# Patient Record
Sex: Female | Born: 1935 | Race: White | Hispanic: No | Smoking: Former smoker
Health system: Southern US, Community
[De-identification: ages and names within clinical notes are randomized; demographics above are authoritative.]

## PROBLEM LIST (undated history)

## (undated) DIAGNOSIS — E162 Hypoglycemia, unspecified: Secondary | ICD-10-CM

## (undated) DIAGNOSIS — I10 Essential (primary) hypertension: Secondary | ICD-10-CM

## (undated) DIAGNOSIS — IMO0002 Reserved for concepts with insufficient information to code with codable children: Secondary | ICD-10-CM

## (undated) DIAGNOSIS — C50919 Malignant neoplasm of unspecified site of unspecified female breast: Secondary | ICD-10-CM

## (undated) DIAGNOSIS — I480 Paroxysmal atrial fibrillation: Secondary | ICD-10-CM

---

## 2004-04-14 ENCOUNTER — Ambulatory Visit: Payer: Self-pay | Admitting: Internal Medicine

## 2005-04-18 ENCOUNTER — Ambulatory Visit: Payer: Self-pay | Admitting: Internal Medicine

## 2006-06-14 ENCOUNTER — Ambulatory Visit: Payer: Self-pay | Admitting: Internal Medicine

## 2007-04-04 DIAGNOSIS — IMO0001 Reserved for inherently not codable concepts without codable children: Secondary | ICD-10-CM

## 2007-04-04 HISTORY — PX: BREAST BIOPSY: SHX20

## 2007-04-04 HISTORY — DX: Reserved for inherently not codable concepts without codable children: IMO0001

## 2007-07-11 ENCOUNTER — Ambulatory Visit: Payer: Self-pay | Admitting: Internal Medicine

## 2007-07-19 ENCOUNTER — Ambulatory Visit: Payer: Self-pay | Admitting: Internal Medicine

## 2007-08-06 ENCOUNTER — Ambulatory Visit: Payer: Self-pay | Admitting: Surgery

## 2007-08-12 ENCOUNTER — Ambulatory Visit: Payer: Self-pay | Admitting: Surgery

## 2007-09-02 ENCOUNTER — Ambulatory Visit: Payer: Self-pay | Admitting: Oncology

## 2007-09-02 ENCOUNTER — Ambulatory Visit: Payer: Self-pay | Admitting: Radiation Oncology

## 2007-10-02 ENCOUNTER — Ambulatory Visit: Payer: Self-pay | Admitting: Oncology

## 2007-10-02 ENCOUNTER — Ambulatory Visit: Payer: Self-pay | Admitting: Radiation Oncology

## 2007-11-02 ENCOUNTER — Ambulatory Visit: Payer: Self-pay | Admitting: Oncology

## 2007-11-02 ENCOUNTER — Ambulatory Visit: Payer: Self-pay | Admitting: Radiation Oncology

## 2007-12-03 ENCOUNTER — Ambulatory Visit: Payer: Self-pay | Admitting: Oncology

## 2007-12-10 ENCOUNTER — Ambulatory Visit: Payer: Self-pay | Admitting: Oncology

## 2007-12-11 ENCOUNTER — Other Ambulatory Visit: Payer: Self-pay

## 2007-12-11 ENCOUNTER — Ambulatory Visit: Payer: Self-pay | Admitting: Ophthalmology

## 2007-12-23 ENCOUNTER — Ambulatory Visit: Payer: Self-pay | Admitting: Ophthalmology

## 2008-01-02 ENCOUNTER — Ambulatory Visit: Payer: Self-pay | Admitting: Oncology

## 2008-02-02 ENCOUNTER — Ambulatory Visit: Payer: Self-pay | Admitting: Oncology

## 2008-03-03 ENCOUNTER — Ambulatory Visit: Payer: Self-pay | Admitting: Oncology

## 2008-03-10 ENCOUNTER — Ambulatory Visit: Payer: Self-pay | Admitting: Oncology

## 2008-04-03 ENCOUNTER — Ambulatory Visit: Payer: Self-pay | Admitting: Oncology

## 2008-05-04 ENCOUNTER — Ambulatory Visit: Payer: Self-pay | Admitting: Oncology

## 2008-06-01 ENCOUNTER — Ambulatory Visit: Payer: Self-pay | Admitting: Oncology

## 2008-06-10 ENCOUNTER — Ambulatory Visit: Payer: Self-pay | Admitting: Oncology

## 2008-07-02 ENCOUNTER — Ambulatory Visit: Payer: Self-pay | Admitting: Oncology

## 2008-07-07 ENCOUNTER — Ambulatory Visit: Payer: Self-pay | Admitting: Ophthalmology

## 2008-07-13 ENCOUNTER — Ambulatory Visit: Payer: Self-pay | Admitting: Internal Medicine

## 2008-09-01 ENCOUNTER — Ambulatory Visit: Payer: Self-pay | Admitting: Oncology

## 2008-09-15 ENCOUNTER — Ambulatory Visit: Payer: Self-pay | Admitting: Oncology

## 2008-10-01 ENCOUNTER — Ambulatory Visit: Payer: Self-pay | Admitting: Oncology

## 2008-12-02 ENCOUNTER — Ambulatory Visit: Payer: Self-pay | Admitting: Oncology

## 2008-12-15 ENCOUNTER — Ambulatory Visit: Payer: Self-pay | Admitting: Oncology

## 2009-01-01 ENCOUNTER — Ambulatory Visit: Payer: Self-pay | Admitting: Oncology

## 2009-02-01 ENCOUNTER — Ambulatory Visit: Payer: Self-pay | Admitting: Oncology

## 2009-03-03 ENCOUNTER — Ambulatory Visit: Payer: Self-pay | Admitting: Oncology

## 2009-03-11 ENCOUNTER — Ambulatory Visit: Payer: Self-pay | Admitting: Oncology

## 2009-04-03 ENCOUNTER — Ambulatory Visit: Payer: Self-pay | Admitting: Oncology

## 2009-06-01 ENCOUNTER — Ambulatory Visit: Payer: Self-pay | Admitting: Oncology

## 2009-06-09 ENCOUNTER — Ambulatory Visit: Payer: Self-pay | Admitting: Oncology

## 2009-07-02 ENCOUNTER — Ambulatory Visit: Payer: Self-pay | Admitting: Oncology

## 2009-07-19 ENCOUNTER — Ambulatory Visit: Payer: Self-pay | Admitting: Surgery

## 2009-09-01 ENCOUNTER — Ambulatory Visit: Payer: Self-pay | Admitting: Oncology

## 2009-09-07 ENCOUNTER — Ambulatory Visit: Payer: Self-pay | Admitting: Oncology

## 2009-10-01 ENCOUNTER — Ambulatory Visit: Payer: Self-pay | Admitting: Oncology

## 2010-03-14 ENCOUNTER — Ambulatory Visit: Payer: Self-pay | Admitting: Oncology

## 2010-03-15 LAB — CANCER ANTIGEN 27.29: CA 27.29: 59.4 U/mL — ABNORMAL HIGH (ref 0.0–38.6)

## 2010-04-03 ENCOUNTER — Ambulatory Visit: Payer: Self-pay | Admitting: Oncology

## 2010-05-04 ENCOUNTER — Ambulatory Visit: Payer: Self-pay | Admitting: Oncology

## 2010-06-14 ENCOUNTER — Ambulatory Visit: Payer: Self-pay | Admitting: Oncology

## 2010-07-03 ENCOUNTER — Ambulatory Visit: Payer: Self-pay | Admitting: Oncology

## 2010-07-21 ENCOUNTER — Ambulatory Visit: Payer: Self-pay | Admitting: Surgery

## 2010-09-13 ENCOUNTER — Ambulatory Visit: Payer: Self-pay | Admitting: Oncology

## 2010-10-02 ENCOUNTER — Ambulatory Visit: Payer: Self-pay | Admitting: Oncology

## 2011-03-14 ENCOUNTER — Ambulatory Visit: Payer: Self-pay | Admitting: Oncology

## 2011-04-04 ENCOUNTER — Ambulatory Visit: Payer: Self-pay | Admitting: Oncology

## 2011-05-05 ENCOUNTER — Ambulatory Visit: Payer: Self-pay | Admitting: Oncology

## 2011-07-24 ENCOUNTER — Ambulatory Visit: Payer: Self-pay | Admitting: Oncology

## 2011-08-10 ENCOUNTER — Inpatient Hospital Stay: Payer: Self-pay | Admitting: Orthopedic Surgery

## 2011-08-10 LAB — URINALYSIS, COMPLETE
Bacteria: NONE SEEN
Bilirubin,UR: NEGATIVE
Hyaline Cast: 1
Nitrite: NEGATIVE
Ph: 5 (ref 4.5–8.0)
Protein: 30
RBC,UR: 3 /HPF (ref 0–5)
Squamous Epithelial: NONE SEEN
WBC UR: 1 /HPF (ref 0–5)

## 2011-08-10 LAB — COMPREHENSIVE METABOLIC PANEL
Albumin: 3.9 g/dL (ref 3.4–5.0)
Alkaline Phosphatase: 99 U/L (ref 50–136)
Anion Gap: 12 (ref 7–16)
Bilirubin,Total: 0.6 mg/dL (ref 0.2–1.0)
EGFR (African American): >60
Glucose: 141 mg/dL — ABNORMAL HIGH (ref 65–99)
Osmolality: 286 (ref 275–301)
Potassium: 4.4 mmol/L (ref 3.5–5.1)
SGPT (ALT): 33 U/L
Total Protein: 7.1 g/dL (ref 6.4–8.2)

## 2011-08-10 LAB — CBC
HCT: 37.5 % (ref 35.0–47.0)
HGB: 13.1 g/dL (ref 12.0–16.0)
MCHC: 34.8 g/dL (ref 32.0–36.0)
Platelet: 201 10*3/uL (ref 150–440)
RBC: 4.03 10*6/uL (ref 3.80–5.20)
WBC: 11.1 10*3/uL — ABNORMAL HIGH (ref 3.6–11.0)

## 2011-08-10 LAB — TROPONIN I: Troponin-I: <0.02 ng/mL

## 2011-08-11 LAB — CBC WITH DIFFERENTIAL/PLATELET
Basophil #: 0 10*3/uL (ref 0.0–0.1)
Basophil %: 0.3 %
Eosinophil #: 0.2 10*3/uL (ref 0.0–0.7)
HCT: 33.7 % — ABNORMAL LOW (ref 35.0–47.0)
HGB: 11.8 g/dL — ABNORMAL LOW (ref 12.0–16.0)
MCHC: 35.1 g/dL (ref 32.0–36.0)
MCV: 93 fL (ref 80–100)
Monocyte #: 0.5 x10 3/mm (ref 0.2–0.9)
Monocyte %: 6.6 %
Neutrophil #: 6.6 10*3/uL — ABNORMAL HIGH (ref 1.4–6.5)
Neutrophil %: 79.6 %
Platelet: 169 10*3/uL (ref 150–440)
RBC: 3.62 10*6/uL — ABNORMAL LOW (ref 3.80–5.20)
RDW: 13.1 % (ref 11.5–14.5)
WBC: 8.3 10*3/uL (ref 3.6–11.0)

## 2011-08-11 LAB — BASIC METABOLIC PANEL
Chloride: 105 mmol/L (ref 98–107)
Co2: 28 mmol/L (ref 21–32)
Creatinine: 0.6 mg/dL (ref 0.60–1.30)
EGFR (Non-African Amer.): >60
Osmolality: 278 (ref 275–301)

## 2011-08-12 LAB — CBC WITH DIFFERENTIAL/PLATELET
Basophil #: 0 10*3/uL (ref 0.0–0.1)
Eosinophil #: 0.4 10*3/uL (ref 0.0–0.7)
Eosinophil %: 5.5 %
HCT: 25.7 % — ABNORMAL LOW (ref 35.0–47.0)
Lymphocyte %: 22.6 %
MCH: 32.3 pg (ref 26.0–34.0)
MCHC: 34.5 g/dL (ref 32.0–36.0)
MCV: 94 fL (ref 80–100)
Monocyte #: 0.6 x10 3/mm (ref 0.2–0.9)
Monocyte %: 8.8 %
Neutrophil %: 62.4 %
Platelet: 158 10*3/uL (ref 150–440)
RBC: 2.74 10*6/uL — ABNORMAL LOW (ref 3.80–5.20)
RDW: 12.7 % (ref 11.5–14.5)

## 2011-08-12 LAB — BASIC METABOLIC PANEL
Anion Gap: 8 (ref 7–16)
BUN: 14 mg/dL (ref 7–18)
Chloride: 101 mmol/L (ref 98–107)
Co2: 27 mmol/L (ref 21–32)
Creatinine: 0.69 mg/dL (ref 0.60–1.30)
EGFR (African American): >60
Glucose: 103 mg/dL — ABNORMAL HIGH (ref 65–99)
Osmolality: 273 (ref 275–301)
Potassium: 3.9 mmol/L (ref 3.5–5.1)

## 2011-08-13 LAB — CBC WITH DIFFERENTIAL/PLATELET
Basophil #: 0 10*3/uL (ref 0.0–0.1)
Eosinophil #: 0.3 10*3/uL (ref 0.0–0.7)
HGB: 8 g/dL — ABNORMAL LOW (ref 12.0–16.0)
Lymphocyte %: 18 %
MCH: 32.3 pg (ref 26.0–34.0)
MCHC: 34.3 g/dL (ref 32.0–36.0)
MCV: 94 fL (ref 80–100)
Monocyte %: 8.9 %
Neutrophil %: 66.2 %
RBC: 2.48 10*6/uL — ABNORMAL LOW (ref 3.80–5.20)
WBC: 5.4 10*3/uL (ref 3.6–11.0)

## 2011-08-14 LAB — CBC WITH DIFFERENTIAL/PLATELET
Basophil #: 0 10*3/uL (ref 0.0–0.1)
Basophil %: 0.5 %
Eosinophil #: 0.3 10*3/uL (ref 0.0–0.7)
Eosinophil %: 5.4 %
HCT: 24.4 % — ABNORMAL LOW (ref 35.0–47.0)
HGB: 8.5 g/dL — ABNORMAL LOW (ref 12.0–16.0)
Lymphocyte #: 1.4 10*3/uL (ref 1.0–3.6)
Lymphocyte %: 24.6 %
MCHC: 34.8 g/dL (ref 32.0–36.0)
MCV: 94 fL (ref 80–100)
Monocyte #: 0.5 x10 3/mm (ref 0.2–0.9)
Monocyte %: 8.8 %
Neutrophil #: 3.5 10*3/uL (ref 1.4–6.5)
Neutrophil %: 60.7 %
Platelet: 224 10*3/uL (ref 150–440)
RDW: 13.3 % (ref 11.5–14.5)

## 2011-08-14 LAB — BASIC METABOLIC PANEL
Chloride: 102 mmol/L (ref 98–107)
Co2: 29 mmol/L (ref 21–32)
Creatinine: 0.51 mg/dL — ABNORMAL LOW (ref 0.60–1.30)
EGFR (African American): >60
EGFR (Non-African Amer.): >60
Osmolality: 274 (ref 275–301)
Potassium: 4.1 mmol/L (ref 3.5–5.1)
Sodium: 138 mmol/L (ref 136–145)

## 2011-08-15 LAB — CBC WITH DIFFERENTIAL/PLATELET
Eosinophil %: 4.9 %
HGB: 9.7 g/dL — ABNORMAL LOW (ref 12.0–16.0)
MCH: 32 pg (ref 26.0–34.0)
MCV: 95 fL (ref 80–100)
Monocyte #: 0.7 x10 3/mm (ref 0.2–0.9)
Monocyte %: 8.1 %
Neutrophil #: 5.3 10*3/uL (ref 1.4–6.5)
Neutrophil %: 59.7 %
Platelet: 337 10*3/uL (ref 150–440)
WBC: 9 10*3/uL (ref 3.6–11.0)

## 2011-08-30 LAB — URINE CULTURE

## 2012-04-03 ENCOUNTER — Ambulatory Visit: Payer: Self-pay | Admitting: Oncology

## 2012-05-04 ENCOUNTER — Ambulatory Visit: Payer: Self-pay | Admitting: Oncology

## 2012-06-01 ENCOUNTER — Ambulatory Visit: Payer: Self-pay | Admitting: Oncology

## 2012-07-24 ENCOUNTER — Ambulatory Visit: Payer: Self-pay | Admitting: Oncology

## 2012-11-01 ENCOUNTER — Ambulatory Visit: Payer: Self-pay | Admitting: Oncology

## 2012-11-20 LAB — CANCER ANTIGEN 27.29: CA 27.29: 35.5 U/mL (ref 0.0–38.6)

## 2012-12-02 ENCOUNTER — Ambulatory Visit: Payer: Self-pay | Admitting: Oncology

## 2013-04-29 IMAGING — CR RIGHT HIP - 1 VIEW
1 series · 2 of 2 positions shown · non-contrast
Comparison: none

REASON FOR EXAM: lateral of right hip.  Need to see the femoral head
around TIP of screw.  Previo
COMMENTS:

[Series 9: w hip lat · 0.14mm/px · 2 of 2 slices shown]
[im 1/2]
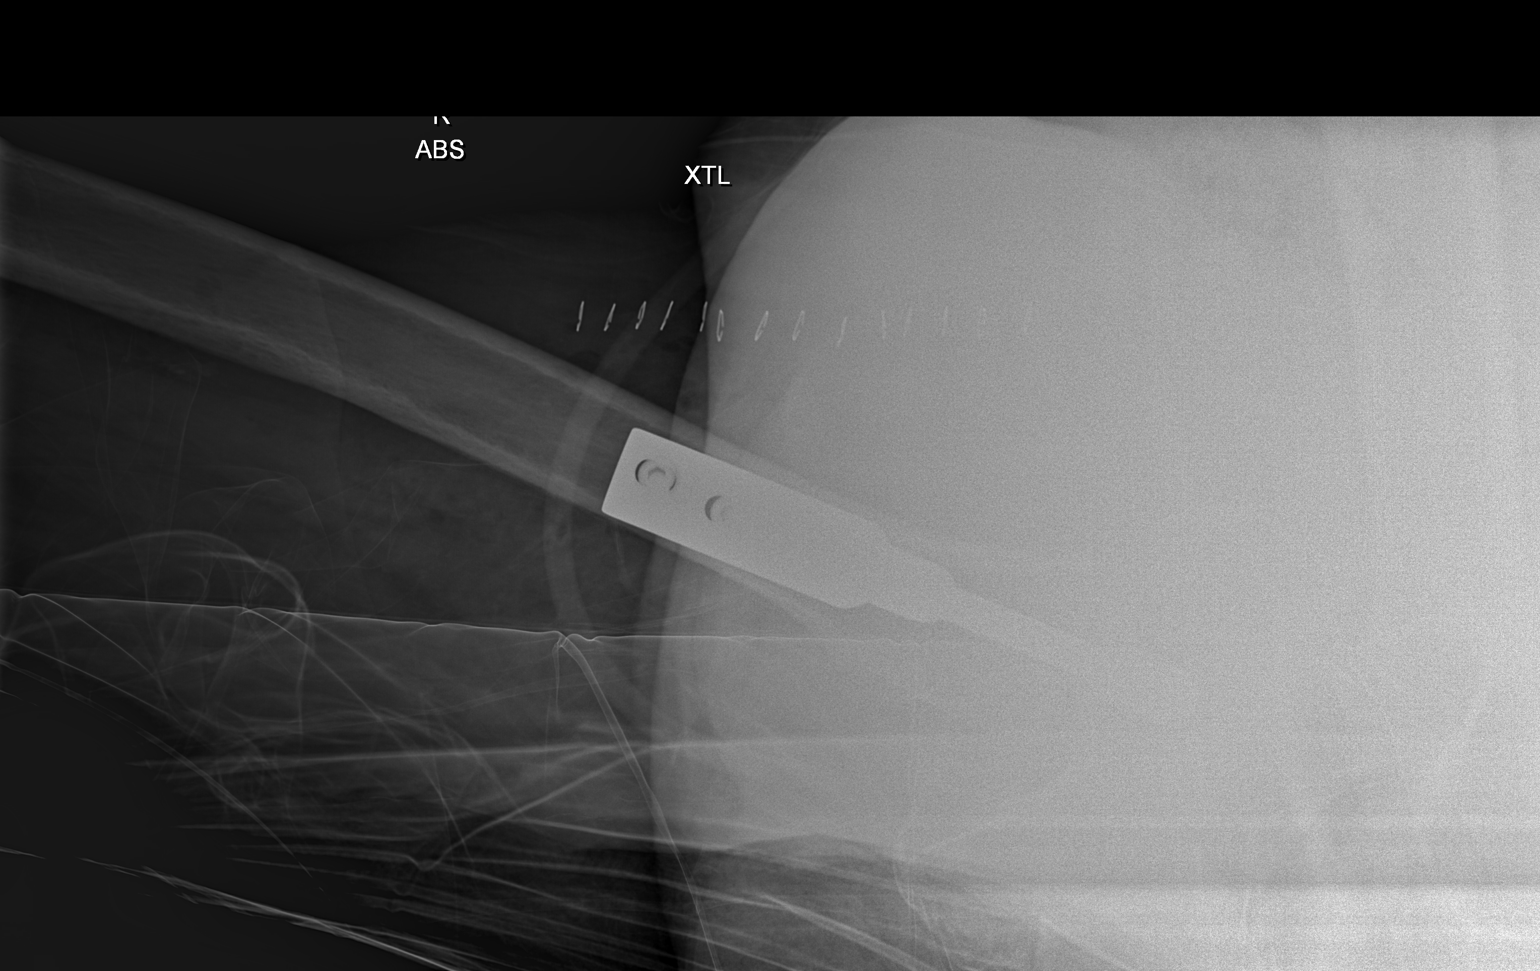
[im 2/2]
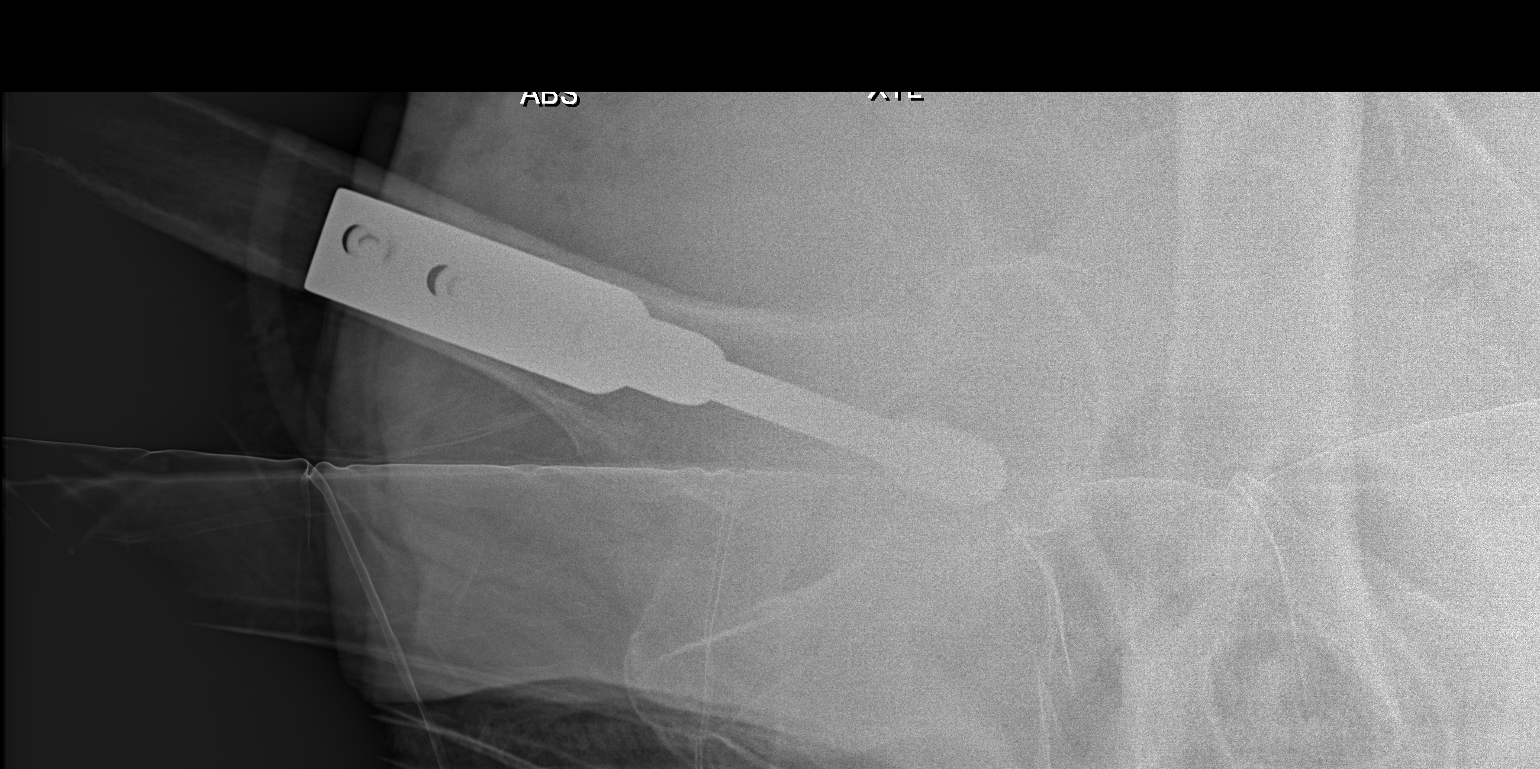

[2 of 2 positions shown; findings below may reference images not displayed]

PROCEDURE:     DXR - DXR HIP RIGHT ONE VIEW  - August 11, 2011  [DATE]

RESULT:     Crosstable lateral view of the right knee demonstrates the
patient is status post arthroplasty. Surgical skin staples are present. The
crosstable lateral images show no definite immediate postoperative bone or
hardware complication. The femoral head component is slightly posterior from
midline on this projection.
IMPRESSION: Please see above.

[REDACTED]

## 2013-04-29 IMAGING — CR DG C-ARM 1-60 MIN
1 series · 3 of 3 positions shown · non-contrast
Comparison: none

REASON FOR EXAM: ORIF
COMMENTS:

PROCEDURE:     DXR - DXR C-ARM WITH 2 VIEWS RT HIP  - August 11, 2011  [DATE]
RESULT:     Comparison: AP pelvis 08/10/2011

[Series 6001: (person_name) · 3 of 3 slices shown]
[im 1/3]
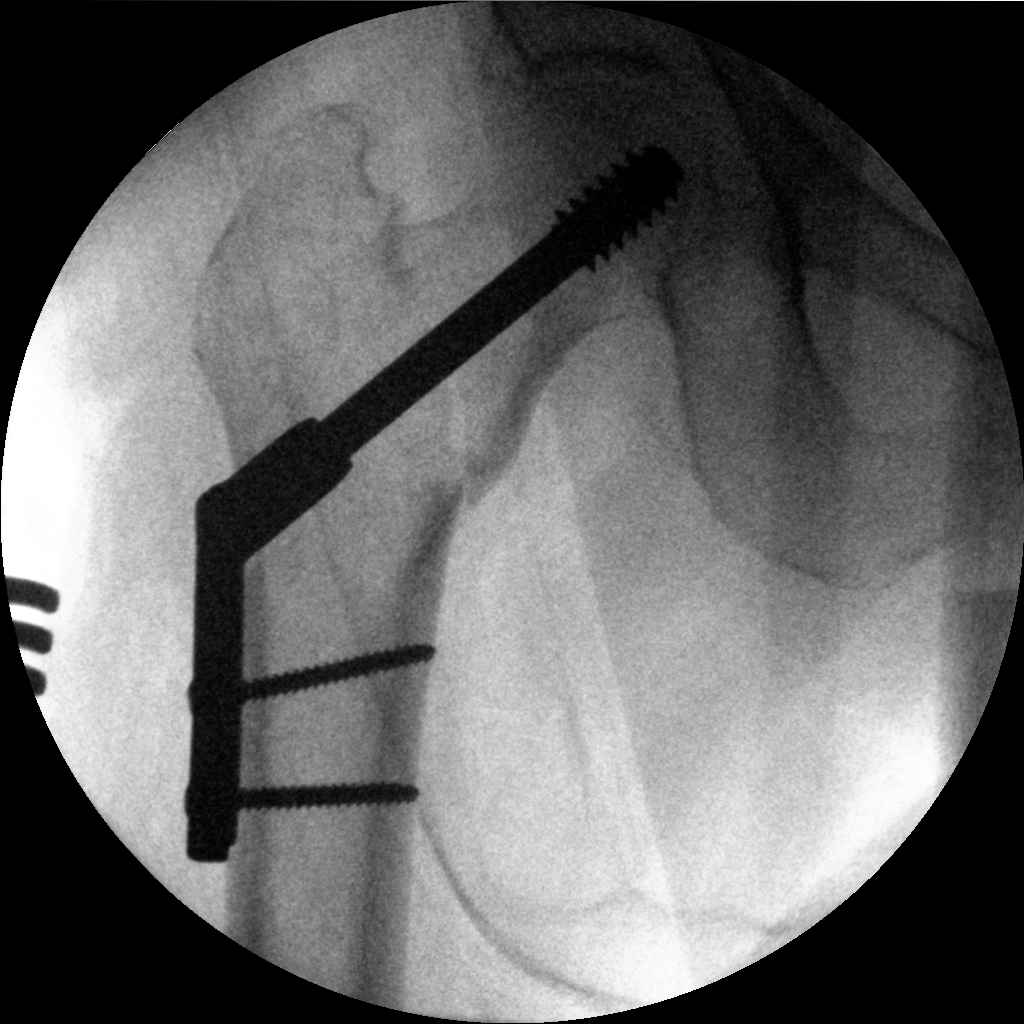
[im 2/3]
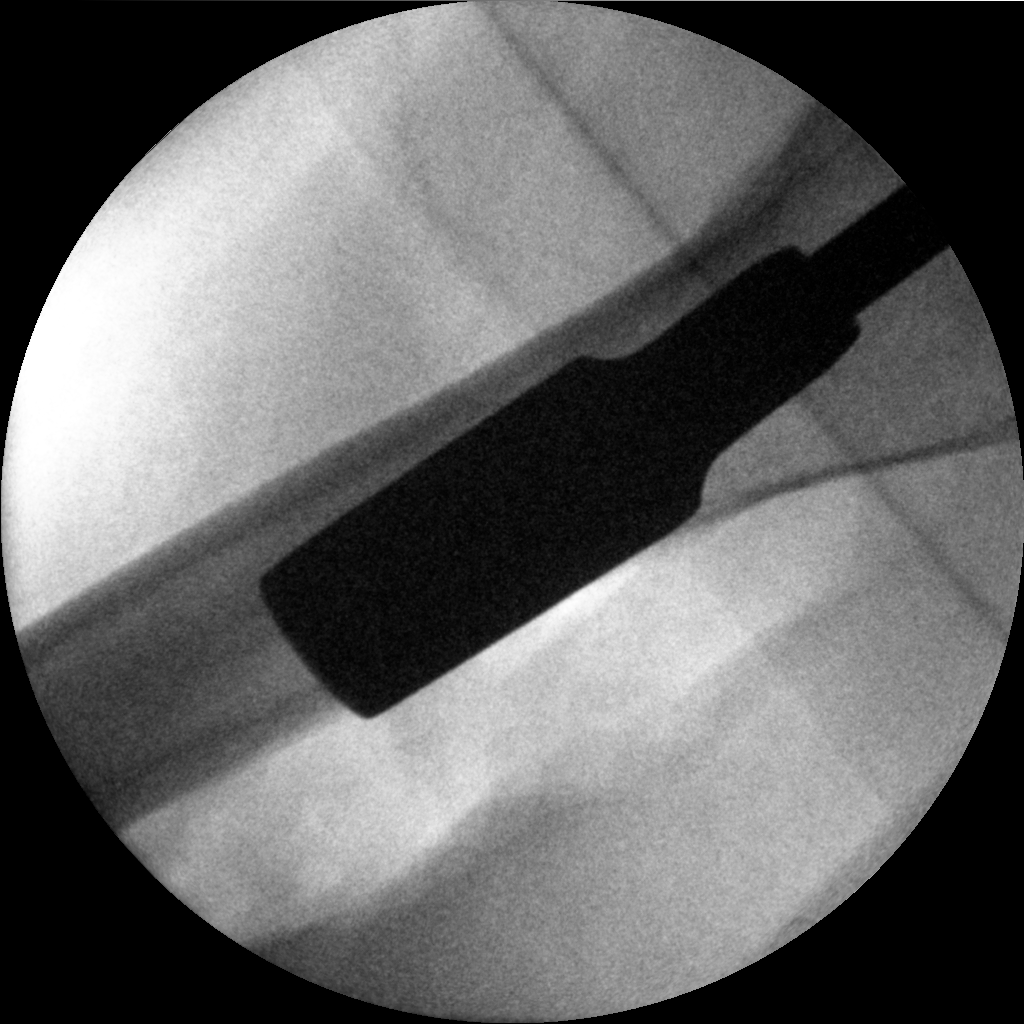
[im 3/3]
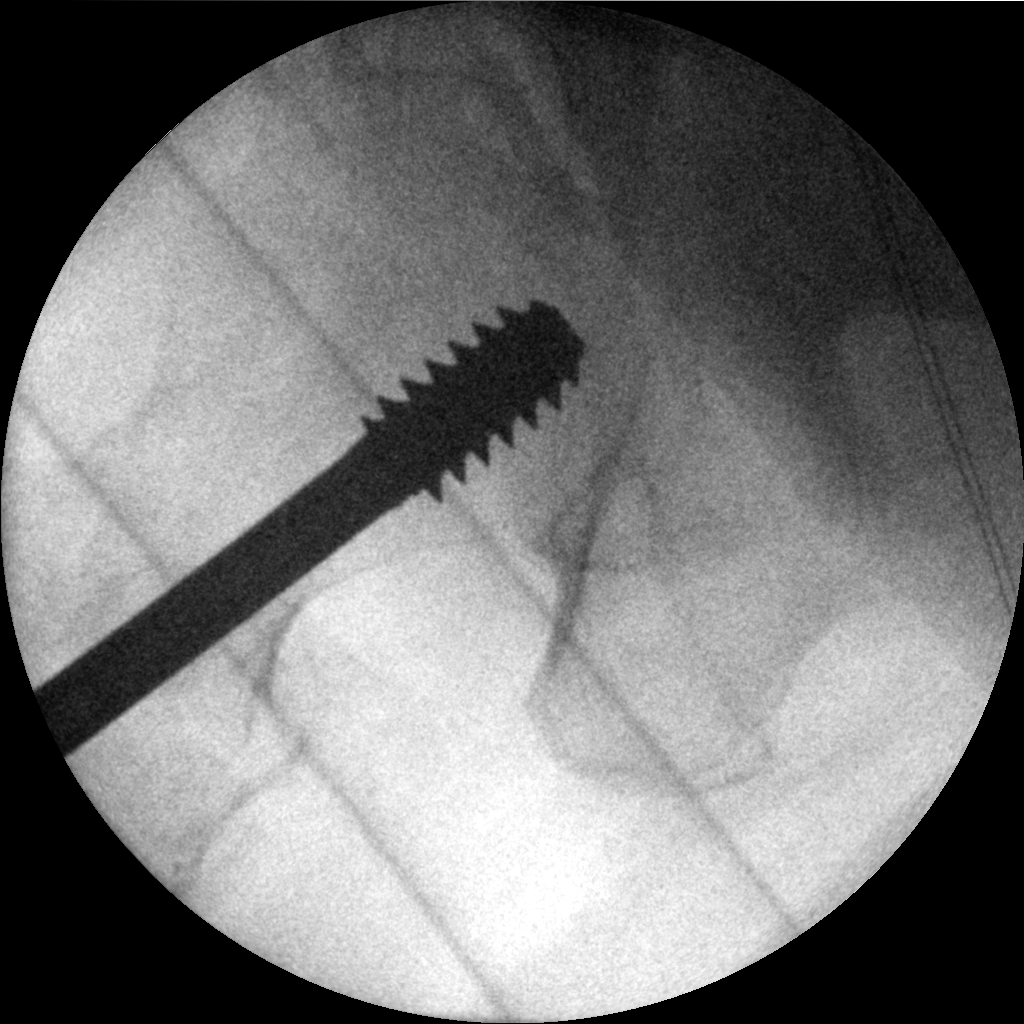

[3 of 3 positions shown; findings below may reference images not displayed]

FINDINGS: Three fluoroscopic images were submitted from intraoperative procedure.
Dynamic compression screw and cortical fixation plate and screws are seen
traversing the intertrochanteric fracture of the right hip. There is
improved alignment of the fracture. The remainder of the interpretation is
deferred to the performing clinician.
IMPRESSION: Please see above.

## 2013-04-29 IMAGING — CR RIGHT HIP - 1 VIEW
1 series · 1 of 1 positions shown · non-contrast
Comparison: none

REASON FOR EXAM: post op
COMMENTS:   Bedside (portable):Y

PROCEDURE:     DXR - DXR HIP RIGHT ONE VIEW  - August 11, 2011  [DATE]
RESULT:     Comparison: Right hip radiograph 08/10/2011

[lat]
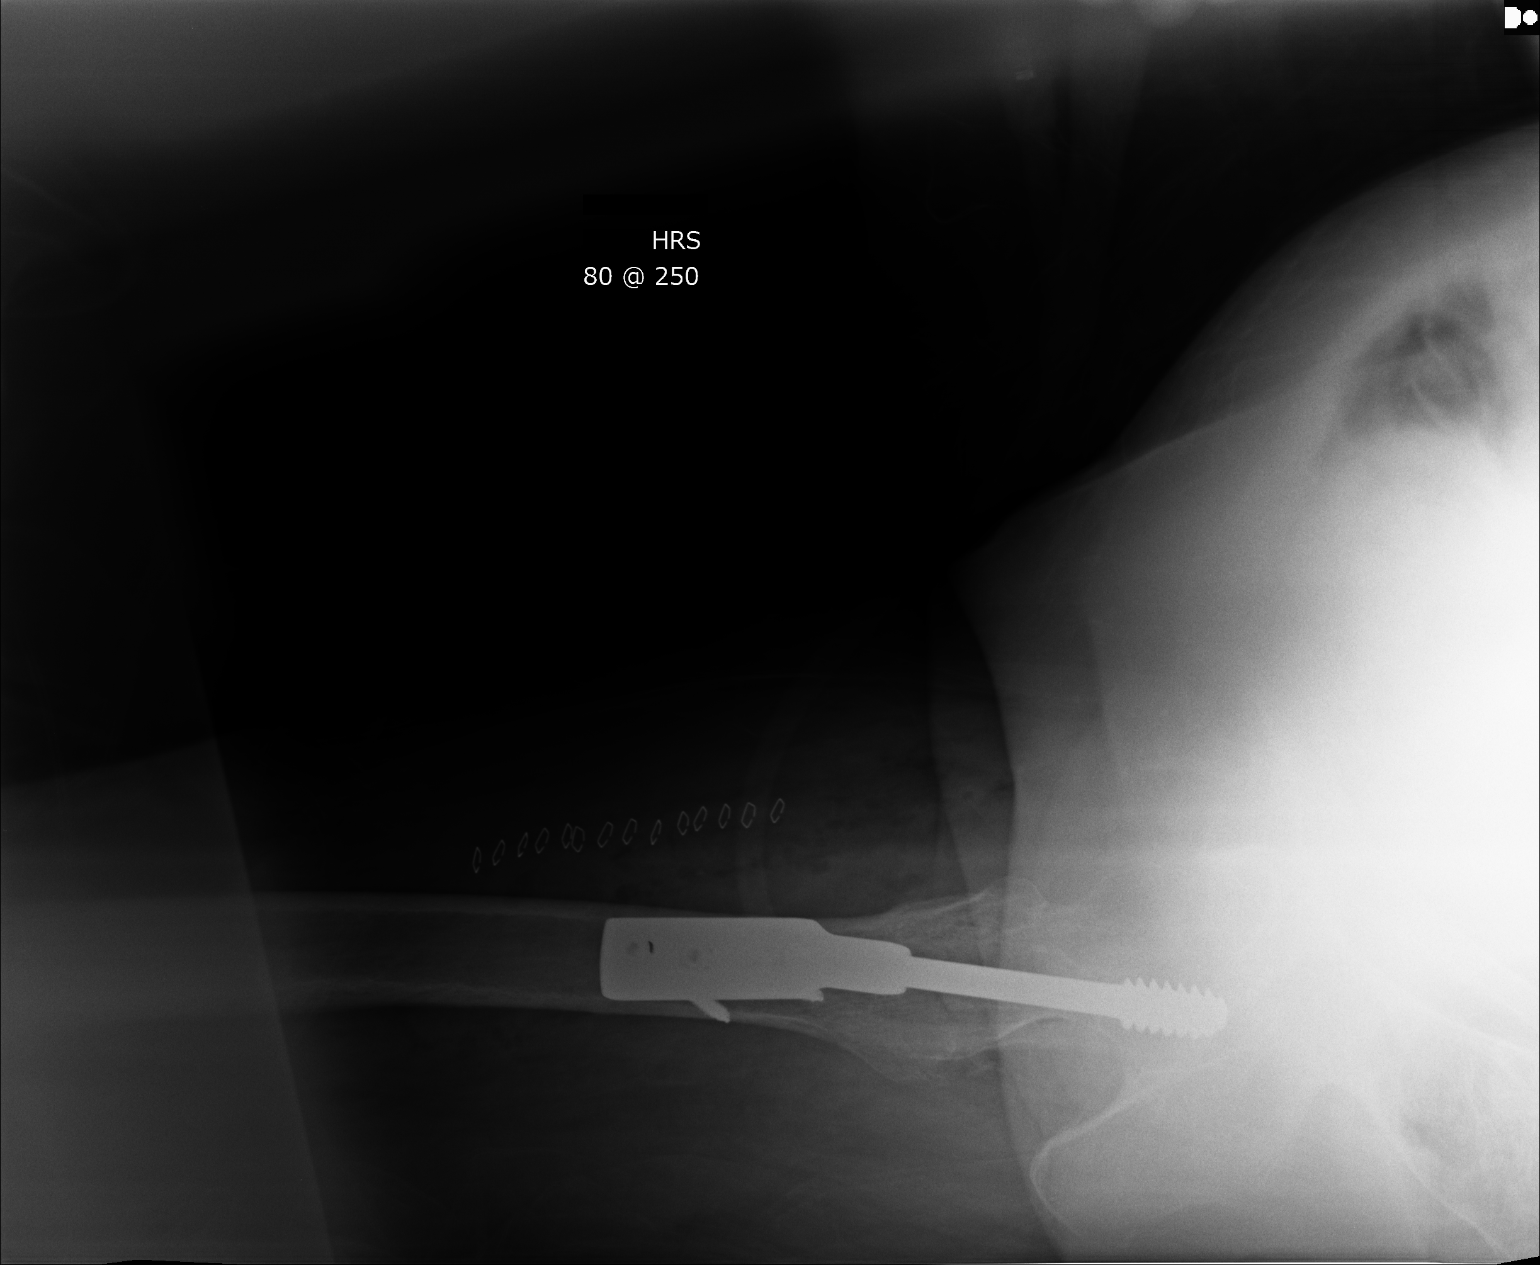

[1 of 1 positions shown; findings below may reference images not displayed]

FINDINGS: Evaluation limited by single view. Dynamic compression screw and cortical
fixation plate and screws seen traversing the femoral neck. Skin staples are
present. Subcutaneous gas is likely postoperative.
IMPRESSION: ORIF of right hip fracture.

## 2013-07-31 ENCOUNTER — Ambulatory Visit: Payer: Self-pay | Admitting: Oncology

## 2013-11-27 ENCOUNTER — Ambulatory Visit: Payer: Self-pay | Admitting: Oncology

## 2013-11-28 LAB — CANCER ANTIGEN 27.29: CA 27.29: 45.5 U/mL — AB (ref 0.0–38.6)

## 2013-12-02 ENCOUNTER — Ambulatory Visit: Payer: Self-pay | Admitting: Oncology

## 2014-02-19 ENCOUNTER — Ambulatory Visit: Payer: Self-pay | Admitting: Oncology

## 2014-07-17 ENCOUNTER — Other Ambulatory Visit: Payer: Self-pay | Admitting: Internal Medicine

## 2014-07-17 DIAGNOSIS — Z853 Personal history of malignant neoplasm of breast: Secondary | ICD-10-CM

## 2014-07-26 NOTE — H&P (Signed)
Subjective/Chief Complaint Right hip pain    History of Present Illness 81 y/o female presented to the [facility] with right hip pain after falling while trying to use the toilet.  She was unable to stand or bear weight on the right lower leg following the injury.  She denies syncopy or prodromal symptoms.  She denies loss of consciousness or other injuries.  She is an independent ambulator at baseline.   Past Med/Surgical Hx:  Glaucoma:   HTN:   Arthritis:   Diverticulitis:   Breast Cancer:   Hysterectomy:   Colostomy, has been reversed:   ALLERGIES:  Fosamax: Chest Pain, Other  HOME MEDICATIONS: Medication Instructions Status  letrozole tablet 2.5 mg 1 tab(s) orally once a day x 90 days  Active  Peridin-C 1 tab(s)  once a day  Active  Vicks Vapor Rub  1X   as needed at bedtime   Active  Tylenol Extra Strength 2 tab(s)  every 6 hours as needed   Active  Stanback Analgesic 1X  once a day (in the morning)  Active  Aspirin 325 mg.  1 tab(s)  once a day  Active  Spectravite MVI  1 tab(s)  once a day  Active  Calcium 600+D 2 tab(s)  once a day  Active  timolol ophthalmic 0.5% solution 1 gtt drops to each eye once a day (at bedtime)  Active  enalapril 10 mg oral tablet 1 tab(s) orally once a day  Active   Family and Social History:   Family History Non-Contributory    Place of Living Home   Review of Systems:   Subjective/Chief Complaint Right hip pain    Medications/Allergies Reviewed Medications/Allergies reviewed   Physical Exam:   GEN no acute distress    HEENT PERRL, hearing intact to voice, dry oral mucosa, Oropharynx clear    NECK supple  No masses  trachea midline    RESP normal resp effort  clear BS  no use of accessory muscles    CARD regular rate  no murmur  no JVD    ABD denies tenderness  no liver/spleen enlargement  soft  normal BS    GU foley catheter in place    LYMPH negative neck    EXTR negative cyanosis/clubbing, negative edema, Skin intact over  right hip.  No erythema or ecchymosis.  Right leg in Buck's traction.  Motor and sensory function of the right lower extremity intact.  Pedal pulses are palpable.    SKIN normal to palpation    NEURO motor/sensory function intact    PSYCH alert   Routine Chem:  09-May-13 01:48    Glucose, Serum 141   BUN 30   Creatinine (comp) 0.74   Sodium, Serum 139   Potassium, Serum 4.4   Chloride, Serum 103   CO2, Serum 24   Calcium (Total), Serum 10.1  Hepatic:  09-May-13 01:48    Bilirubin, Total 0.6   Alkaline Phosphatase 99   SGPT (ALT) 33   SGOT (AST) 42   Total Protein, Serum 7.1   Albumin, Serum 3.9  Routine Chem:  09-May-13 01:48    Osmolality (calc) 286   eGFR (African American) >60   eGFR (Non-African American) >60   Anion Gap 12  Routine Hem:  09-May-13 01:48    WBC (CBC) -   RBC (CBC) -   Hemoglobin (CBC) -   Hematocrit (CBC) -   Platelet Count (CBC) -   MCV -   MCH -  MCHC -   RDW -  Cardiac:  09-May-13 01:48    Troponin I < 0.02  Routine BB:  09-May-13 01:48    Crossmatch Unit 1 Ready   Crossmatch Unit 2 Ready   Antibody Screen NEGATIVE  Routine Hem:  09-May-13 02:26    WBC (CBC) 11.1   RBC (CBC) 4.03   Hemoglobin (CBC) 13.1   Hematocrit (CBC) 37.5   Platelet Count (CBC) 201   MCV 93   MCH 32.3   MCHC 34.8   RDW 13.1  Routine UA:  09-May-13 02:26    Color (UA) Yellow   Clarity (UA) Clear   Glucose (UA) Negative   Bilirubin (UA) Negative   Ketones (UA) Negative   Specific Gravity (UA) 1.025   Blood (UA) Negative   pH (UA) 5.0   Protein (UA) 30 mg/dL   Nitrite (UA) Negative   Leukocyte Esterase (UA) Negative   RBC (UA) 3 /HPF   WBC (UA) 1 /HPF   Mucous (UA) PRESENT   Hyaline Cast (UA) 1 /LPF   Calcium Oxalate Crystal (UA) PRESENT  Blood Glucose:  09-May-13 10:06    POCT Blood Glucose 90   Radiology Results: XRay:    09-May-13 01:51, Chest 1 View AP or PA   Chest 1 View AP or PA   REASON FOR EXAM:    fx hip  COMMENTS:   May  transport without cardiac monitor    PROCEDURE: DXR - DXR CHEST 1 VIEWAP OR PA  - Aug 10 2011  1:51AM     RESULT: The lungs are clear. The cardiovascular structures are   unremarkable.    IMPRESSION:  No acute abnormality.      Thank you for this opportunity to contribute to the care of your patient.           Verified By: Osa Craver, M.D., MD    09-May-13 01:51, Knee Right AP and Lateral   Knee Right AP and Lateral   REASON FOR EXAM:    fall / rotation  COMMENTS:       PROCEDURE: DXR - DXR KNEE RIGHT AP AND LATERAL  - Aug 10 2011  1:51AM     RESULT: Degenerative changes are noted of the right knee. No evidence of   fracture. Loose bodies may be present. Vascular calcification is noted.     IMPRESSION:  Degenerative changes are noted of the right knee. No acute   abnormality.      Thank you for this opportunity to contribute to the care of your patient.         Verified By: Osa Craver, M.D., MD     Assessment/Admission Diagnosis Right hip intertrochanteric fracture    Plan I explained the diagnosis to the patient and her sister and brother in law who are at the bedside.  I drew a diagram on the white board in the room to help explain the fracture.  I have recommended an ORIF of the right hip fracture.  I explained the surgery in detail to the patient and the post-op course.  The risks and benefits of surgical intervention were discussed in detail with the patient. The patient expressed understanding of the risks and benefits and agreed with plans for surgery.  The risks include, but are not limited to: infection, bleeding requiring transfusion, nerve and blood vessel injury, leg length discrepancy, change in leg roation, malunion, nonunion, hardware failure, persistent pain, need for more surgery including conversion to a  total hip arthroplasty, DVT, and PE, MI, stroke, pneumonia, respiratory failure, and death.  I answered all the patient's questions.  She will  be NPO after midnight.  Patient had been cleared for surgery by the [facility] medical service.  Surgical site signed as per "right site surgery" protocol.  Surgery is scheduled for 10:30 AM tomorrow morning.   Electronic Signatures: Thornton Park (MD)  (Signed 09-May-13 19:05)  Authored: CHIEF COMPLAINT and HISTORY, PAST MEDICAL/SURGIAL HISTORY, ALLERGIES, HOME MEDICATIONS, FAMILY AND SOCIAL HISTORY, REVIEW OF SYSTEMS, PHYSICAL EXAM, LABS, Radiology, ASSESSMENT AND PLAN   Last Updated: 09-May-13 19:05 by Thornton Park (MD)

## 2014-07-26 NOTE — Op Note (Signed)
PATIENT NAME:  Virginia Clements, Virginia Clements MR#:  [id number] DATE OF BIRTH:  [birth year]  DATE OF PROCEDURE:  08/11/2011  PREOPERATIVE DIAGNOSIS: Right intertrochanteric hip fracture.   POSTOPERATIVE DIAGNOSIS: Right intertrochanteric hip fracture.    PROCEDURE: Open reduction and internal fixation right intertrochanteric hip fracture.   SURGEON: Timoteo Gaul, MD  ANESTHESIA: General.  COMPLICATIONS: None.   ESTIMATED BLOOD LOSS: 150 mL.   INDICATIONS FOR PROCEDURE: Patient is a 81 year old female who is an independent ambulator at baseline. She sustained a fall while using the bathroom at home. Given her high level of functioning and the displacement of her fracture it was recommended that the patient undergo open reduction internal fixation. I reviewed the risks and benefits of surgery with the patient. She agreed to the procedure. She understands the risks include infection, bleeding requiring blood transfusion, nerve or blood vessel injury, change in leg lengths, change in lower leg rotation, failure of the hardware, painful hardware, persistent pain in the hip as well as screw cut out and the need for further surgery. Medical complications include deep vein thrombosis and pulmonary embolism, myocardial infarction, stroke, pneumonia, respiratory failure, and death.   DESCRIPTION OF PROCEDURE: Patient is marked over the right hip with the word "yes" according the hospital's right site protocol. She was then brought to the Operating Room where she laid supine on the fracture table and underwent general endotracheal intubation. Her right leg was placed into traction on the fracture table and the left leg was placed in the hemi-lithotomy position. All bony prominences were adequately padded. Patient prepped and draped in sterile fashion.   A timeout was performed to verify the patient's name, date of birth, medical record number, correct site of surgery and correct procedure to be performed. It  was also used to verify the patient had received antibiotics and that all appropriate instruments, implants, and radiographic studies were available in the room. Once all in attendance were in agreement, the case began.   Intraoperative C-arm fluoroscopy was used to reduce the fracture. Patient had traction and internal rotation applied to the left leg to reduce the fracture. A lateral incision was made over the area of the greater trochanter in line with the femur. The fascia lata was then sharply incised with a deep #10 blade and the vastus lateralis muscle was bluntly split to reveal the underlying lateral cortex. A 130 degree guide was then used to drill a guidepin from the lateral cortex across the fracture and into the femoral head. Its position was checked on AP and lateral C-arm images. Once adequate position was achieved this was overdrilled with a long barrel drill. The length of the lag screw was 95 mm in length. The 95 mm lag screw was assembled with a 130 degree two hole sideplate. This was then advanced by hand into position in the subcortical bone. The AP and lateral images of the lag screw in the femoral head were performed. Adequate position of the lag screw was achieved. The sideplate was then brought into position along the lateral cortex of the femur and gently malleted into place. Two bicortical 3.5 mm screws were then placed through each hole of the two hold sideplate to affix it to the femur. A compression screw was then screwed into the barrel of the lag screw as traction was released. This allowed for compression at the fracture site. Final C-arm images were taken. The wound was copiously irrigated. Interrupted 0 Vicryl was used to repair the fascia lata and  the subcutaneous tissue closed with 2-0 Vicryl. The skin was approximated with skin staples. A dry sterile dressing was applied. The patient was then transferred to the hospital bed after being extubated. She was in stable condition. I  was scrubbed and present for the entire case and all sharp and instrument counts were correct at the conclusion of the case. I met with the patient's family postoperatively to let them know the case had gone without complication. The patient was stable in recovery room.   ____________________________ Timoteo Gaul, MD klk:cms D: 08/18/2011 12:34:39 ET T: 08/18/2011 13:00:26 ET JOB#: [id number]  cc: Timoteo Gaul, MD, <Dictator> Timoteo Gaul MD ELECTRONICALLY SIGNED 08/22/2011 10:46

## 2014-07-26 NOTE — Consult Note (Signed)
PATIENT NAME:  ALISSIA, LORY MR#:  [id number] DATE OF BIRTH:  [birth year]  DATE OF CONSULTATION:  08/10/2011  REFERRING PHYSICIAN:  Timoteo Gaul, MD  CONSULTING PHYSICIAN:  Clovis Pu. Lenore Manner, MD  CHIEF COMPLAINT: Medical evaluation and follow-up for a patient who had a hip fracture.   HISTORY OF PRESENT ILLNESS: Ms. Terry is a 81 year old pleasant Caucasian female who was in her usual state of health until today while she was on the commode when she was trying to get up, it was down slope, and she fell resulting in right intertrochanteric hip fracture. The patient was admitted to the hospital under the Orthopedic team, and they requested Medical consult. The patient otherwise is healthy. She has no prior history of coronary artery disease, no history of stroke, no diabetes.    REVIEW OF SYSTEMS: CONSTITUTIONAL: Denies any fever. No chills. No night sweats. No fatigue. EYES: No blurring of vision. No double vision. ENT: No hearing impairment. No sore throat. No dysphagia. CARDIOVASCULAR: No chest pain. No shortness of breath. No edema. No syncope. No palpitations. RESPIRATORY: No cough. No sputum production. No chest pain. No shortness of breath. GASTROINTESTINAL: No abdominal pain. No vomiting, no diarrhea. GENITOURINARY: No dysuria or frequency of urination. MUSCULOSKELETAL: No joint pain or swelling other than the right hip pain after her fall. No muscular pain or swelling. INTEGUMENTARY: No skin rash. No ulcers. NEUROLOGY: No focal weakness. No seizure activity. No headache. PSYCHIATRY: No anxiety. No depression. ENDOCRINE: No polyuria or polydipsia. No heat or cold intolerance.   PAST MEDICAL HISTORY:  1. History of diverticulitis complicated by having a permanent colostomy. She had it for years.  2. Systemic hypertension. 3. Osteoarthritis. 4. Glaucoma.   PAST SURGICAL HISTORY:  1. Cataract surgery. 2. Colostomy after complications of diverticulitis.   SOCIAL HABITS:  Nonsmoker. There is a remote history of smoking. She quit in 1973. The patient denies alcoholism. She used to be alcoholic but she quit in 3235.  SOCIAL HISTORY: She is widowed, lives at home alone.   FAMILY HISTORY: She reported that her mother and all her sisters have diabetes mellitus.   ADMISSION MEDICATIONS:  1. Enalapril 10 mg a day.  2. Letrozole 2.5 mg once a day.  3. Timolol 0.5%, 1 drop in each eye at bedtime. 4. Aspirin 325 mg a day. 5. Tylenol p.r.n.  6. Vitamin D 1000 units.   ALLERGIES: No known drug allergies, but in her records she is allergic to Fosamax. When I inquired about  that, she reports it was GI side effects but not true allergy.   PHYSICAL EXAMINATION:  VITAL SIGNS: Blood pressure 131/74, respiratory rate 20, pulse 70, temperature 98, oxygen saturation 92%.   GENERAL APPEARANCE: An elderly female, healthy looking, in no acute distress.   HEENT: Head: No pallor. No icterus. No cyanosis. Ears, nose and throat: Hearing was normal. Nasal mucosa, lips, and tongue were normal. Eyes: Examination revealed normal eyes and conjunctiva. Pupils were slightly irregular. I did not detect reactivity to light.   NECK: Supple. Trachea at midline. No thyromegaly. No cervical lymphadenopathy. No masses.   HEART: Exam revealed normal S1, S2. No S3, S4. No murmur. No gallop. No carotid bruits.   RESPIRATORY: Normal breathing pattern without use of accessory muscles. No rales. No wheezing.   ABDOMEN: Soft without tenderness. No hepatosplenomegaly. No masses.   SKIN: No ulcers. No subcutaneous nodules.   MUSCULOSKELETAL: No joint swelling. No clubbing. The right leg is placed on traction.   NEUROLOGIC:  Cranial nerves II through XII are intact. No focal motor deficit.  PSYCHIATRIC: The patient is alert and oriented x3. Mood and affect were normal.   LABORATORY, DIAGNOSTIC AND RADIOLOGICAL DATA:  EKG showed normal sinus rhythm at rate of 74 per minute. Normal EKG. Serum  sodium was 139, potassium 4.4, glucose 141, BUN 30, creatinine 0.7.  Liver function tests were normal. There was slight elevation of AST at 42.  Troponin normal at less than 0.02.  CBC showed a white count of 11,000, hemoglobin 13, hematocrit 37, platelet count 201. Urinalysis was unremarkable.   IMPRESSION:  1. Right intertrochanteric fracture, status post fall.  2. Systemic hypertension.  3. Osteoarthritis. 4. Diverticulosis with history of complications of diverticulitis and with colostomy, and then later it was closed.  5. Breast cancer, status post radiation therapy. 6. Glaucoma.  7. History of cataract surgery.   IMPRESSION: The patient is at mild-to-moderate risk for her surgery. I do not see any contraindication and may proceed for the surgical plan. May resume her enalapril and the aspirin, along with her eye drops and the Letrozole.    ____________________________ Clovis Pu. Lenore Manner, MD amd:cbb D: 08/10/2011 06:26:38 ET T: 08/10/2011 11:44:16 ET JOB#: [id number]  cc: Clovis Pu. Lenore Manner, MD, <Dictator> Mike Craze Irven Coe MD ELECTRONICALLY SIGNED 08/18/2011 2:54

## 2014-07-26 NOTE — Discharge Summary (Signed)
PATIENT NAME:  Virginia Clements, Virginia Clements MR#:  [id number] DATE OF BIRTH:  1930/01/25  DATE OF ADMISSION:  08/10/2011 DATE OF DISCHARGE:  08/15/2011  ADMITTING DIAGNOSIS: Right intertrochanteric hip fracture.   HISTORY OF PRESENT ILLNESS: Virginia Clements is a 81 year old female who presented to [facility] ER with right hip pain after a fall using the toilet at home. She fell trying to get off the toilet. She was unable to stand or bear weight on the right lower extremity following this injury. She denied any syncope or prodromal symptoms. She denies loss of consciousness or other injuries. She is an independent ambulator at baseline. The patient was admitted to orthopedic surgery service for further evaluation and management.    PAST MEDICAL HISTORY:  1. Glaucoma. 2. Hypertension.  3. Arthritis. 4. Diverticulitis. 5. Breast cancer. 6. Hysterectomy. 7. History of colostomy which was then reversed.  MEDICATIONS:  1. Enalapril 10 mg daily. 2. Timolol ophthalmic 0.5% solution one drop to each eye at bedtime. 3. Calcium plus Vitamin D 2 tablets once a day.  4. Spectravite multivitamin once a day.  5. Aspirin 325 mg once a day. 6. Tylenol Extra-Strength 2 tabs q.6 hours p.r.n.  7. Peridin-C 1 tablet daily. 8. Letrozole tablet 2.5 mg 1 tablet daily x90 days.   ALLERGIES: Fosamax.   FAMILY HISTORY: Noncontributory.  SOCIAL HISTORY: She lives at home at baseline.   HOSPITAL COURSE: The patient was admitted to the orthopedic surgery service. She was seen by the [facility] hospitalist service and cleared for surgery. She was considered mild to moderate risk for surgery. On hospital day #2 the patient was brought to the operating room where she underwent an uncomplicated open reduction internal fixation of right intertrochanteric hip fracture. Following surgery she returned to the orthopedic floor. On postop day #1 the patient was feeling well. Physical and occupational therapy consults were called for  postop day #1 and they continued to follow her throughout her hospital stay. The [facility] medical service also continued to follow the patient throughout her hospitalization. She continued on antibiotics for 24 hours postop. On postop day #2 her Foley catheter was removed. Daily labs were drawn. Vital signs monitored. The patient did not require any blood transfusions postoperatively. The remainder of the patient's hospital course was uneventful and given her clinical improvement she was prepared for discharge on postop day #5. The patient at that time had had a bowel movement. Her pain was well controlled on oral analgesia and she had no chest pain or shortness of breath. She was progressing with physical therapy.    DISCHARGE INSTRUCTIONS:  1. The patient will be toe-touch weightbearing on the right hip for approximately six weeks.  2. The patient will remain on Lovenox 40 mg daily for DVT prophylaxis.  3. The patient will continue to work with physical therapy for gait training and lower extremity strengthening as well as occupational therapy for activities of daily living.  4. The patient may sit up in a chair.  5. She is to continue wearing TED stockings until her first postop visit in my office.  6. Staples will be removed in the office and her wound checked in approximately 7 to 10 days.   DISCHARGE MEDICATIONS: The patient will restart all of her home medications and will have Norco 5/325 mg tablets 1 to 2 tabs every 4 to 6 hours p.r.n. for pain and Lovenox 40 mg daily x4 weeks and then return to taking her enteric coated aspirin and then remain  on her enteric coated aspirin 325 mg daily thereafter.   DISCHARGE DIAGNOSIS: Status post right hip open reduction and internal fixation for intertrochanteric hip fracture.   ____________________________ Timoteo Gaul, MD klk:drc D: 08/16/2011 08:48:33 ET T: 08/16/2011 09:09:27 ET JOB#: [id number] cc: Timoteo Gaul, MD, <Dictator> Timoteo Gaul MD ELECTRONICALLY SIGNED 08/16/2011 9:30

## 2014-08-04 ENCOUNTER — Ambulatory Visit
Admission: RE | Admit: 2014-08-04 | Discharge: 2014-08-04 | Disposition: A | Discharge status: Home / Self Care | Payer: Medicare Other | Source: Ambulatory Visit | Attending: Internal Medicine | Admitting: Internal Medicine

## 2014-08-04 ENCOUNTER — Ambulatory Visit: Payer: Self-pay

## 2014-08-04 DIAGNOSIS — Z853 Personal history of malignant neoplasm of breast: Secondary | ICD-10-CM

## 2014-08-04 HISTORY — DX: Reserved for concepts with insufficient information to code with codable children: IMO0002

## 2014-08-05 ENCOUNTER — Other Ambulatory Visit: Payer: Self-pay

## 2015-08-25 ENCOUNTER — Other Ambulatory Visit: Payer: Self-pay | Admitting: Internal Medicine

## 2018-05-31 ENCOUNTER — Emergency Department: Payer: Medicare Other

## 2018-05-31 ENCOUNTER — Other Ambulatory Visit: Payer: Self-pay

## 2018-05-31 ENCOUNTER — Encounter: Payer: Self-pay | Admitting: Emergency Medicine

## 2018-05-31 ENCOUNTER — Inpatient Hospital Stay
Admission: EM | Admit: 2018-05-31 | Discharge: 2018-06-04 | DRG: 641 | Disposition: A | Discharge status: Skilled Nursing Facility | Payer: Medicare Other | Attending: Internal Medicine | Admitting: Internal Medicine

## 2018-05-31 DIAGNOSIS — Z87891 Personal history of nicotine dependence: Secondary | ICD-10-CM | POA: Diagnosis not present

## 2018-05-31 DIAGNOSIS — Z888 Allergy status to other drugs, medicaments and biological substances status: Secondary | ICD-10-CM | POA: Diagnosis not present

## 2018-05-31 DIAGNOSIS — Z853 Personal history of malignant neoplasm of breast: Secondary | ICD-10-CM | POA: Diagnosis not present

## 2018-05-31 DIAGNOSIS — Z79899 Other long term (current) drug therapy: Secondary | ICD-10-CM | POA: Diagnosis not present

## 2018-05-31 DIAGNOSIS — E876 Hypokalemia: Principal | ICD-10-CM | POA: Diagnosis present

## 2018-05-31 DIAGNOSIS — M25561 Pain in right knee: Secondary | ICD-10-CM | POA: Diagnosis present

## 2018-05-31 DIAGNOSIS — W19XXXA Unspecified fall, initial encounter: Secondary | ICD-10-CM

## 2018-05-31 DIAGNOSIS — R739 Hyperglycemia, unspecified: Secondary | ICD-10-CM | POA: Diagnosis present

## 2018-05-31 DIAGNOSIS — Z66 Do not resuscitate: Secondary | ICD-10-CM | POA: Diagnosis present

## 2018-05-31 DIAGNOSIS — W010XXA Fall on same level from slipping, tripping and stumbling without subsequent striking against object, initial encounter: Secondary | ICD-10-CM | POA: Diagnosis present

## 2018-05-31 DIAGNOSIS — R531 Weakness: Secondary | ICD-10-CM | POA: Diagnosis present

## 2018-05-31 DIAGNOSIS — H409 Unspecified glaucoma: Secondary | ICD-10-CM | POA: Diagnosis present

## 2018-05-31 DIAGNOSIS — I10 Essential (primary) hypertension: Secondary | ICD-10-CM | POA: Diagnosis present

## 2018-05-31 DIAGNOSIS — Z885 Allergy status to narcotic agent status: Secondary | ICD-10-CM | POA: Diagnosis not present

## 2018-05-31 DIAGNOSIS — Z8249 Family history of ischemic heart disease and other diseases of the circulatory system: Secondary | ICD-10-CM

## 2018-05-31 DIAGNOSIS — Z7982 Long term (current) use of aspirin: Secondary | ICD-10-CM

## 2018-05-31 DIAGNOSIS — Z923 Personal history of irradiation: Secondary | ICD-10-CM

## 2018-05-31 HISTORY — DX: Malignant neoplasm of unspecified site of unspecified female breast: C50.919

## 2018-05-31 HISTORY — DX: Hypoglycemia, unspecified: E16.2

## 2018-05-31 HISTORY — DX: Essential (primary) hypertension: I10

## 2018-05-31 LAB — CBC WITH DIFFERENTIAL/PLATELET
Abs Immature Granulocytes: 0.05 10*3/uL (ref 0.00–0.07)
BASOS ABS: 0 10*3/uL (ref 0.0–0.1)
Basophils Relative: 0 %
EOS ABS: 0 10*3/uL (ref 0.0–0.5)
Eosinophils Relative: 0 %
HEMATOCRIT: 36.2 % (ref 36.0–46.0)
HEMOGLOBIN: 12.6 g/dL (ref 12.0–15.0)
IMMATURE GRANULOCYTES: 1 %
LYMPHS ABS: 1.2 10*3/uL (ref 0.7–4.0)
LYMPHS PCT: 12 %
MCH: 30.4 pg (ref 26.0–34.0)
MCHC: 34.8 g/dL (ref 30.0–36.0)
MCV: 87.4 fL (ref 80.0–100.0)
MONOS PCT: 5 %
Monocytes Absolute: 0.5 10*3/uL (ref 0.1–1.0)
NEUTROS PCT: 82 %
NRBC: 0 % (ref 0.0–0.2)
Neutro Abs: 8.4 10*3/uL — ABNORMAL HIGH (ref 1.7–7.7)
Platelets: 278 10*3/uL (ref 150–400)
RBC: 4.14 MIL/uL (ref 3.87–5.11)
RDW: 12.9 % (ref 11.5–15.5)
WBC: 10.2 10*3/uL (ref 4.0–10.5)

## 2018-05-31 LAB — COMPREHENSIVE METABOLIC PANEL
ALT: 21 U/L (ref 0–44)
AST: 25 U/L (ref 15–41)
Albumin: 3.8 g/dL (ref 3.5–5.0)
Alkaline Phosphatase: 110 U/L (ref 38–126)
Anion gap: 8 (ref 5–15)
BILIRUBIN TOTAL: 0.8 mg/dL (ref 0.3–1.2)
BUN: 20 mg/dL (ref 8–23)
CO2: 29 mmol/L (ref 22–32)
Calcium: 10.2 mg/dL (ref 8.9–10.3)
Chloride: 103 mmol/L (ref 98–111)
Creatinine, Ser: 0.56 mg/dL (ref 0.44–1.00)
GFR calc Af Amer: >60 mL/min (ref >60)
GFR calc non Af Amer: >60 mL/min (ref >60)
Glucose, Bld: 135 mg/dL — ABNORMAL HIGH (ref 70–99)
POTASSIUM: 2.2 mmol/L — AB (ref 3.5–5.1)
Sodium: 140 mmol/L (ref 135–145)
TOTAL PROTEIN: 6.4 g/dL — AB (ref 6.5–8.1)

## 2018-05-31 LAB — GLUCOSE, CAPILLARY: Glucose-Capillary: 118 mg/dL — ABNORMAL HIGH (ref 70–99)

## 2018-05-31 LAB — MAGNESIUM: Magnesium: 2.2 mg/dL (ref 1.7–2.4)

## 2018-05-31 MED ORDER — POTASSIUM CHLORIDE 10 MEQ/100ML IV SOLN
10.0000 meq | Freq: Once | INTRAVENOUS | Status: AC
  Administered 2018-05-31: 23:00:00 10 meq via INTRAVENOUS
  Filled 2018-05-31 (×2): qty 100

## 2018-05-31 MED ORDER — NAPROXEN 375 MG PO TABS: 375.0000 mg | ORAL_TABLET | Freq: Two times a day (BID) | ORAL | Status: DC

## 2018-05-31 MED ORDER — CALCIUM CARBONATE ANTACID 500 MG PO CHEW
500.0000 mg | CHEWABLE_TABLET | Freq: Every day | ORAL | Status: DC
  Administered 2018-06-03 – 2018-06-04 (×2): 500 mg via ORAL
  Filled 2018-05-31 (×3): qty 3

## 2018-05-31 MED ORDER — ACETAMINOPHEN 325 MG PO TABS
650.0000 mg | ORAL_TABLET | Freq: Four times a day (QID) | ORAL | Status: DC | PRN
  Administered 2018-06-01 – 2018-06-04 (×9): 650 mg via ORAL
  Filled 2018-05-31 (×9): qty 2

## 2018-05-31 MED ORDER — POTASSIUM CHLORIDE CRYS ER 20 MEQ PO TBCR
40.0000 meq | EXTENDED_RELEASE_TABLET | ORAL | Status: AC
  Administered 2018-05-31 – 2018-06-01 (×2): 40 meq via ORAL
  Filled 2018-05-31 (×2): qty 2

## 2018-05-31 MED ORDER — ASPIRIN EC 325 MG PO TBEC
325.0000 mg | DELAYED_RELEASE_TABLET | Freq: Every day | ORAL | Status: DC
  Administered 2018-06-01 – 2018-06-04 (×4): 325 mg via ORAL
  Filled 2018-05-31 (×4): qty 1

## 2018-05-31 MED ORDER — VITAMIN D 25 MCG (1000 UNIT) PO TABS
1000.0000 [IU] | ORAL_TABLET | Freq: Every day | ORAL | Status: DC
  Administered 2018-06-03 – 2018-06-04 (×2): 1000 [IU] via ORAL
  Filled 2018-05-31 (×3): qty 1

## 2018-05-31 MED ORDER — ENOXAPARIN SODIUM 40 MG/0.4ML ~~LOC~~ SOLN
40.0000 mg | SUBCUTANEOUS | Status: DC
  Administered 2018-06-01 – 2018-06-02 (×2): 40 mg via SUBCUTANEOUS
  Filled 2018-05-31 (×2): qty 0.4

## 2018-05-31 MED ORDER — ONDANSETRON HCL 4 MG/2ML IJ SOLN: 4.0000 mg | Freq: Four times a day (QID) | INTRAMUSCULAR | Status: DC | PRN

## 2018-05-31 MED ORDER — ENALAPRIL MALEATE 10 MG PO TABS
10.0000 mg | ORAL_TABLET | Freq: Every day | ORAL | Status: DC
  Administered 2018-06-01 – 2018-06-04 (×4): 10 mg via ORAL
  Filled 2018-05-31 (×4): qty 1

## 2018-05-31 MED ORDER — SODIUM CHLORIDE 0.9 % IV BOLUS
1000.0000 mL | Freq: Once | INTRAVENOUS | Status: AC
  Administered 2018-05-31: 1000 mL via INTRAVENOUS

## 2018-05-31 MED ORDER — SODIUM CHLORIDE 0.9 % IV SOLN: 250.0000 mL | INTRAVENOUS | Status: DC | PRN

## 2018-05-31 MED ORDER — ADULT MULTIVITAMIN W/MINERALS CH
1.0000 | ORAL_TABLET | Freq: Every day | ORAL | Status: DC
  Administered 2018-06-03 – 2018-06-04 (×2): 1 via ORAL
  Filled 2018-05-31 (×3): qty 1

## 2018-05-31 MED ORDER — ONDANSETRON HCL 4 MG PO TABS: 4.0000 mg | ORAL_TABLET | Freq: Four times a day (QID) | ORAL | Status: DC | PRN

## 2018-05-31 MED ORDER — POTASSIUM CHLORIDE CRYS ER 20 MEQ PO TBCR
40.0000 meq | EXTENDED_RELEASE_TABLET | Freq: Once | ORAL | Status: AC
  Administered 2018-05-31: 40 meq via ORAL
  Filled 2018-05-31: qty 2

## 2018-05-31 MED ORDER — SODIUM CHLORIDE 0.9% FLUSH: 3.0000 mL | INTRAVENOUS | Status: DC | PRN

## 2018-05-31 MED ORDER — ACETAMINOPHEN 650 MG RE SUPP: 650.0000 mg | Freq: Four times a day (QID) | RECTAL | Status: DC | PRN

## 2018-05-31 MED ORDER — SODIUM CHLORIDE 0.9% FLUSH
3.0000 mL | Freq: Two times a day (BID) | INTRAVENOUS | Status: DC
  Administered 2018-05-31 – 2018-06-03 (×6): 3 mL via INTRAVENOUS

## 2018-05-31 MED ORDER — TIMOLOL MALEATE 0.5 % OP SOLN
1.0000 [drp] | Freq: Every day | OPHTHALMIC | Status: DC
  Administered 2018-06-02 – 2018-06-03 (×3): 1 [drp] via OPHTHALMIC
  Filled 2018-05-31: qty 5

## 2018-05-31 NOTE — ED Notes (Signed)
EDP in with patient 

## 2018-05-31 NOTE — ED Notes (Signed)
ED TO INPATIENT HANDOFF REPORT  ED Nurse Name and Phone #: Stanford Scotland [phone]  S Name/Age/Gender Virginia Clements 85 y.o. female Room/Bed: ED07A/ED07A  Code Status   Code Status: Not on file  Home/SNF/Other Home Patient oriented to: self, place, time and situation Is this baseline? Yes   Triage Complete: Triage complete  Chief Complaint fall ems  Triage Note ARrives via [facility] for evaluation of fall.  Patient states she has been having frequent falls, worsening since December.  Lives home alone and states she has been having more and more difficulties getting up and moving by herself.  Patient is AAOx3.  Skin warm and dry. NAD   Allergies Allergies  Allergen Reactions  . Alendronate Other (See Comments)    Nervousness and shakiness per pt  . Codeine     dizziness    Level of Care/Admitting Diagnosis ED Disposition    ED Disposition Condition [facility] Area: [place] [[id number]]  Level of Care: Med-Surg [16]  Diagnosis: Hypokalemia [172180]  Admitting Physician: Dustin Flock [[id number]]  Attending Physician: Dustin Flock [[id number]]  Estimated length of stay: past midnight tomorrow  Certification:: I certify this patient will need inpatient services for at least 2 midnights  PT Class (Do Not Modify): Inpatient [101]  PT Acc Code (Do Not Modify): Private [1]       B Medical/Surgery History Past Medical History:  Diagnosis Date  . Hypertension   . Hypoglycemia   . Radiation 2009   Past Surgical History:  Procedure Laterality Date  . BREAST BIOPSY Right 2009     A IV Location/Drains/Wounds Patient Lines/Drains/Airways Status   Active Line/Drains/Airways    Name:   Placement date:   Placement time:   Site:   Days:   Peripheral IV 05/31/18 Right Antecubital   05/31/18    2024    Antecubital   less than 1          Intake/Output Last 24 hours No intake or output data in the 24 hours ending 05/31/18  2206  Labs/Imaging Results for orders placed or performed during the hospital encounter of 05/31/18 (from the past 48 hour(s))  CBC with Differential     Status: Abnormal   Collection Time: 05/31/18  2:58 PM  Result Value Ref Range   WBC 10.2 4.0 - 10.5 K/uL   RBC 4.14 3.87 - 5.11 MIL/uL   Hemoglobin 12.6 12.0 - 15.0 g/dL   HCT 36.2 36.0 - 46.0 %   MCV 87.4 80.0 - 100.0 fL   MCH 30.4 26.0 - 34.0 pg   MCHC 34.8 30.0 - 36.0 g/dL   RDW 12.9 11.5 - 15.5 %   Platelets 278 150 - 400 K/uL   nRBC 0.0 0.0 - 0.2 %   Neutrophils Relative % 82 %   Neutro Abs 8.4 (H) 1.7 - 7.7 K/uL   Lymphocytes Relative 12 %   Lymphs Abs 1.2 0.7 - 4.0 K/uL   Monocytes Relative 5 %   Monocytes Absolute 0.5 0.1 - 1.0 K/uL   Eosinophils Relative 0 %   Eosinophils Absolute 0.0 0.0 - 0.5 K/uL   Basophils Relative 0 %   Basophils Absolute 0.0 0.0 - 0.1 K/uL   Immature Granulocytes 1 %   Abs Immature Granulocytes 0.05 0.00 - 0.07 K/uL    Comment: Performed at [facility], [address] [place], [place] [address]  Comprehensive metabolic panel     Status: Abnormal   Collection Time:  05/31/18  2:58 PM  Result Value Ref Range   Sodium 140 135 - 145 mmol/L   Potassium 2.2 (LL) 3.5 - 5.1 mmol/L    Comment: CRITICAL RESULT CALLED TO, READ BACK BY AND VERIFIED WITH JANE RYAN 05/31/18 @ 1525  MLK    Chloride 103 98 - 111 mmol/L   CO2 29 22 - 32 mmol/L   Glucose, Bld 135 (H) 70 - 99 mg/dL   BUN 20 8 - 23 mg/dL   Creatinine, Ser 0.56 0.44 - 1.00 mg/dL   Calcium 10.2 8.9 - 10.3 mg/dL   Total Protein 6.4 (L) 6.5 - 8.1 g/dL   Albumin 3.8 3.5 - 5.0 g/dL   AST 25 15 - 41 U/L   ALT 21 0 - 44 U/L   Alkaline Phosphatase 110 38 - 126 U/L   Total Bilirubin 0.8 0.3 - 1.2 mg/dL   GFR calc non Af Amer >60 >60 mL/min   GFR calc Af Amer >60 >60 mL/min   Anion gap 8 5 - 15    Comment: Performed at [facility], [address] [place], [place] [address]  Glucose, capillary     Status: Abnormal    Collection Time: 05/31/18  2:58 PM  Result Value Ref Range   Glucose-Capillary 118 (H) 70 - 99 mg/dL  Magnesium     Status: None   Collection Time: 05/31/18  2:58 PM  Result Value Ref Range   Magnesium 2.2 1.7 - 2.4 mg/dL    Comment: Performed at [facility], [address], [place], [place] [address]   Dg Ankle Complete Right  Result Date: 05/31/2018 CLINICAL DATA:  Right knee and right ankle pain.  Fall. EXAM: RIGHT ANKLE - COMPLETE 3+ VIEW COMPARISON:  None. FINDINGS: No acute bony abnormality. Specifically, no fracture, subluxation, or dislocation. Soft tissues intact. IMPRESSION: No acute bony abnormality. Electronically Signed   By: Rolm Baptise M.D.   On: 05/31/2018 20:01   Ct Head Wo Contrast  Result Date: 05/31/2018 CLINICAL DATA:  Head trauma.  Status post fall EXAM: CT HEAD WITHOUT CONTRAST TECHNIQUE: Contiguous axial images were obtained from the base of the skull through the vertex without intravenous contrast. COMPARISON:  None FINDINGS: Brain: No evidence of acute infarction, hemorrhage, hydrocephalus, extra-axial collection or mass lesion/mass effect. There is mild diffuse low-attenuation within the subcortical and periventricular white matter compatible with chronic microvascular disease. There is marked prominence of the sulci and ventricles compatible with brain atrophy. Vascular: No hyperdense vessel or unexpected calcification. Skull: Normal. Negative for fracture or focal lesion. Sinuses/Orbits: No acute finding. Other: None IMPRESSION: 1. No acute intracranial abnormalities. 2. Chronic small vessel ischemic change and brain atrophy. Electronically Signed   By: Kerby Moors M.D.   On: 05/31/2018 19:37   Dg Knee Complete 4 Views Right  Result Date: 05/31/2018 CLINICAL DATA:  Fall, right knee and ankle pain EXAM: RIGHT KNEE - COMPLETE 4+ VIEW COMPARISON:  None. FINDINGS: Advanced tricompartment degenerative changes with joint space narrowing and spurring, most  pronounced in the medial and lateral compartments. Chondrocalcinosis. No acute bony abnormality. Specifically, no fracture, subluxation, or dislocation. No joint effusion. IMPRESSION: Advanced tricompartment degenerative changes with chondrocalcinosis. No acute bony abnormality. Electronically Signed   By: Rolm Baptise M.D.   On: 05/31/2018 20:01    Pending Labs Unresulted Labs (From admission, onward)    Start     Ordered   05/31/18 1452  Urinalysis, Complete w Microscopic  Once,   STAT  05/31/18 1451   Signed and Held  CBC  (enoxaparin (LOVENOX)    CrCl >/= 30 ml/min)  Once,   R    Comments:  Baseline for enoxaparin therapy IF NOT ALREADY DRAWN.  Notify MD if PLT < 100 K.    Signed and Held   Signed and Held  Creatinine, serum  (enoxaparin (LOVENOX)    CrCl >/= 30 ml/min)  Once,   R    Comments:  Baseline for enoxaparin therapy IF NOT ALREADY DRAWN.    Signed and Held   Signed and Held  Creatinine, serum  (enoxaparin (LOVENOX)    CrCl >/= 30 ml/min)  Weekly,   R    Comments:  while on enoxaparin therapy    Signed and Held   Signed and Held  TSH  Once,   R     Signed and Held   Signed and Held  Basic metabolic panel  Tomorrow morning,   R     Signed and Held   Signed and Held  CBC  Tomorrow morning,   R     Signed and Held          Vitals/Pain Today's Vitals   05/31/18 1452 05/31/18 1830 05/31/18 1900 05/31/18 2144  BP: (!) 181/86 (!) 174/92 (!) 180/88 (!) 170/74  Pulse: 81 72 69 70  Resp: 16 20 13 16   Temp: 98.5 F (36.9 C)     TempSrc: Oral     SpO2: 96% 97% 96% 98%  Weight:      Height:      PainSc:        Isolation Precautions No active isolations  Medications Medications  potassium chloride 10 mEq in 100 mL IVPB (has no administration in time range)  potassium chloride SA (K-DUR,KLOR-CON) CR tablet 40 mEq (has no administration in time range)  sodium chloride 0.9 % bolus 1,000 mL (0 mLs Intravenous Stopped 05/31/18 2203)  potassium chloride SA  (K-DUR,KLOR-CON) CR tablet 40 mEq (40 mEq Oral Given 05/31/18 2025)    Mobility walks with device (P) High fall risk   Focused Assessments Fall   R Recommendations: See Admitting Provider Note  Report given to:   Additional Notes:

## 2018-05-31 NOTE — ED Provider Notes (Signed)
[facility] Emergency Department Provider Note  Time seen: 8:20 PM  I have reviewed the triage vital signs and the nursing notes.   HISTORY  Chief Complaint Fall    HPI Virginia Clements is a 85 y.o. female with a past medical history of hypertension, presents to the emergency department for a fall, knee pain and weakness.  According to the patient today she had a fall, states she believes she tripped although she does feel somewhat weak today as well.  Patient's main complaint is of right knee pain.  Did hit her head but denies LOC.  Mild headache.  Largely negative review of systems otherwise.   Past Medical History:  Diagnosis Date  . Hypertension   . Hypoglycemia   . Radiation 2009    There are no active problems to display for this patient.   Past Surgical History:  Procedure Laterality Date  . BREAST BIOPSY Right 2009    Prior to Admission medications   Medication Sig Start Date End Date Taking? Authorizing Provider  aspirin EC 325 MG tablet Take 325 mg by mouth daily.   Yes [provider]  Calcium Carbonate (CALCIUM 600 PO) Take 600 mg by mouth daily.   Yes [provider]  Cholecalciferol (VITAMIN D3) 25 MCG (1000 UT) CAPS Take 1,000 Units by mouth daily.   Yes [provider]  enalapril (VASOTEC) 10 MG tablet Take 10 mg by mouth daily.   Yes [provider]  Multiple Vitamin (MULTIVITAMIN) tablet Take 1 tablet by mouth daily.   Yes [provider]  naproxen (NAPROSYN) 375 MG tablet Take 375 mg by mouth 2 (two) times daily with a meal.   Yes [provider]  timolol (TIMOPTIC-XR) 0.5 % ophthalmic gel-forming Place 1 drop into both eyes at bedtime. 07/06/14  Yes [provider]    Allergies  Allergen Reactions  . Alendronate Other (See Comments)    Nervousness and shakiness per pt  . Codeine     dizziness    No family history on file.  Social History Social History    Tobacco Use  . Smoking status: Former Research scientist (life sciences)  . Smokeless tobacco: Never Used  Substance Use Topics  . Alcohol use: Not Currently  . Drug use: Never    Review of Systems Constitutional: Negative for fever.  Some weakness today. Cardiovascular: Negative for chest pain. Respiratory: Negative for shortness of breath. Gastrointestinal: Negative for abdominal pain, vomiting and diarrhea. Genitourinary: Negative for urinary compaints Musculoskeletal: Right knee and ankle pain Skin: Negative for skin complaints  Neurological: Mild headache All other ROS negative  ____________________________________________   PHYSICAL EXAM:  VITAL SIGNS: ED Triage Vitals  Enc Vitals Group     BP 05/31/18 1452 (!) 181/86     Pulse Rate 05/31/18 1452 81     Resp 05/31/18 1452 16     Temp 05/31/18 1452 98.5 F (36.9 C)     Temp Source 05/31/18 1452 Oral     SpO2 05/31/18 1452 96 %     Weight 05/31/18 1449 198 lb (89.8 kg)     Height 05/31/18 1449 5\' 2"  (1.575 m)     Head Circumference --      Peak Flow --      Pain Score 05/31/18 1448 6     Pain Loc --      Pain Edu? --      Excl. in [place]? --    Constitutional: Alert and oriented. Well appearing and  in no distress. Eyes: Normal exam ENT   Head: Small occipital scalp abrasion.   Mouth/Throat: Mucous membranes are moist. Cardiovascular: Normal rate, regular rhythm.  Respiratory: Normal respiratory effort without tachypnea nor retractions. Breath sounds are clear  Gastrointestinal: Soft and nontender. No distention.   Musculoskeletal: Moderate right knee tenderness to palpation with moderate pain with any attempted range of motion.  Mild right ankle tenderness to palpation.  No C, T, or L-spine tenderness. Neurologic:  Normal speech and language. No gross focal neurologic deficits Skin:  Skin is warm, dry and intact.  Psychiatric: Mood and affect are normal.   ____________________________________________    EKG  EKG viewed and  interpreted by myself shows a normal sinus rhythm at 82 bpm with a narrow QRS, left axis deviation, largely normal intervals with no concerning ST changes.  ____________________________________________    RADIOLOGY  CT scan of the head is negative. Knee x-ray shows advanced degenerative changes without acute abnormality. Ankle x-rays negative   ____________________________________________   INITIAL IMPRESSION / ASSESSMENT AND PLAN / ED COURSE  Pertinent labs & imaging results that were available during my care of the patient were reviewed by me and considered in my medical decision making (see chart for details).  Patient presents to the emergency department after a fall complaining of right knee and ankle pain some weakness today as well.  Patient states she has had increased falls over the past several months.  We will check labs including urinalysis.  We will obtain x-rays of the right knee and right ankle as well as a CT scan of the head as a precaution.  Patient agreeable to plan of care  Labs show low potassium 2.2.  We will begin oral and IV repletion.  Urinalysis pending.  Patient unable to ambulate at this time due to right knee pain, x-ray negative for acute fracture but shows severe degenerative changes, suspect exacerbation of arthritis.  ____________________________________________   FINAL CLINICAL IMPRESSION(S) / ED DIAGNOSES  Fall Hypokalemia Right knee pain   Harvest Dark, MD 05/31/18 2024

## 2018-05-31 NOTE — Progress Notes (Signed)
Advanced care plan.  Purpose of the Encounter: CODE STATUS  Parties in Attendance: Patient herself Patient's Decision Capacity: Intact  Subjective/Patient's story:  Patient is 85 year old with history of hypertension status post fall now noted to have severe hypokalemia  Objective/Medical story I discussed with the patient regarding her desires for cardiac and pulmonary resuscitation   Goals of care determination:  Patient states that she would like a natural death and wants to be a DNR  CODE STATUS:  DNR  Time spent discussing advanced care planning: 16 minutes

## 2018-05-31 NOTE — ED Triage Notes (Signed)
ARrives via [facility] for evaluation of fall.  Patient states she has been having frequent falls, worsening since December.  Lives home alone and states she has been having more and more difficulties getting up and moving by herself.  Patient is AAOx3.  Skin warm and dry. NAD

## 2018-05-31 NOTE — ED Notes (Signed)
Patient refused to be changed.

## 2018-05-31 NOTE — H&P (Signed)
[facility] [place] NAME: Virginia Clements    MR#:  [id number]  DATE OF BIRTH:  09-Jun-1929  DATE OF ADMISSION:  05/31/2018  PRIMARY CARE PHYSICIAN: Baxter Hire, MD   REQUESTING/REFERRING PHYSICIAN: Harvest Dark, MD  CHIEF COMPLAINT:   Chief Complaint  Patient presents with  . Fall    HISTORY OF PRESENT ILLNESS: Virginia Clements  is a 85 y.o. female with a known history of hypertension, history of breast cancer presenting after a fall.  Patient states that she lives by herself and uses a walker.  And she fell.  She was having pain in her knee after the fall however when evaluated in the ER she was noted to have severe hypokalemia.  Patient not taking any diuretics.  She denies any diarrhea or emesis.  She complains of generalized weakness and fatigue ongoing for weeks.  Patient's potassium previously was normal.  PAST MEDICAL HISTORY:   Past Medical History:  Diagnosis Date  . Breast cancer ([place])   . Hypertension   . Hypoglycemia   . Radiation 2009    PAST SURGICAL HISTORY:  Past Surgical History:  Procedure Laterality Date  . BREAST BIOPSY Right 2009    SOCIAL HISTORY:  Social History   Tobacco Use  . Smoking status: Former Research scientist (life sciences)  . Smokeless tobacco: Never Used  Substance Use Topics  . Alcohol use: Not Currently    FAMILY HISTORY:  Family History  Problem Relation Age of Onset  . Hypertension Mother     DRUG ALLERGIES:  Allergies  Allergen Reactions  . Alendronate Other (See Comments)    Nervousness and shakiness per pt  . Codeine     dizziness    REVIEW OF SYSTEMS:   CONSTITUTIONAL: No fever, positive fatigue or positive weakness.  EYES: No blurred or double vision.  EARS, NOSE, AND THROAT: No tinnitus or ear pain.  RESPIRATORY: No cough, shortness of breath, wheezing or hemoptysis.  CARDIOVASCULAR: No chest pain, orthopnea, edema.  GASTROINTESTINAL: No nausea, vomiting, diarrhea or  abdominal pain.  GENITOURINARY: No dysuria, hematuria.  ENDOCRINE: No polyuria, nocturia,  HEMATOLOGY: No anemia, easy bruising or bleeding SKIN: No rash or lesion. MUSCULOSKELETAL: No joint pain or arthritis.   NEUROLOGIC: No tingling, numbness, weakness.  PSYCHIATRY: No anxiety or depression.   MEDICATIONS AT HOME:  Prior to Admission medications   Medication Sig Start Date End Date Taking? Authorizing Provider  aspirin EC 325 MG tablet Take 325 mg by mouth daily.   Yes [provider]  Calcium Carbonate (CALCIUM 600 PO) Take 600 mg by mouth daily.   Yes [provider]  Cholecalciferol (VITAMIN D3) 25 MCG (1000 UT) CAPS Take 1,000 Units by mouth daily.   Yes [provider]  enalapril (VASOTEC) 10 MG tablet Take 10 mg by mouth daily.   Yes [provider]  Multiple Vitamin (MULTIVITAMIN) tablet Take 1 tablet by mouth daily.   Yes [provider]  naproxen (NAPROSYN) 375 MG tablet Take 375 mg by mouth 2 (two) times daily with a meal.   Yes [provider]  timolol (TIMOPTIC-XR) 0.5 % ophthalmic gel-forming Place 1 drop into both eyes at bedtime. 07/06/14  Yes [provider]      PHYSICAL EXAMINATION:   VITAL SIGNS: Blood pressure (!) 170/74, pulse 70, temperature 98.5 F (36.9 C), temperature source Oral, resp. rate 16, height 5\' 2"  (1.575 m), weight 89.8 kg, SpO2 98 %.  GENERAL:  85 y.o.-year-old patient  lying in the bed with no acute distress.  EYES: Pupils equal, round, reactive to light and accommodation. No scleral icterus. Extraocular muscles intact.  HEENT: Head atraumatic, normocephalic. Oropharynx and nasopharynx clear.  NECK:  Supple, no jugular venous distention. No thyroid enlargement, no tenderness.  LUNGS: Normal breath sounds bilaterally, no wheezing, rales,rhonchi or crepitation. No use of accessory muscles of respiration.  CARDIOVASCULAR: S1, S2 normal. No murmurs, rubs, or gallops.  ABDOMEN: Soft,  nontender, nondistended. Bowel sounds present. No organomegaly or mass.  EXTREMITIES: No pedal edema, cyanosis, or clubbing.  NEUROLOGIC: Cranial nerves II through XII are intact. Muscle strength 5/5 in all extremities. Sensation intact. Gait not checked.  PSYCHIATRIC: The patient is alert and oriented x 3.  SKIN: No obvious rash, lesion, or ulcer.   LABORATORY PANEL:   CBC Recent Labs  Lab 05/31/18 1458  WBC 10.2  HGB 12.6  HCT 36.2  PLT 278  MCV 87.4  MCH 30.4  MCHC 34.8  RDW 12.9  LYMPHSABS 1.2  MONOABS 0.5  EOSABS 0.0  BASOSABS 0.0   ------------------------------------------------------------------------------------------------------------------  Chemistries  Recent Labs  Lab 05/31/18 1458  NA 140  K 2.2*  CL 103  CO2 29  GLUCOSE 135*  BUN 20  CREATININE 0.56  CALCIUM 10.2  MG 2.2  AST 25  ALT 21  ALKPHOS 110  BILITOT 0.8   ------------------------------------------------------------------------------------------------------------------ estimated creatinine clearance is 52.6 mL/min (by C-G formula based on SCr of 0.56 mg/dL). ------------------------------------------------------------------------------------------------------------------ No results for input(s): TSH, T4TOTAL, T3FREE, THYROIDAB in the last 72 hours.  Invalid input(s): FREET3   Coagulation profile No results for input(s): INR, PROTIME in the last 168 hours. ------------------------------------------------------------------------------------------------------------------- No results for input(s): DDIMER in the last 72 hours. -------------------------------------------------------------------------------------------------------------------  Cardiac Enzymes No results for input(s): CKMB, TROPONINI, MYOGLOBIN in the last 168 hours.  Invalid input(s): CK ------------------------------------------------------------------------------------------------------------------ Invalid input(s):  POCBNP  ---------------------------------------------------------------------------------------------------------------  Urinalysis    Component Value Date/Time   COLORURINE Yellow 08/10/2011 0226   APPEARANCEUR Clear 08/10/2011 0226   LABSPEC 1.025 08/10/2011 0226   PHURINE 5.0 08/10/2011 0226   GLUCOSEU Negative 08/10/2011 0226   HGBUR Negative 08/10/2011 0226   BILIRUBINUR Negative 08/10/2011 0226   KETONESUR Negative 08/10/2011 0226   PROTEINUR 30 mg/dL 08/10/2011 0226   NITRITE Negative 08/10/2011 0226   LEUKOCYTESUR Negative 08/10/2011 0226     RADIOLOGY: Dg Ankle Complete Right  Result Date: 05/31/2018 CLINICAL DATA:  Right knee and right ankle pain.  Fall. EXAM: RIGHT ANKLE - COMPLETE 3+ VIEW COMPARISON:  None. FINDINGS: No acute bony abnormality. Specifically, no fracture, subluxation, or dislocation. Soft tissues intact. IMPRESSION: No acute bony abnormality. Electronically Signed   By: Rolm Baptise M.D.   On: 05/31/2018 20:01   Ct Head Wo Contrast  Result Date: 05/31/2018 CLINICAL DATA:  Head trauma.  Status post fall EXAM: CT HEAD WITHOUT CONTRAST TECHNIQUE: Contiguous axial images were obtained from the base of the skull through the vertex without intravenous contrast. COMPARISON:  None FINDINGS: Brain: No evidence of acute infarction, hemorrhage, hydrocephalus, extra-axial collection or mass lesion/mass effect. There is mild diffuse low-attenuation within the subcortical and periventricular white matter compatible with chronic microvascular disease. There is marked prominence of the sulci and ventricles compatible with brain atrophy. Vascular: No hyperdense vessel or unexpected calcification. Skull: Normal. Negative for fracture or focal lesion. Sinuses/Orbits: No acute finding. Other: None IMPRESSION: 1. No acute intracranial abnormalities. 2. Chronic small vessel ischemic change and brain atrophy. Electronically Signed   By: Queen Slough.D.  On: 05/31/2018 19:37    Dg Knee Complete 4 Views Right  Result Date: 05/31/2018 CLINICAL DATA:  Fall, right knee and ankle pain EXAM: RIGHT KNEE - COMPLETE 4+ VIEW COMPARISON:  None. FINDINGS: Advanced tricompartment degenerative changes with joint space narrowing and spurring, most pronounced in the medial and lateral compartments. Chondrocalcinosis. No acute bony abnormality. Specifically, no fracture, subluxation, or dislocation. No joint effusion. IMPRESSION: Advanced tricompartment degenerative changes with chondrocalcinosis. No acute bony abnormality. Electronically Signed   By: Rolm Baptise M.D.   On: 05/31/2018 20:01    EKG: Orders placed or performed during the hospital encounter of 05/31/18  . EKG 12-Lead  . EKG 12-Lead    IMPRESSION AND PLAN: Patient is 85 year old presenting with a fall noted to have severe hypokalemia  1.  Severe hypokalemia etiology unclear we will replace her potassium monitor potassium if does not improve nephrology consult Will also check magnesium  2.  Hypertension continue Vasotec  3.  Glaucoma continue eyedrops  4.  Elevated blood sugar we will check a hemoglobin A1c  All the records are reviewed and case discussed with ED provider. Management plans discussed with the patient, family and they are in agreement.  CODE STATUS: DNR   TOTAL TIME TAKING CARE OF THIS PATIENT: 6minutes.    Dustin Flock M.D on 05/31/2018 at 10:35 PM  Between 7am to 6pm - Pager - [phone]  After 6pm go to [email or web address] - password [email or web address] [facility]  [phone]  CC: Primary care physician; Baxter Hire, MD

## 2018-05-31 NOTE — ED Notes (Signed)
Patient fell this AM. Patient has skin tear on left elbow and having right ankle pain 8/10. Patient states hit head but did not have LOC. Patient has been having lots of pain since fall she had 3weeks ago.

## 2018-06-01 LAB — URINALYSIS, COMPLETE (UACMP) WITH MICROSCOPIC
BILIRUBIN URINE: NEGATIVE
Glucose, UA: NEGATIVE mg/dL
Hgb urine dipstick: NEGATIVE
KETONES UR: NEGATIVE mg/dL
Leukocytes,Ua: NEGATIVE
Nitrite: NEGATIVE
PROTEIN: NEGATIVE mg/dL
Specific Gravity, Urine: 1.009 (ref 1.005–1.030)
pH: 8 (ref 5.0–8.0)

## 2018-06-01 LAB — BASIC METABOLIC PANEL
Anion gap: 6 (ref 5–15)
BUN: 16 mg/dL (ref 8–23)
CO2: 27 mmol/L (ref 22–32)
Calcium: 9.1 mg/dL (ref 8.9–10.3)
Chloride: 110 mmol/L (ref 98–111)
Creatinine, Ser: 0.67 mg/dL (ref 0.44–1.00)
GFR calc Af Amer: >60 mL/min (ref >60)
GFR calc non Af Amer: >60 mL/min (ref >60)
Glucose, Bld: 95 mg/dL (ref 70–99)
Potassium: 2.7 mmol/L — CL (ref 3.5–5.1)
Sodium: 143 mmol/L (ref 135–145)

## 2018-06-01 LAB — CBC
HCT: 30.9 % — ABNORMAL LOW (ref 36.0–46.0)
Hemoglobin: 10.7 g/dL — ABNORMAL LOW (ref 12.0–15.0)
MCH: 31.3 pg (ref 26.0–34.0)
MCHC: 34.6 g/dL (ref 30.0–36.0)
MCV: 90.4 fL (ref 80.0–100.0)
PLATELETS: 220 10*3/uL (ref 150–400)
RBC: 3.42 MIL/uL — ABNORMAL LOW (ref 3.87–5.11)
RDW: 13.1 % (ref 11.5–15.5)
WBC: 6.7 10*3/uL (ref 4.0–10.5)
nRBC: 0 % (ref 0.0–0.2)

## 2018-06-01 LAB — PHOSPHORUS: Phosphorus: 2.2 mg/dL — ABNORMAL LOW (ref 2.5–4.6)

## 2018-06-01 LAB — TSH: TSH: 0.461 u[IU]/mL (ref 0.350–4.500)

## 2018-06-01 LAB — MAGNESIUM: Magnesium: 1.9 mg/dL (ref 1.7–2.4)

## 2018-06-01 LAB — HEMOGLOBIN A1C
Hgb A1c MFr Bld: 5 % (ref 4.8–5.6)
Mean Plasma Glucose: 96.8 mg/dL

## 2018-06-01 MED ORDER — POTASSIUM CHLORIDE CRYS ER 20 MEQ PO TBCR
40.0000 meq | EXTENDED_RELEASE_TABLET | Freq: Once | ORAL | Status: AC
  Administered 2018-06-01: 07:00:00 40 meq via ORAL
  Filled 2018-06-01: qty 2

## 2018-06-01 MED ORDER — POTASSIUM CHLORIDE CRYS ER 20 MEQ PO TBCR
20.0000 meq | EXTENDED_RELEASE_TABLET | Freq: Two times a day (BID) | ORAL | Status: AC
  Administered 2018-06-01 – 2018-06-02 (×3): 20 meq via ORAL
  Filled 2018-06-01 (×3): qty 1

## 2018-06-01 NOTE — Evaluation (Signed)
Physical Therapy Evaluation Patient Details Name: Virginia Clements MRN: [id number] DOB: Dec 16, 1929 Today's Date: 06/01/2018   History of Present Illness  85 yo female with onset of idopathic hypokalemia and head trauma from a fall was referred to PT for mobility ck.  Pt is home alone per her report, had been falling and now R knee painful.  Noted no fractures, chondrocalcinosis on R knee per imaging.  PMHx:  HTN, breast CA, glaucoma,   Clinical Impression  Pt is up to stand with assistance only briefly, noted her weakness and fatigue with all movement due to low K+ which has not been repleted yet.  Pt is anticipating her transition to SNF but is interested in ALF permanently.  Talked with her about SW assisting from SNF to see about this, and asked if family could help.  Pt does not immediately think her family will have time to assist this so will anticipate staff of SNF will need to contribute to the effort.    Follow Up Recommendations SNF    Equipment Recommendations  None recommended by PT    Recommendations for Other Services       Precautions / Restrictions Precautions Precautions: Fall Restrictions Weight Bearing Restrictions: No      Mobility  Bed Mobility Overal bed mobility: Needs Assistance Bed Mobility: Supine to Sit;Sit to Supine     Supine to sit: Mod assist Sit to supine: Max assist   General bed mobility comments: mod to lift trunk and finish scooting to EOB, max to lift legs and assist trunk back to bed  Transfers Overall transfer level: Needs assistance Equipment used: 1 person hand held assist Transfers: Sit to/from Stand Sit to Stand: Mod assist         General transfer comment: mod to stand then sits immediately, feeling weak  Ambulation/Gait             General Gait Details: unable  Stairs            Wheelchair Mobility    Modified Rankin (Stroke Patients Only)       Balance Overall balance assessment: History of  Falls;Needs assistance Sitting-balance support: Feet supported;Bilateral upper extremity supported Sitting balance-Leahy Scale: Fair     Standing balance support: Bilateral upper extremity supported;During functional activity Standing balance-Leahy Scale: Poor                               Pertinent Vitals/Pain Pain Assessment: Faces Faces Pain Scale: Hurts little more Pain Location: R knee with mobility on bed Pain Descriptors / Indicators: Aching Pain Intervention(s): Monitored during session;Limited activity within patient's tolerance;Repositioned    Home Living Family/patient expects to be discharged to:: Private residence Living Arrangements: Alone Available Help at Discharge: Family;Friend(s);Available PRN/intermittently Type of Home: House Home Access: Stairs to enter     Home Layout: One level Home Equipment: Environmental consultant - 2 wheels Additional Comments: pt reports she has not needed more equipment than this    Prior Function Level of Independence: Independent with assistive device(s)         Comments: pt was out in the community shopping and able to care for herself and her home     Hand Dominance   Dominant Hand: Right    Extremity/Trunk Assessment   Upper Extremity Assessment Upper Extremity Assessment: Generalized weakness    Lower Extremity Assessment Lower Extremity Assessment: Generalized weakness    Cervical / Trunk Assessment Cervical / Trunk  Assessment: Kyphotic  Communication   Communication: No difficulties  Cognition Arousal/Alertness: Awake/alert Behavior During Therapy: Flat affect Overall Cognitive Status: Within Functional Limits for tasks assessed                                 General Comments: pt is weak but following instructions      General Comments General comments (skin integrity, edema, etc.): pt was assisted to side of bed with weakness driving her ability to move, nursing aware and had agreed to  bedside visit only due to low K+    Exercises     Assessment/Plan    PT Assessment Patient needs continued PT services  PT Problem List Decreased strength;Decreased range of motion;Decreased activity tolerance;Decreased balance;Decreased mobility;Decreased coordination;Decreased knowledge of use of DME;Decreased safety awareness;Cardiopulmonary status limiting activity;Obesity;Pain       PT Treatment Interventions DME instruction;Gait training;Stair training;Functional mobility training;Therapeutic activities;Therapeutic exercise;Balance training;Neuromuscular re-education;Patient/family education    PT Goals (Current goals can be found in the Care Plan section)  Acute Rehab PT Goals Patient Stated Goal: to get to a more controlled environment permanently PT Goal Formulation: With patient Time For Goal Achievement: 06/15/18 Potential to Achieve Goals: Good    Frequency Min 2X/week   Barriers to discharge Inaccessible home environment;Decreased caregiver support home with no assistance from family and worried about her ability to keep up    Co-evaluation               AM-PAC PT "6 Clicks" Mobility  Outcome Measure Help needed turning from your back to your side while in a flat bed without using bedrails?: A Little Help needed moving from lying on your back to sitting on the side of a flat bed without using bedrails?: A Little Help needed moving to and from a bed to a chair (including a wheelchair)?: A Lot Help needed standing up from a chair using your arms (e.g., wheelchair or bedside chair)?: A Lot Help needed to walk in hospital room?: Total Help needed climbing 3-5 steps with a railing? : Total 6 Click Score: 12    End of Session Equipment Utilized During Treatment: Gait belt Activity Tolerance: Patient tolerated treatment well;Patient limited by fatigue Patient left: in bed;with call bell/phone within reach;with bed alarm set Nurse Communication: Mobility  status PT Visit Diagnosis: Unsteadiness on feet (R26.81);Muscle weakness (generalized) (M62.81);History of falling (Z91.81)    Time: 8099-8338 PT Time Calculation (min) (ACUTE ONLY): 14 min   Charges:   PT Evaluation $PT Eval Moderate Complexity: 1 Mod         Ramond Dial 06/01/2018, 12:19 PM  Mee Hives, PT MS Acute Rehab Dept. Number: [place] [facility] [place]

## 2018-06-02 LAB — BASIC METABOLIC PANEL
Anion gap: 7 (ref 5–15)
BUN: 16 mg/dL (ref 8–23)
CO2: 25 mmol/L (ref 22–32)
Calcium: 9.5 mg/dL (ref 8.9–10.3)
Chloride: 106 mmol/L (ref 98–111)
Creatinine, Ser: 0.6 mg/dL (ref 0.44–1.00)
GFR calc Af Amer: >60 mL/min (ref >60)
GFR calc non Af Amer: >60 mL/min (ref >60)
Glucose, Bld: 101 mg/dL — ABNORMAL HIGH (ref 70–99)
Potassium: 3.7 mmol/L (ref 3.5–5.1)
Sodium: 138 mmol/L (ref 135–145)

## 2018-06-02 LAB — MAGNESIUM: Magnesium: 2.1 mg/dL (ref 1.7–2.4)

## 2018-06-02 MED ORDER — ENOXAPARIN SODIUM 40 MG/0.4ML ~~LOC~~ SOLN
40.0000 mg | SUBCUTANEOUS | Status: DC
  Administered 2018-06-02 – 2018-06-03 (×2): 40 mg via SUBCUTANEOUS
  Filled 2018-06-02 (×2): qty 0.4

## 2018-06-02 MED ORDER — AMLODIPINE BESYLATE 10 MG PO TABS
10.0000 mg | ORAL_TABLET | Freq: Every day | ORAL | Status: DC
  Administered 2018-06-02 – 2018-06-04 (×3): 10 mg via ORAL
  Filled 2018-06-02 (×3): qty 1

## 2018-06-02 NOTE — Progress Notes (Signed)
[facility] [place] NAME: Virginia Clements    MR#:  [id number]  DATE OF BIRTH:  [birth year]  SUBJECTIVE:  CHIEF COMPLAINT:   Chief Complaint  Patient presents with  . Fall   Came after recurrent fall and had generalized weakness noted to have hypokalemia.  Slightly better today. REVIEW OF SYSTEMS:  CONSTITUTIONAL: No fever, have fatigue or weakness.  EYES: No blurred or double vision.  EARS, NOSE, AND THROAT: No tinnitus or ear pain.  RESPIRATORY: No cough, shortness of breath, wheezing or hemoptysis.  CARDIOVASCULAR: No chest pain, orthopnea, edema.  GASTROINTESTINAL: No nausea, vomiting, diarrhea or abdominal pain.  GENITOURINARY: No dysuria, hematuria.  ENDOCRINE: No polyuria, nocturia,  HEMATOLOGY: No anemia, easy bruising or bleeding SKIN: No rash or lesion. MUSCULOSKELETAL: No joint pain or arthritis.   NEUROLOGIC: No tingling, numbness, have generalized weakness.  PSYCHIATRY: No anxiety or depression.   ROS  DRUG ALLERGIES:   Allergies  Allergen Reactions  . Alendronate Other (See Comments)    Nervousness and shakiness per pt  . Codeine     dizziness    VITALS:  Blood pressure (!) 170/90, pulse 66, temperature 98.6 F (37 C), resp. rate (!) 22, height 5\' 2"  (1.575 m), weight 68.5 kg, SpO2 93 %.  PHYSICAL EXAMINATION:  GENERAL:  85 y.o.-year-old patient lying in the bed with no acute distress.  EYES: Pupils equal, round, reactive to light and accommodation. No scleral icterus. Extraocular muscles intact.  HEENT: Head atraumatic, normocephalic. Oropharynx and nasopharynx clear.  NECK:  Supple, no jugular venous distention. No thyroid enlargement, no tenderness.  LUNGS: Normal breath sounds bilaterally, no wheezing, rales,rhonchi or crepitation. No use of accessory muscles of respiration.  CARDIOVASCULAR: S1, S2 normal. No murmurs, rubs, or gallops.  ABDOMEN: Soft, nontender, nondistended. Bowel sounds present. No  organomegaly or mass.  EXTREMITIES: No pedal edema, cyanosis, or clubbing.  NEUROLOGIC: Cranial nerves II through XII are intact. Muscle strength 4/5 in all extremities. Sensation intact. Gait not checked.  PSYCHIATRIC: The patient is alert and oriented x 3.  SKIN: No obvious rash, lesion, or ulcer.   Physical Exam LABORATORY PANEL:   CBC Recent Labs  Lab 06/01/18 0440  WBC 6.7  HGB 10.7*  HCT 30.9*  PLT 220   ------------------------------------------------------------------------------------------------------------------  Chemistries  Recent Labs  Lab 05/31/18 1458  06/02/18 0507  NA 140   < > 138  K 2.2*   < > 3.7  CL 103   < > 106  CO2 29   < > 25  GLUCOSE 135*   < > 101*  BUN 20   < > 16  CREATININE 0.56   < > 0.60  CALCIUM 10.2   < > 9.5  MG 2.2   < > 2.1  AST 25  --   --   ALT 21  --   --   ALKPHOS 110  --   --   BILITOT 0.8  --   --    < > = values in this interval not displayed.   ------------------------------------------------------------------------------------------------------------------  Cardiac Enzymes No results for input(s): TROPONINI in the last 168 hours. ------------------------------------------------------------------------------------------------------------------  RADIOLOGY:  Dg Ankle Complete Right  Result Date: 05/31/2018 CLINICAL DATA:  Right knee and right ankle pain.  Fall. EXAM: RIGHT ANKLE - COMPLETE 3+ VIEW COMPARISON:  None. FINDINGS: No acute bony abnormality. Specifically, no fracture, subluxation, or dislocation. Soft tissues intact. IMPRESSION: No acute bony abnormality. Electronically Signed   By: Lennette Bihari  [place]over M.D.   On: 05/31/2018 20:01   Ct Head Wo Contrast  Result Date: 05/31/2018 CLINICAL DATA:  Head trauma.  Status post fall EXAM: CT HEAD WITHOUT CONTRAST TECHNIQUE: Contiguous axial images were obtained from the base of the skull through the vertex without intravenous contrast. COMPARISON:  None FINDINGS: Brain: No  evidence of acute infarction, hemorrhage, hydrocephalus, extra-axial collection or mass lesion/mass effect. There is mild diffuse low-attenuation within the subcortical and periventricular white matter compatible with chronic microvascular disease. There is marked prominence of the sulci and ventricles compatible with brain atrophy. Vascular: No hyperdense vessel or unexpected calcification. Skull: Normal. Negative for fracture or focal lesion. Sinuses/Orbits: No acute finding. Other: None IMPRESSION: 1. No acute intracranial abnormalities. 2. Chronic small vessel ischemic change and brain atrophy. Electronically Signed   By: Kerby Moors M.D.   On: 05/31/2018 19:37   Dg Knee Complete 4 Views Right  Result Date: 05/31/2018 CLINICAL DATA:  Fall, right knee and ankle pain EXAM: RIGHT KNEE - COMPLETE 4+ VIEW COMPARISON:  None. FINDINGS: Advanced tricompartment degenerative changes with joint space narrowing and spurring, most pronounced in the medial and lateral compartments. Chondrocalcinosis. No acute bony abnormality. Specifically, no fracture, subluxation, or dislocation. No joint effusion. IMPRESSION: Advanced tricompartment degenerative changes with chondrocalcinosis. No acute bony abnormality. Electronically Signed   By: Rolm Baptise M.D.   On: 05/31/2018 20:01    ASSESSMENT AND PLAN:   Active Problems:   Hypokalemia   Patient is 85 year old presenting with a fall noted to have severe hypokalemia  1.  Severe hypokalemia etiology unclear we will replace her potassium monitor potassium magnesium checked in normal Likely decreased oral intake.  2.  Hypertension continue Vasotec  3.  Glaucoma continue eyedrops  4.  Elevated blood sugar  Hemoglobin A1c is normal.  5.  Generalized weakness and falls-physical therapy suggested SNF placement.  All the records are reviewed and case discussed with Care Management/Social Workerr. Management plans discussed with the patient, family and  they are in agreement.  CODE STATUS: DNR  TOTAL TIME TAKING CARE OF THIS PATIENT: 35 minutes.    POSSIBLE D/C IN 1-2 DAYS, DEPENDING ON CLINICAL CONDITION.   [name] M.D on 06/02/2018   Between 7am to 6pm - Pager - [phone]  After 6pm go to [email or web address] - password [email or web address] [facility]  Office  [phone]  CC: Primary care physician; Baxter Hire, MD  Note: This dictation was prepared with Dragon dictation along with smaller phrase technology. Any transcriptional errors that result from this process are unintentional.

## 2018-06-02 NOTE — Progress Notes (Signed)
[facility] Clements at [place] NAME: Virginia Clements    MR#:  [id number]  DATE OF BIRTH:  October 30, 1929  SUBJECTIVE:  CHIEF COMPLAINT:   Chief Complaint  Patient presents with  . Fall   Came after recurrent fall and had generalized weakness noted to have hypokalemia.  Slightly better today. REVIEW OF SYSTEMS:  CONSTITUTIONAL: No fever, have fatigue or weakness.  EYES: No blurred or double vision.  EARS, NOSE, AND THROAT: No tinnitus or ear pain.  RESPIRATORY: No cough, shortness of breath, wheezing or hemoptysis.  CARDIOVASCULAR: No chest pain, orthopnea, edema.  GASTROINTESTINAL: No nausea, vomiting, diarrhea or abdominal pain.  GENITOURINARY: No dysuria, hematuria.  ENDOCRINE: No polyuria, nocturia,  HEMATOLOGY: No anemia, easy bruising or bleeding SKIN: No rash or lesion. MUSCULOSKELETAL: No joint pain or arthritis.   NEUROLOGIC: No tingling, numbness, have generalized weakness.  PSYCHIATRY: No anxiety or depression.   ROS  DRUG ALLERGIES:   Allergies  Allergen Reactions  . Alendronate Other (See Comments)    Nervousness and shakiness per pt  . Codeine     dizziness    VITALS:  Blood pressure (!) 170/90, pulse 66, temperature 98.6 F (37 C), resp. rate (!) 22, height 5\' 2"  (1.575 m), weight 68.5 kg, SpO2 93 %.  PHYSICAL EXAMINATION:  GENERAL:  85 y.o.-year-old patient lying in the bed with no acute distress.  EYES: Pupils equal, round, reactive to light and accommodation. No scleral icterus. Extraocular muscles intact.  HEENT: Head atraumatic, normocephalic. Oropharynx and nasopharynx clear.  NECK:  Supple, no jugular venous distention. No thyroid enlargement, no tenderness.  LUNGS: Normal breath sounds bilaterally, no wheezing, rales,rhonchi or crepitation. No use of accessory muscles of respiration.  CARDIOVASCULAR: S1, S2 normal. No murmurs, rubs, or gallops.  ABDOMEN: Soft, nontender, nondistended. Bowel sounds present. No  organomegaly or mass.  EXTREMITIES: No pedal edema, cyanosis, or clubbing.  NEUROLOGIC: Cranial nerves II through XII are intact. Muscle strength 4/5 in all extremities. Sensation intact. Gait not checked.  PSYCHIATRIC: The patient is alert and oriented x 3.  SKIN: No obvious rash, lesion, or ulcer.   Physical Exam LABORATORY PANEL:   CBC Recent Labs  Lab 06/01/18 0440  WBC 6.7  HGB 10.7*  HCT 30.9*  PLT 220   ------------------------------------------------------------------------------------------------------------------  Chemistries  Recent Labs  Lab 05/31/18 1458  06/02/18 0507  NA 140   < > 138  K 2.2*   < > 3.7  CL 103   < > 106  CO2 29   < > 25  GLUCOSE 135*   < > 101*  BUN 20   < > 16  CREATININE 0.56   < > 0.60  CALCIUM 10.2   < > 9.5  MG 2.2   < > 2.1  AST 25  --   --   ALT 21  --   --   ALKPHOS 110  --   --   BILITOT 0.8  --   --    < > = values in this interval not displayed.   ------------------------------------------------------------------------------------------------------------------  Cardiac Enzymes No results for input(s): TROPONINI in the last 168 hours. ------------------------------------------------------------------------------------------------------------------  RADIOLOGY:  Dg Ankle Complete Right  Result Date: 05/31/2018 CLINICAL DATA:  Right knee and right ankle pain.  Fall. EXAM: RIGHT ANKLE - COMPLETE 3+ VIEW COMPARISON:  None. FINDINGS: No acute bony abnormality. Specifically, no fracture, subluxation, or dislocation. Soft tissues intact. IMPRESSION: No acute bony abnormality. Electronically Signed   By: Lennette Bihari  Dover M.D.   On: 05/31/2018 20:01   Ct Head Wo Contrast  Result Date: 05/31/2018 CLINICAL DATA:  Head trauma.  Status post fall EXAM: CT HEAD WITHOUT CONTRAST TECHNIQUE: Contiguous axial images were obtained from the base of the skull through the vertex without intravenous contrast. COMPARISON:  None FINDINGS: Brain: No  evidence of acute infarction, hemorrhage, hydrocephalus, extra-axial collection or mass lesion/mass effect. There is mild diffuse low-attenuation within the subcortical and periventricular white matter compatible with chronic microvascular disease. There is marked prominence of the sulci and ventricles compatible with brain atrophy. Vascular: No hyperdense vessel or unexpected calcification. Skull: Normal. Negative for fracture or focal lesion. Sinuses/Orbits: No acute finding. Other: None IMPRESSION: 1. No acute intracranial abnormalities. 2. Chronic small vessel ischemic change and brain atrophy. Electronically Signed   By: Kerby Moors M.D.   On: 05/31/2018 19:37   Dg Knee Complete 4 Views Right  Result Date: 05/31/2018 CLINICAL DATA:  Fall, right knee and ankle pain EXAM: RIGHT KNEE - COMPLETE 4+ VIEW COMPARISON:  None. FINDINGS: Advanced tricompartment degenerative changes with joint space narrowing and spurring, most pronounced in the medial and lateral compartments. Chondrocalcinosis. No acute bony abnormality. Specifically, no fracture, subluxation, or dislocation. No joint effusion. IMPRESSION: Advanced tricompartment degenerative changes with chondrocalcinosis. No acute bony abnormality. Electronically Signed   By: Rolm Baptise M.D.   On: 05/31/2018 20:01    ASSESSMENT AND PLAN:   Active Problems:   Hypokalemia   Patient is 85 year old presenting with a fall noted to have severe hypokalemia  1.  Severe hypokalemia etiology unclear we will replace her potassium monitor potassium magnesium checked in normal Likely decreased oral intake.  2.  Hypertension continue Vasotec  3.  Glaucoma continue eyedrops  4.  Elevated blood sugar  Hemoglobin A1c is normal.  5.  Generalized weakness and falls-physical therapy evaluation.  All the records are reviewed and case discussed with Care Management/Social Workerr. Management plans discussed with the patient, family and they are in  agreement.  CODE STATUS: DNR  TOTAL TIME TAKING CARE OF THIS PATIENT: 35 minutes.     POSSIBLE D/C IN 1-2 DAYS, DEPENDING ON CLINICAL CONDITION.   [name] M.D on 06/02/2018   Between 7am to 6pm - Pager - [phone]  After 6pm go to [email or web address] - password [email or web address] [facility]  Office  [phone]  CC: Primary care physician; Baxter Hire, MD  Note: This dictation was prepared with Dragon dictation along with smaller phrase technology. Any transcriptional errors that result from this process are unintentional.

## 2018-06-03 LAB — BASIC METABOLIC PANEL
ANION GAP: 7 (ref 5–15)
BUN: 15 mg/dL (ref 8–23)
CO2: 28 mmol/L (ref 22–32)
Calcium: 9.9 mg/dL (ref 8.9–10.3)
Chloride: 104 mmol/L (ref 98–111)
Creatinine, Ser: 0.54 mg/dL (ref 0.44–1.00)
GFR calc Af Amer: >60 mL/min (ref >60)
GFR calc non Af Amer: >60 mL/min (ref >60)
Glucose, Bld: 114 mg/dL — ABNORMAL HIGH (ref 70–99)
Potassium: 3.7 mmol/L (ref 3.5–5.1)
Sodium: 139 mmol/L (ref 135–145)

## 2018-06-03 MED ORDER — METOPROLOL TARTRATE 25 MG PO TABS
25.0000 mg | ORAL_TABLET | Freq: Two times a day (BID) | ORAL | Status: DC
  Administered 2018-06-03 – 2018-06-04 (×3): 25 mg via ORAL
  Filled 2018-06-03 (×3): qty 1

## 2018-06-03 NOTE — Care Management Important Message (Signed)
Important Message  Patient Details  Name: Virginia Clements MRN: [id number] Date of Birth: [birth year]   Medicare Important Message Given:  Yes    [name] A Ladarious Kresse 06/03/2018, 11:24 AM

## 2018-06-03 NOTE — Clinical Social Work Note (Signed)
Clinical Social Work Assessment  Patient Details  Name: Virginia Clements MRN: [id number] Date of Birth: [birth year]  Date of referral:  06/03/18               Reason for consult:  Facility Placement                Permission sought to share information with:  Case Manager, Customer service manager, Family Supports Permission granted to share information::  Yes, Verbal Permission Granted  Name::      SNF  Agency::   [facility]   Relationship::     Contact Information:     Housing/Transportation Living arrangements for the past 2 months:  [place] of Information:  Patient Patient Interpreter Needed:  None Criminal Activity/Legal Involvement Pertinent to Current Situation/Hospitalization:  No - Comment as needed Significant Relationships:  Friend, Neighbor Lives with:  Self Do you feel safe going back to the place where you live?  Yes Need for family participation in patient care:  Yes (Comment)  Care giving concerns:  Patient lives alone in [place] Worker assessment / plan:  CSW consulted for SNF placement. CSW met with patient to discuss discharge planning. Patient reports that she lives alone and wants to go to rehab. CSW explained PT recommendation of SNF and answered questions regarding insurance. Patient agreeable to SNF and has chosen [facility]. CSW notified Claiborne Billings at [facility] of bed acceptance. CSW will continue to follow for discharge planning.   Employment status:  Retired Forensic scientist:  [employer] PT Recommendations:  [facility] / Referral to community resources:  [place]  Patient/Family's Response to care:  Patient thanked CSW for assistance   Patient/Family's Understanding of and Emotional Response to Diagnosis, Current Treatment, and Prognosis:  Patient is in agreement with discharge plan   Emotional Assessment Appearance:  Appears stated  age Attitude/Demeanor/Rapport:    Affect (typically observed):  Accepting, Pleasant Orientation:  Oriented to Self, Oriented to Place, Oriented to  Time Alcohol / Substance use:  Not Applicable Psych involvement (Current and /or in the community):  No (Comment)  Discharge Needs  Concerns to be addressed:  Discharge Planning Concerns Readmission within the last 30 days:  No Current discharge risk:  Dependent with Mobility, Lives alone Barriers to Discharge:  Continued Medical Work up   [employer], [place] 06/03/2018, 4:18 PM

## 2018-06-03 NOTE — Progress Notes (Signed)
[place] [facility] [place] NAME: Virginia Clements    MR#:  [id number]  DATE OF BIRTH:  [birth year]  SUBJECTIVE:  CHIEF COMPLAINT:   Chief Complaint  Patient presents with  . Fall   Came after recurrent fall and had generalized weakness noted to have hypokalemia.  Slightly better today. Remains weak. REVIEW OF SYSTEMS:  CONSTITUTIONAL: No fever, have fatigue or weakness.  EYES: No blurred or double vision.  EARS, NOSE, AND THROAT: No tinnitus or ear pain.  RESPIRATORY: No cough, shortness of breath, wheezing or hemoptysis.  CARDIOVASCULAR: No chest pain, orthopnea, edema.  GASTROINTESTINAL: No nausea, vomiting, diarrhea or abdominal pain.  GENITOURINARY: No dysuria, hematuria.  ENDOCRINE: No polyuria, nocturia,  HEMATOLOGY: No anemia, easy bruising or bleeding SKIN: No rash or lesion. MUSCULOSKELETAL: No joint pain or arthritis.   NEUROLOGIC: No tingling, numbness, have generalized weakness.  PSYCHIATRY: No anxiety or depression.   ROS  DRUG ALLERGIES:   Allergies  Allergen Reactions  . Alendronate Other (See Comments)    Nervousness and shakiness per pt  . Codeine     dizziness    VITALS:  Blood pressure (!) 183/85, pulse 75, temperature 98.1 F (36.7 C), temperature source Oral, resp. rate 20, height 5\' 2"  (1.575 m), weight 68.5 kg, SpO2 95 %.  PHYSICAL EXAMINATION:  GENERAL:  85 y.o.-year-old patient lying in the bed with no acute distress.  EYES: Pupils equal, round, reactive to light and accommodation. No scleral icterus. Extraocular muscles intact.  HEENT: Head atraumatic, normocephalic. Oropharynx and nasopharynx clear.  NECK:  Supple, no jugular venous distention. No thyroid enlargement, no tenderness.  LUNGS: Normal breath sounds bilaterally, no wheezing, rales,rhonchi or crepitation. No use of accessory muscles of respiration.  CARDIOVASCULAR: S1, S2 normal. No murmurs, rubs, or gallops.  ABDOMEN: Soft, nontender,  nondistended. Bowel sounds present. No organomegaly or mass.  EXTREMITIES: No pedal edema, cyanosis, or clubbing.  NEUROLOGIC: Cranial nerves II through XII are intact. Muscle strength 4/5 in all extremities. Sensation intact. Gait not checked.  PSYCHIATRIC: The patient is alert and oriented x 3.  SKIN: No obvious rash, lesion, or ulcer.   Physical Exam LABORATORY PANEL:   CBC Recent Labs  Lab 06/01/18 0440  WBC 6.7  HGB 10.7*  HCT 30.9*  PLT 220   ------------------------------------------------------------------------------------------------------------------  Chemistries  Recent Labs  Lab 05/31/18 1458  06/02/18 0507 06/03/18 0450  NA 140   < > 138 139  K 2.2*   < > 3.7 3.7  CL 103   < > 106 104  CO2 29   < > 25 28  GLUCOSE 135*   < > 101* 114*  BUN 20   < > 16 15  CREATININE 0.56   < > 0.60 0.54  CALCIUM 10.2   < > 9.5 9.9  MG 2.2   < > 2.1  --   AST 25  --   --   --   ALT 21  --   --   --   ALKPHOS 110  --   --   --   BILITOT 0.8  --   --   --    < > = values in this interval not displayed.   ------------------------------------------------------------------------------------------------------------------  Cardiac Enzymes No results for input(s): TROPONINI in the last 168 hours. ------------------------------------------------------------------------------------------------------------------  RADIOLOGY:  No results found.  ASSESSMENT AND PLAN:   Active Problems:   Hypokalemia   Patient is 85 year old presenting with a fall noted to  have severe hypokalemia  1.  Severe hypokalemia etiology unclear we will replace her potassium monitor potassium magnesium checked in normal Likely decreased oral intake. Improved now.  2.  Hypertension continue Vasotec  3.  Glaucoma continue eyedrops  4.  Elevated blood sugar  Hemoglobin A1c is normal.  5.  Generalized weakness and falls-physical therapy suggested SNF placement.  Spoke to Education officer, museum for  help.  All the records are reviewed and case discussed with Care Management/Social Workerr. Management plans discussed with the patient, family and they are in agreement.  CODE STATUS: DNR  TOTAL TIME TAKING CARE OF THIS PATIENT: 35 minutes.    POSSIBLE D/C IN 1-2 DAYS, DEPENDING ON CLINICAL CONDITION.   Virginia Clements M.D on 06/03/2018   Between 7am to 6pm - Pager - [phone]  After 6pm go to [email or web address] - password [email or web address] [facility]  Office  [phone]  CC: Primary care physician; Virginia Hire, MD  Note: This dictation was prepared with Dragon dictation along with smaller phrase technology. Any transcriptional errors that result from this process are unintentional.

## 2018-06-03 NOTE — NC FL2 (Signed)
[place] LEVEL OF CARE SCREENING TOOL     IDENTIFICATION  Patient Name: Virginia Clements Birthdate: 1929/11/06 Sex: female Admission Date (Current Location): 05/31/2018  [place] and [place] Number:  Engineering geologist and Address:  [facility], [address], [place], [place] [address]      Provider Number: [id number]  Attending Physician Name and Address:  [name], *  Relative Name and Phone Number:       Current Level of Care: Hospital Recommended Level of Care: [place] Prior Approval Number:    Date Approved/Denied:   PASRR Number: [id number]  Discharge Plan: SNF    Current Diagnoses: Patient Active Problem List   Diagnosis Date Noted  . Hypokalemia 05/31/2018    Orientation RESPIRATION BLADDER Height & Weight     Self, Place, Time  Normal Incontinent Weight: 151 lb (68.5 kg) Height:  5\' 2"  (157.5 cm)  BEHAVIORAL SYMPTOMS/MOOD NEUROLOGICAL BOWEL NUTRITION STATUS  (none) (none) Continent Diet(Heart Healthy )  AMBULATORY STATUS COMMUNICATION OF NEEDS Skin   Extensive Assist Verbally Normal                       Personal Care Assistance Level of Assistance  Bathing, Feeding, Dressing Bathing Assistance: Limited assistance Feeding assistance: Independent Dressing Assistance: Limited assistance     Functional Limitations Info  Sight, Hearing, Speech Sight Info: Adequate Hearing Info: Adequate Speech Info: Adequate    SPECIAL CARE FACTORS FREQUENCY  PT (By licensed PT), OT (By licensed OT)     PT Frequency: 5 OT Frequency: 5            Contractures Contractures Info: Not present    Additional Factors Info  Code Status, Allergies Code Status Info: DNR Allergies Info: Alendronate, Codeine           Current Medications (06/03/2018):  This is the current hospital active medication list Current Facility-Administered Medications  Medication Dose Route  Frequency Provider Last Rate Last Dose  . 0.9 %  sodium chloride infusion  250 mL Intravenous PRN Dustin Flock, MD      . acetaminophen (TYLENOL) tablet 650 mg  650 mg Oral Q6H PRN Dustin Flock, MD   650 mg at 06/03/18 1150   Or  . acetaminophen (TYLENOL) suppository 650 mg  650 mg Rectal Q6H PRN Dustin Flock, MD      . amLODipine (NORVASC) tablet 10 mg  10 mg Oral Daily [name], MD   10 mg at 06/03/18 1150  . aspirin EC tablet 325 mg  325 mg Oral Daily Dustin Flock, MD   325 mg at 06/03/18 1150  . calcium carbonate (TUMS - dosed in mg elemental calcium) chewable tablet 500 mg  500 mg Oral Daily Dustin Flock, MD   500 mg at 06/03/18 1150  . cholecalciferol (VITAMIN D3) tablet 1,000 Units  1,000 Units Oral Daily Dustin Flock, MD   1,000 Units at 06/03/18 1150  . enalapril (VASOTEC) tablet 10 mg  10 mg Oral Daily Dustin Flock, MD   10 mg at 06/03/18 1150  . enoxaparin (LOVENOX) injection 40 mg  40 mg Subcutaneous Q24H Charlett Nose, RPH   40 mg at 06/02/18 2156  . metoprolol tartrate (LOPRESSOR) tablet 25 mg  25 mg Oral BID [name], MD   25 mg at 06/03/18 1150  . multivitamin with minerals tablet 1 tablet  1 tablet Oral Daily Dustin Flock, MD   1 tablet at 06/03/18 1150  .  ondansetron (ZOFRAN) tablet 4 mg  4 mg Oral Q6H PRN Dustin Flock, MD       Or  . ondansetron [facility]) injection 4 mg  4 mg Intravenous Q6H PRN Dustin Flock, MD      . sodium chloride flush (NS) 0.9 % injection 3 mL  3 mL Intravenous Q12H Dustin Flock, MD   3 mL at 06/03/18 1151  . sodium chloride flush (NS) 0.9 % injection 3 mL  3 mL Intravenous PRN Dustin Flock, MD      . timolol (TIMOPTIC) 0.5 % ophthalmic solution 1 drop  1 drop Both Eyes QHS Dustin Flock, MD   1 drop at 06/02/18 2156     Discharge Medications: Please see discharge summary for a list of discharge medications.  Relevant Imaging Results:  Relevant Lab Results:   Additional  Information SSN: [id number]  Annamaria Boots, [place]

## 2018-06-04 MED ORDER — AMLODIPINE BESYLATE 10 MG PO TABS: 10.0000 mg | ORAL_TABLET | Freq: Every day | ORAL | 0 refills | Status: DC

## 2018-06-04 MED ORDER — ACETAMINOPHEN 325 MG PO TABS: 650.0000 mg | ORAL_TABLET | Freq: Four times a day (QID) | ORAL | 0 refills | Status: AC | PRN

## 2018-06-04 MED ORDER — METOPROLOL TARTRATE 25 MG PO TABS: 25.0000 mg | ORAL_TABLET | Freq: Two times a day (BID) | ORAL | 0 refills | Status: DC

## 2018-06-04 NOTE — Clinical Social Work Note (Signed)
Patient is medically ready for discharge today. Patient will be discharged to [facility]. CSW notified patient of discharge today. CSW also notified Claiborne Billings at [facility] of discharge today. Patient will be transported by EMS. RN to call report and call for transport.   [place], [place]

## 2018-06-04 NOTE — Progress Notes (Signed)
Ems here to transport pt to [facility].

## 2018-06-04 NOTE — Plan of Care (Signed)

## 2018-06-04 NOTE — Progress Notes (Addendum)
Pt for discharge to [facility] via ems . Report to mimi at [facility].  Ems called and pt 2nd in line. Sl d/cd.  Pt in transport pack.

## 2018-06-04 NOTE — Discharge Summary (Signed)
[place] [facility] [place] NAME: Virginia Clements    MR#:  [id number]  DATE OF BIRTH:  Oct 15, 1929  DATE OF ADMISSION:  05/31/2018 ADMITTING PHYSICIAN: Dustin Flock, MD  DATE OF DISCHARGE: 06/04/2018   PRIMARY CARE PHYSICIAN: Baxter Hire, MD    ADMISSION DIAGNOSIS:  Hypokalemia [E87.6] Weakness [R53.1] Fall, initial encounter [W19.XXXA]  DISCHARGE DIAGNOSIS:  Active Problems:   Hypokalemia   SECONDARY DIAGNOSIS:   Past Medical History:  Diagnosis Date  . Breast cancer ([place])   . Hypertension   . Hypoglycemia   . Radiation 2009    HOSPITAL COURSE:   Patient is 85 year old presenting with a fall noted to have severe hypokalemia  1.Severe hypokalemia etiology unclear we will replace her potassium monitor potassium magnesium checked in normal Likely decreased oral intake. Improved now.  2. Hypertension continue Vasotec  3.Glaucoma continue eyedrops  4.Elevated blood sugar  Hemoglobin A1c is normal.  5.  Generalized weakness and falls-physical therapy suggested SNF placement.  Spoke to Education officer, museum for help.  DISCHARGE CONDITIONS:   Stable.  CONSULTS OBTAINED:    DRUG ALLERGIES:   Allergies  Allergen Reactions  . Alendronate Other (See Comments)    Nervousness and shakiness per pt  . Codeine     dizziness    DISCHARGE MEDICATIONS:   Allergies as of 06/04/2018      Reactions   Alendronate Other (See Comments)   Nervousness and shakiness per pt   Codeine    dizziness      Medication List    TAKE these medications   acetaminophen 325 MG tablet Commonly known as:  TYLENOL Take 2 tablets (650 mg total) by mouth every 6 (six) hours as needed for mild pain (or Fever >/= 101).   amLODipine 10 MG tablet Commonly known as:  NORVASC Take 1 tablet (10 mg total) by mouth daily. Start taking on:  June 05, 2018   aspirin EC 325 MG tablet Take 325 mg by mouth daily.   CALCIUM 600  PO Take 600 mg by mouth daily.   enalapril 10 MG tablet Commonly known as:  VASOTEC Take 10 mg by mouth daily.   metoprolol tartrate 25 MG tablet Commonly known as:  LOPRESSOR Take 1 tablet (25 mg total) by mouth 2 (two) times daily.   multivitamin tablet Take 1 tablet by mouth daily.   naproxen 375 MG tablet Commonly known as:  NAPROSYN Take 375 mg by mouth 2 (two) times daily with a meal.   timolol 0.5 % ophthalmic gel-forming Commonly known as:  TIMOPTIC-XR Place 1 drop into both eyes at bedtime.   Vitamin D3 25 MCG (1000 UT) Caps Take 1,000 Units by mouth daily.        DISCHARGE INSTRUCTIONS:    Follow with PMD in 1-2 weeks.  If you experience worsening of your admission symptoms, develop shortness of breath, life threatening emergency, suicidal or homicidal thoughts you must seek medical attention immediately by calling 911 or calling your MD immediately  if symptoms less severe.  You Must read complete instructions/literature along with all the possible adverse reactions/side effects for all the Medicines you take and that have been prescribed to you. Take any new Medicines after you have completely understood and accept all the possible adverse reactions/side effects.   Please note  You were cared for by a hospitalist during your hospital stay. If you have any questions about your discharge medications or the care you received while you  were in the hospital after you are discharged, you can call the unit and asked to speak with the hospitalist on call if the hospitalist that took care of you is not available. Once you are discharged, your primary care physician will handle any further medical issues. Please note that NO REFILLS for any discharge medications will be authorized once you are discharged, as it is imperative that you return to your primary care physician (or establish a relationship with a primary care physician if you do not have one) for your aftercare  needs so that they can reassess your need for medications and monitor your lab values.    Today   CHIEF COMPLAINT:   Chief Complaint  Patient presents with  . Fall    HISTORY OF PRESENT ILLNESS:  Virginia Clements  is a 85 y.o. female with a known history of hypertension, history of breast cancer presenting after a fall.  Patient states that she lives by herself and uses a walker.  And she fell.  She was having pain in her knee after the fall however when evaluated in the ER she was noted to have severe hypokalemia.  Patient not taking any diuretics.  She denies any diarrhea or emesis.  She complains of generalized weakness and fatigue ongoing for weeks.  Patient's potassium previously was normal.   VITAL SIGNS:  Blood pressure (!) 187/89, pulse 69, temperature 98.1 F (36.7 C), resp. rate 19, height 5\' 2"  (1.575 m), weight 68.5 kg, SpO2 96 %.  I/O:    Intake/Output Summary (Last 24 hours) at 06/04/2018 1045 Last data filed at 06/03/2018 1945 Gross per 24 hour  Intake 0 ml  Output 750 ml  Net -750 ml    PHYSICAL EXAMINATION:   GENERAL:  85 y.o.-year-old patient lying in the bed with no acute distress.  EYES: Pupils equal, round, reactive to light and accommodation. No scleral icterus. Extraocular muscles intact.  HEENT: Head atraumatic, normocephalic. Oropharynx and nasopharynx clear.  NECK:  Supple, no jugular venous distention. No thyroid enlargement, no tenderness.  LUNGS: Normal breath sounds bilaterally, no wheezing, rales,rhonchi or crepitation. No use of accessory muscles of respiration.  CARDIOVASCULAR: S1, S2 normal. No murmurs, rubs, or gallops.  ABDOMEN: Soft, nontender, nondistended. Bowel sounds present. No organomegaly or mass.  EXTREMITIES: No pedal edema, cyanosis, or clubbing.  NEUROLOGIC: Cranial nerves II through XII are intact. Muscle strength 4/5 in all extremities. Sensation intact. Gait not checked.  PSYCHIATRIC: The patient is alert and oriented x 3.   SKIN: No obvious rash, lesion, or ulcer.   DATA REVIEW:   CBC Recent Labs  Lab 06/01/18 0440  WBC 6.7  HGB 10.7*  HCT 30.9*  PLT 220    Chemistries  Recent Labs  Lab 05/31/18 1458  06/02/18 0507 06/03/18 0450  NA 140   < > 138 139  K 2.2*   < > 3.7 3.7  CL 103   < > 106 104  CO2 29   < > 25 28  GLUCOSE 135*   < > 101* 114*  BUN 20   < > 16 15  CREATININE 0.56   < > 0.60 0.54  CALCIUM 10.2   < > 9.5 9.9  MG 2.2   < > 2.1  --   AST 25  --   --   --   ALT 21  --   --   --   ALKPHOS 110  --   --   --   BILITOT  0.8  --   --   --    < > = values in this interval not displayed.    Cardiac Enzymes No results for input(s): TROPONINI in the last 168 hours.  Microbiology Results  No results found for this or any previous visit.  RADIOLOGY:  No results found.  EKG:   Orders placed or performed during the hospital encounter of 05/31/18  . EKG 12-Lead  . EKG 12-Lead      Management plans discussed with the patient, family and they are in agreement.  CODE STATUS: DNR    Code Status Orders  (From admission, onward)         Start     Ordered   05/31/18 2311  Do not attempt resuscitation (DNR)  Continuous    Question Answer Comment  In the event of cardiac or respiratory ARREST Do not call a "code blue"   In the event of cardiac or respiratory ARREST Do not perform Intubation, CPR, defibrillation or ACLS   In the event of cardiac or respiratory ARREST Use medication by any route, position, wound care, and other measures to relive pain and suffering. May use oxygen, suction and manual treatment of airway obstruction as needed for comfort.      05/31/18 2310        Code Status History    Date Active Date Inactive Code Status Order ID Comments User Context   05/31/2018 2239 05/31/2018 2310 DNR [id number]  Dustin Flock, MD ED    Advance Directive Documentation     Most Recent Value  Type of Advance Directive  Living will  Pre-existing out of facility DNR  order (yellow form or pink MOST form)  -  "MOST" Form in Place?  -      TOTAL TIME TAKING CARE OF THIS PATIENT: 35 minutes.    [name] M.D on 06/04/2018 at 10:45 AM  Between 7am to 6pm - Pager - [phone]  After 6pm go to [email or web address] - password [email or web address] [facility]  Office  [phone]  CC: Primary care physician; Baxter Hire, MD   Note: This dictation was prepared with Dragon dictation along with smaller phrase technology. Any transcriptional errors that result from this process are unintentional.

## 2020-02-17 IMAGING — CR DG KNEE COMPLETE 4+V*R*
4 series · 4 of 4 positions shown · non-contrast
Comparison: None.

CLINICAL DATA: Fall, right knee and ankle pain

EXAM:
RIGHT KNEE - COMPLETE 4+ VIEW

[knee ap]
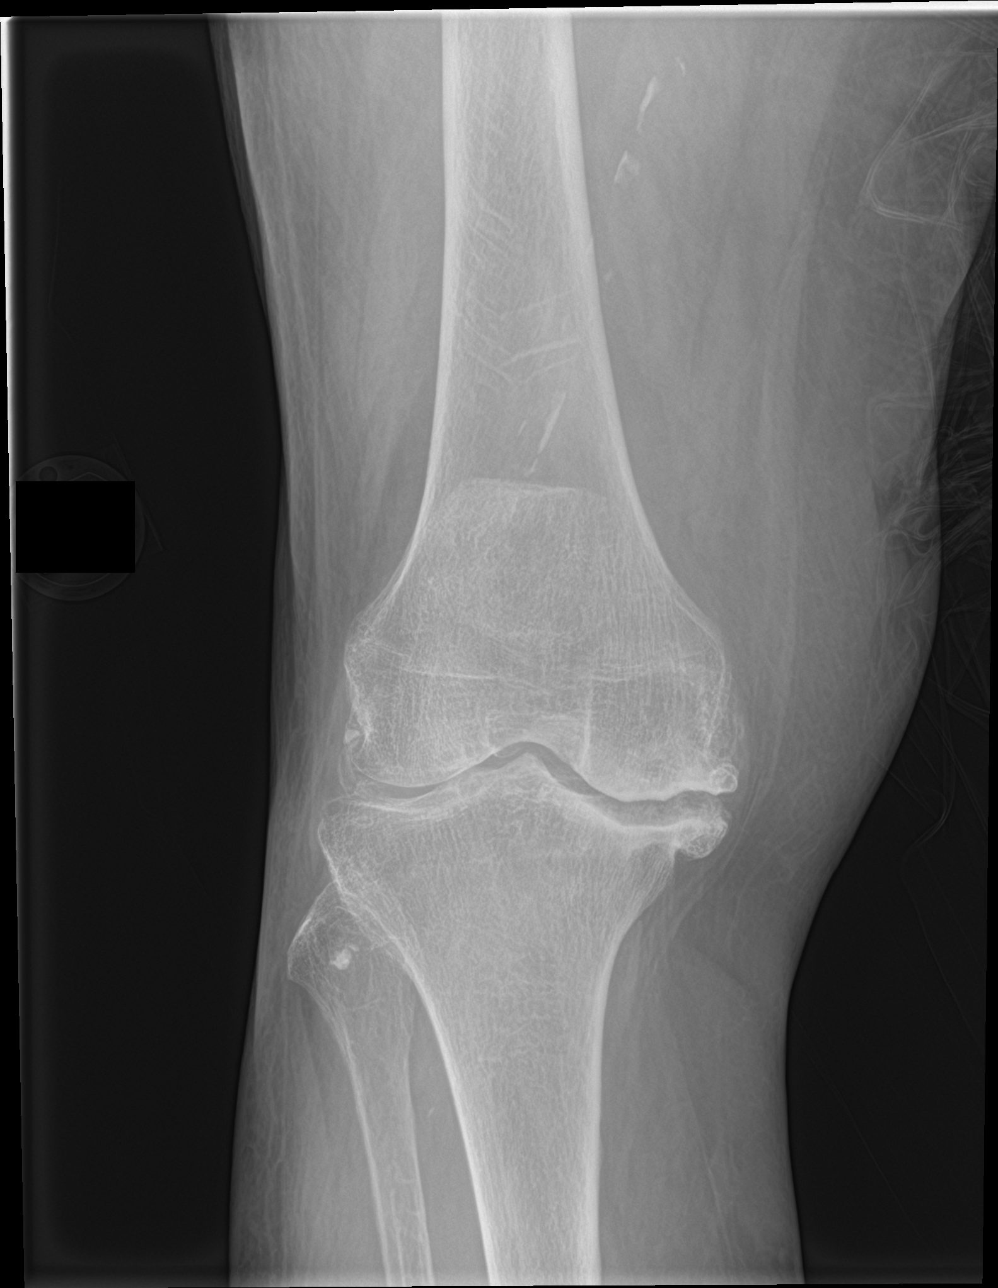

[knee obl (1 of 2)]
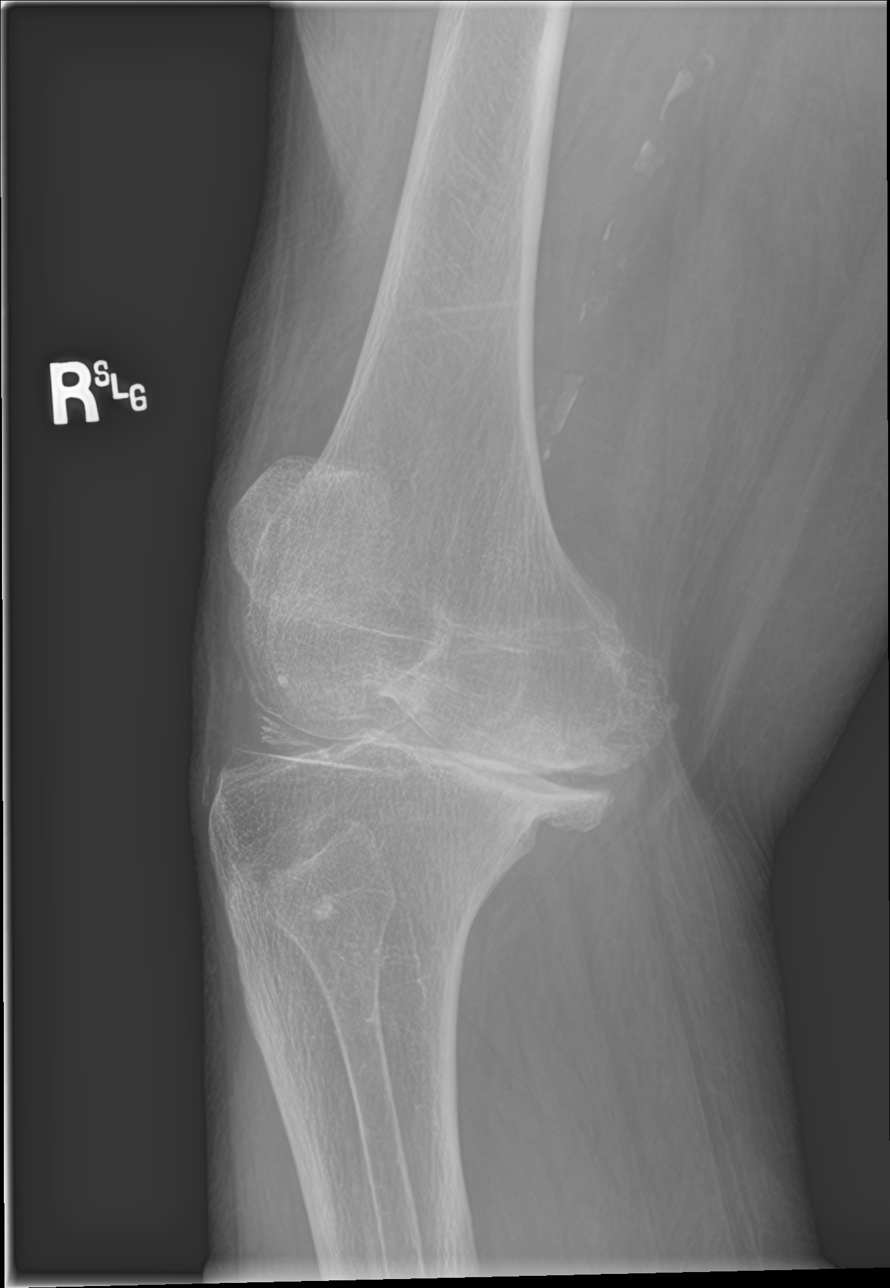

[knee obl (2 of 2)]
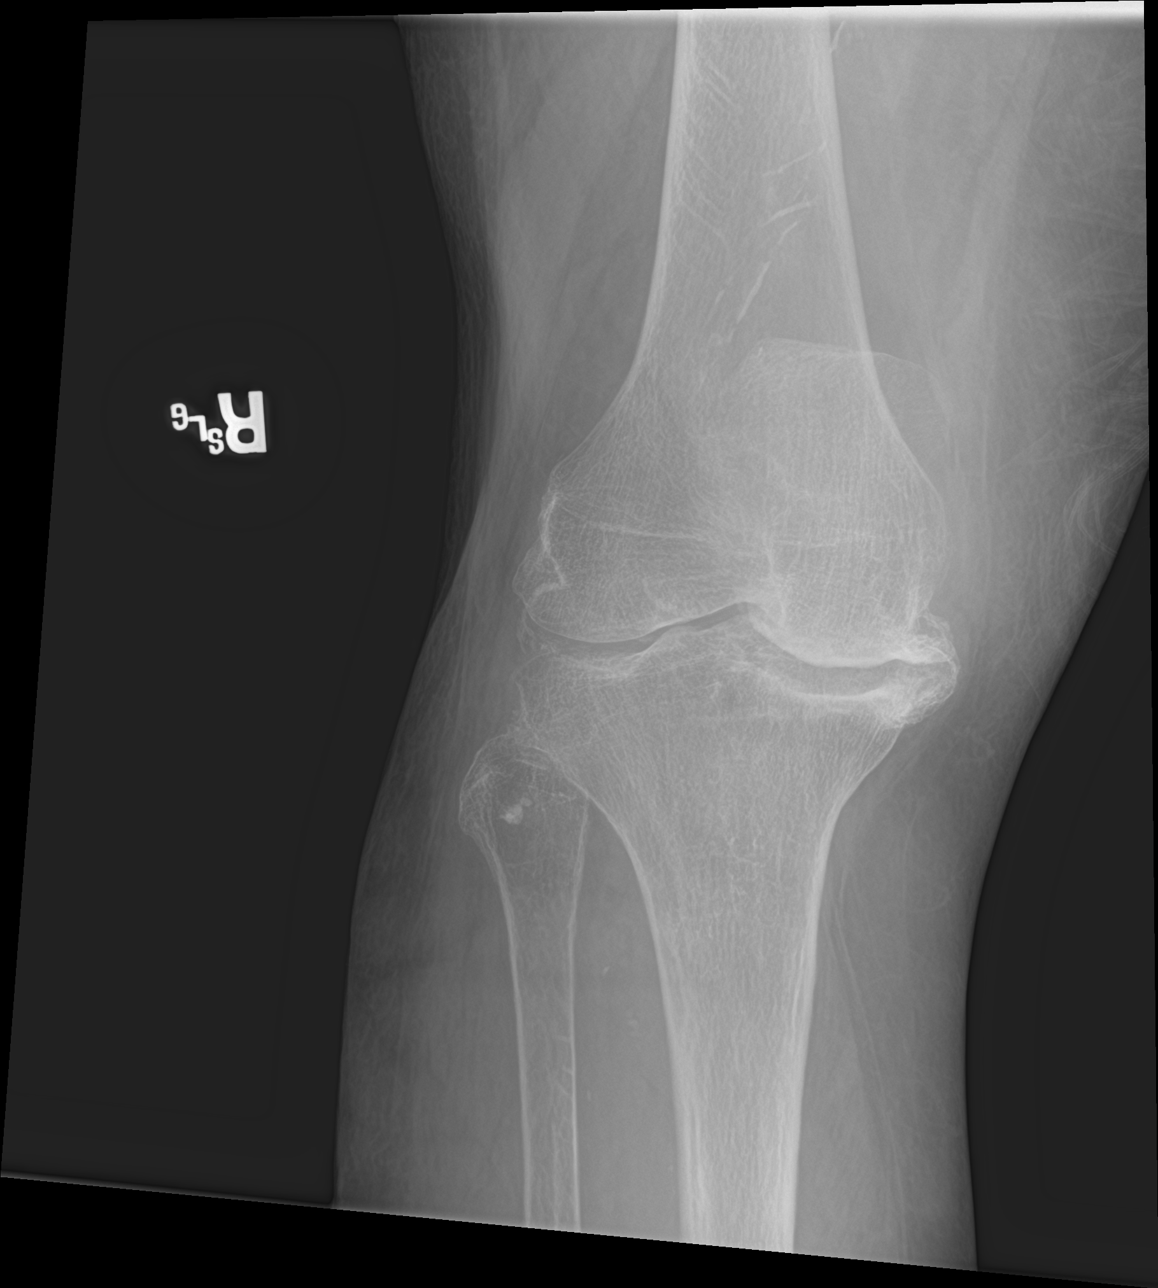

[knee lat]
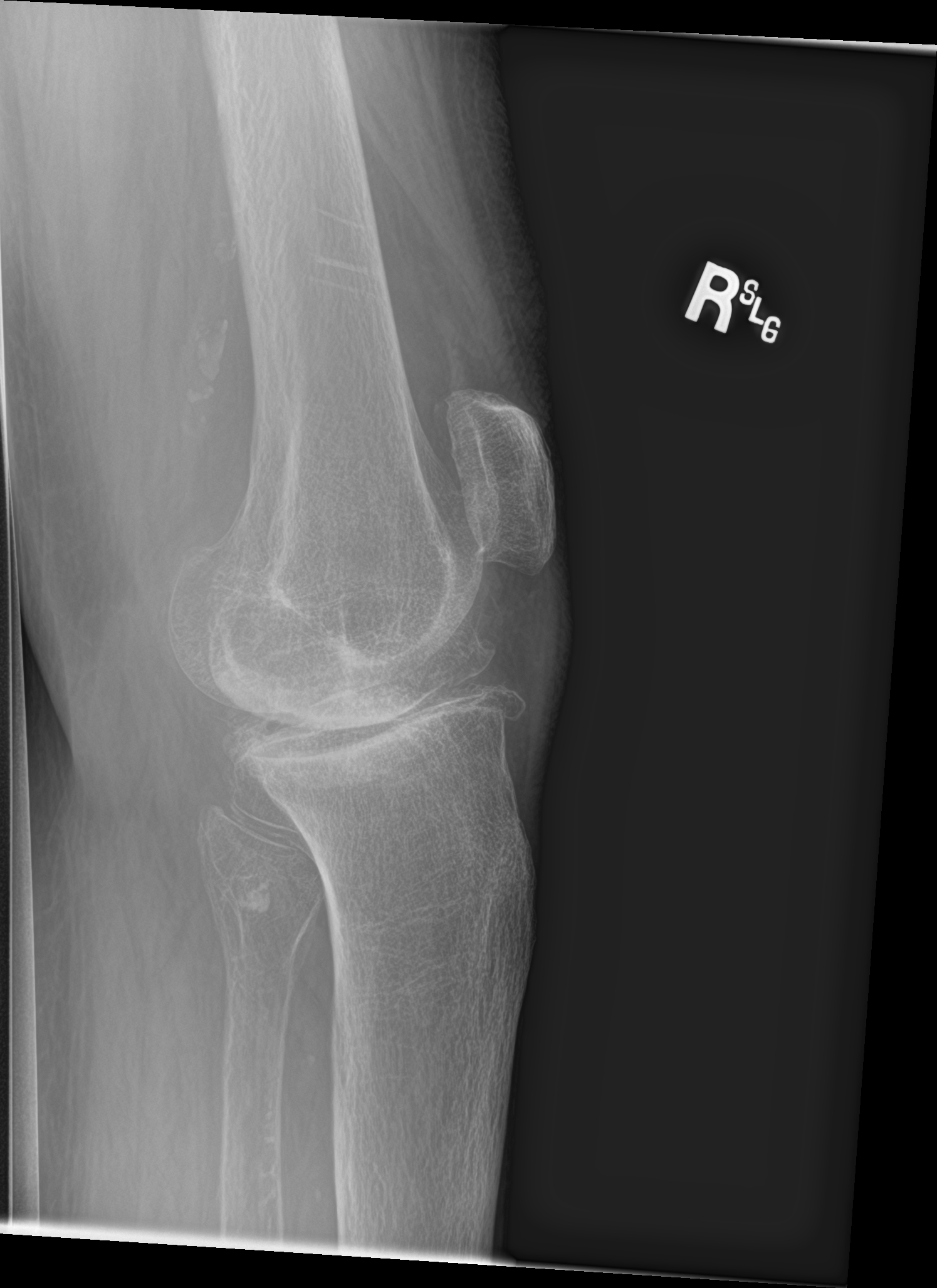

[4 of 4 positions shown; findings below may reference images not displayed]

FINDINGS: Advanced tricompartment degenerative changes with joint space
narrowing and spurring, most pronounced in the medial and lateral
compartments. Chondrocalcinosis. No acute bony abnormality.
Specifically, no fracture, subluxation, or dislocation. No joint
effusion.
IMPRESSION: Advanced tricompartment degenerative changes with chondrocalcinosis.
No acute bony abnormality.

## 2020-06-15 ENCOUNTER — Encounter: Payer: Self-pay | Admitting: Emergency Medicine

## 2020-06-15 ENCOUNTER — Inpatient Hospital Stay
Admission: EM | Admit: 2020-06-15 | Discharge: 2020-06-18 | DRG: 871 | Disposition: A | Discharge status: Skilled Nursing Facility | Payer: Medicare Other | Attending: Internal Medicine | Admitting: Internal Medicine

## 2020-06-15 ENCOUNTER — Emergency Department: Payer: Medicare Other

## 2020-06-15 ENCOUNTER — Observation Stay: Payer: Medicare Other

## 2020-06-15 ENCOUNTER — Other Ambulatory Visit: Payer: Self-pay

## 2020-06-15 DIAGNOSIS — Z66 Do not resuscitate: Secondary | ICD-10-CM | POA: Diagnosis present

## 2020-06-15 DIAGNOSIS — A419 Sepsis, unspecified organism: Principal | ICD-10-CM | POA: Diagnosis present

## 2020-06-15 DIAGNOSIS — Z7982 Long term (current) use of aspirin: Secondary | ICD-10-CM

## 2020-06-15 DIAGNOSIS — Z7401 Bed confinement status: Secondary | ICD-10-CM

## 2020-06-15 DIAGNOSIS — Z6835 Body mass index (BMI) 35.0-35.9, adult: Secondary | ICD-10-CM

## 2020-06-15 DIAGNOSIS — E669 Obesity, unspecified: Secondary | ICD-10-CM | POA: Diagnosis present

## 2020-06-15 DIAGNOSIS — I10 Essential (primary) hypertension: Secondary | ICD-10-CM

## 2020-06-15 DIAGNOSIS — J9601 Acute respiratory failure with hypoxia: Secondary | ICD-10-CM | POA: Diagnosis not present

## 2020-06-15 DIAGNOSIS — W06XXXA Fall from bed, initial encounter: Secondary | ICD-10-CM | POA: Diagnosis present

## 2020-06-15 DIAGNOSIS — E1159 Type 2 diabetes mellitus with other circulatory complications: Secondary | ICD-10-CM

## 2020-06-15 DIAGNOSIS — N3001 Acute cystitis with hematuria: Secondary | ICD-10-CM

## 2020-06-15 DIAGNOSIS — N390 Urinary tract infection, site not specified: Secondary | ICD-10-CM | POA: Diagnosis present

## 2020-06-15 DIAGNOSIS — Z993 Dependence on wheelchair: Secondary | ICD-10-CM

## 2020-06-15 DIAGNOSIS — N179 Acute kidney failure, unspecified: Secondary | ICD-10-CM

## 2020-06-15 DIAGNOSIS — R112 Nausea with vomiting, unspecified: Secondary | ICD-10-CM

## 2020-06-15 DIAGNOSIS — E785 Hyperlipidemia, unspecified: Secondary | ICD-10-CM | POA: Diagnosis present

## 2020-06-15 DIAGNOSIS — E8770 Fluid overload, unspecified: Secondary | ICD-10-CM | POA: Diagnosis present

## 2020-06-15 DIAGNOSIS — M810 Age-related osteoporosis without current pathological fracture: Secondary | ICD-10-CM | POA: Diagnosis present

## 2020-06-15 DIAGNOSIS — R1110 Vomiting, unspecified: Secondary | ICD-10-CM

## 2020-06-15 DIAGNOSIS — E860 Dehydration: Secondary | ICD-10-CM | POA: Diagnosis present

## 2020-06-15 DIAGNOSIS — Z853 Personal history of malignant neoplasm of breast: Secondary | ICD-10-CM

## 2020-06-15 DIAGNOSIS — Z23 Encounter for immunization: Secondary | ICD-10-CM

## 2020-06-15 DIAGNOSIS — D472 Monoclonal gammopathy: Secondary | ICD-10-CM | POA: Diagnosis present

## 2020-06-15 DIAGNOSIS — B9620 Unspecified Escherichia coli [E. coli] as the cause of diseases classified elsewhere: Secondary | ICD-10-CM | POA: Diagnosis present

## 2020-06-15 DIAGNOSIS — E119 Type 2 diabetes mellitus without complications: Secondary | ICD-10-CM

## 2020-06-15 DIAGNOSIS — I119 Hypertensive heart disease without heart failure: Secondary | ICD-10-CM | POA: Diagnosis present

## 2020-06-15 DIAGNOSIS — Z87891 Personal history of nicotine dependence: Secondary | ICD-10-CM

## 2020-06-15 DIAGNOSIS — Z79899 Other long term (current) drug therapy: Secondary | ICD-10-CM

## 2020-06-15 DIAGNOSIS — B9689 Other specified bacterial agents as the cause of diseases classified elsewhere: Secondary | ICD-10-CM | POA: Diagnosis present

## 2020-06-15 DIAGNOSIS — Z6829 Body mass index (BMI) 29.0-29.9, adult: Secondary | ICD-10-CM

## 2020-06-15 DIAGNOSIS — Z923 Personal history of irradiation: Secondary | ICD-10-CM

## 2020-06-15 DIAGNOSIS — R531 Weakness: Secondary | ICD-10-CM

## 2020-06-15 DIAGNOSIS — Z885 Allergy status to narcotic agent status: Secondary | ICD-10-CM

## 2020-06-15 DIAGNOSIS — Z20822 Contact with and (suspected) exposure to covid-19: Secondary | ICD-10-CM | POA: Diagnosis present

## 2020-06-15 DIAGNOSIS — Z888 Allergy status to other drugs, medicaments and biological substances status: Secondary | ICD-10-CM

## 2020-06-15 DIAGNOSIS — I152 Hypertension secondary to endocrine disorders: Secondary | ICD-10-CM

## 2020-06-15 DIAGNOSIS — Z8249 Family history of ischemic heart disease and other diseases of the circulatory system: Secondary | ICD-10-CM

## 2020-06-15 LAB — CBC
HCT: 39.8 % (ref 36.0–46.0)
Hemoglobin: 13 g/dL (ref 12.0–15.0)
MCH: 28.4 pg (ref 26.0–34.0)
MCHC: 32.7 g/dL (ref 30.0–36.0)
MCV: 87.1 fL (ref 80.0–100.0)
Platelets: 227 10*3/uL (ref 150–400)
RBC: 4.57 MIL/uL (ref 3.87–5.11)
RDW: 13.6 % (ref 11.5–15.5)
WBC: 13.6 10*3/uL — ABNORMAL HIGH (ref 4.0–10.5)
nRBC: 0 % (ref 0.0–0.2)

## 2020-06-15 LAB — RESP PANEL BY RT-PCR (FLU A&B, COVID) ARPGX2
Influenza A by PCR: NEGATIVE
Influenza B by PCR: NEGATIVE
SARS Coronavirus 2 by RT PCR: NEGATIVE

## 2020-06-15 LAB — COMPREHENSIVE METABOLIC PANEL
ALT: 17 U/L (ref 0–44)
AST: 23 U/L (ref 15–41)
Albumin: 3.6 g/dL (ref 3.5–5.0)
Alkaline Phosphatase: 75 U/L (ref 38–126)
Anion gap: 11 (ref 5–15)
BUN: 25 mg/dL — ABNORMAL HIGH (ref 8–23)
CO2: 24 mmol/L (ref 22–32)
Calcium: 9.9 mg/dL (ref 8.9–10.3)
Chloride: 102 mmol/L (ref 98–111)
Creatinine, Ser: 0.82 mg/dL (ref 0.44–1.00)
GFR, Estimated: >60 mL/min (ref >60)
Glucose, Bld: 184 mg/dL — ABNORMAL HIGH (ref 70–99)
Potassium: 3.7 mmol/L (ref 3.5–5.1)
Sodium: 137 mmol/L (ref 135–145)
Total Bilirubin: 1.2 mg/dL (ref 0.3–1.2)
Total Protein: 6.9 g/dL (ref 6.5–8.1)

## 2020-06-15 LAB — URINALYSIS, COMPLETE (UACMP) WITH MICROSCOPIC
Bilirubin Urine: NEGATIVE
Glucose, UA: NEGATIVE mg/dL
Ketones, ur: NEGATIVE mg/dL
Nitrite: POSITIVE — AB
Protein, ur: 100 mg/dL — AB
Specific Gravity, Urine: 1.015 (ref 1.005–1.030)
Squamous Epithelial / HPF: NONE SEEN (ref 0–5)
WBC, UA: >50 WBC/hpf — ABNORMAL HIGH (ref 0–5)
pH: 5 (ref 5.0–8.0)

## 2020-06-15 LAB — TROPONIN I (HIGH SENSITIVITY)
Troponin I (High Sensitivity): 8 ng/L (ref <18)
Troponin I (High Sensitivity): 9 ng/L (ref <18)

## 2020-06-15 LAB — TSH: TSH: 0.619 u[IU]/mL (ref 0.350–4.500)

## 2020-06-15 LAB — BRAIN NATRIURETIC PEPTIDE: B Natriuretic Peptide: 86.4 pg/mL (ref 0.0–100.0)

## 2020-06-15 LAB — LIPASE, BLOOD: Lipase: 28 U/L (ref 11–51)

## 2020-06-15 MED ORDER — SODIUM CHLORIDE 0.9 % IV SOLN: 250.0000 mL | INTRAVENOUS | Status: DC | PRN

## 2020-06-15 MED ORDER — TIMOLOL MALEATE 0.5 % OP SOLN
1.0000 [drp] | Freq: Every day | OPHTHALMIC | Status: DC
  Administered 2020-06-15: 1 [drp] via OPHTHALMIC
  Filled 2020-06-15 (×2): qty 5

## 2020-06-15 MED ORDER — SODIUM CHLORIDE 0.9% FLUSH
3.0000 mL | Freq: Two times a day (BID) | INTRAVENOUS | Status: DC
  Administered 2020-06-15 – 2020-06-18 (×6): 3 mL via INTRAVENOUS

## 2020-06-15 MED ORDER — FUROSEMIDE 10 MG/ML IJ SOLN
40.0000 mg | Freq: Two times a day (BID) | INTRAMUSCULAR | Status: DC
  Administered 2020-06-16: 40 mg via INTRAVENOUS
  Filled 2020-06-15: qty 4

## 2020-06-15 MED ORDER — ENALAPRIL MALEATE 10 MG PO TABS
10.0000 mg | ORAL_TABLET | Freq: Every day | ORAL | Status: DC
  Administered 2020-06-16: 10 mg via ORAL
  Filled 2020-06-15: qty 1

## 2020-06-15 MED ORDER — PNEUMOCOCCAL VAC POLYVALENT 25 MCG/0.5ML IJ INJ
0.5000 mL | INJECTION | INTRAMUSCULAR | Status: AC
  Administered 2020-06-16: 0.5 mL via INTRAMUSCULAR
  Filled 2020-06-15: qty 0.5

## 2020-06-15 MED ORDER — ACETAMINOPHEN 325 MG PO TABS
325.0000 mg | ORAL_TABLET | ORAL | Status: DC | PRN
  Administered 2020-06-15 (×2): 325 mg via ORAL
  Filled 2020-06-15 (×2): qty 1

## 2020-06-15 MED ORDER — MIRTAZAPINE 15 MG PO TABS
7.5000 mg | ORAL_TABLET | Freq: Every day | ORAL | Status: DC
  Administered 2020-06-15 – 2020-06-17 (×3): 7.5 mg via ORAL
  Filled 2020-06-15 (×3): qty 1

## 2020-06-15 MED ORDER — FUROSEMIDE 10 MG/ML IJ SOLN: 40.0000 mg | Freq: Once | INTRAMUSCULAR | Status: AC

## 2020-06-15 MED ORDER — ENOXAPARIN SODIUM 40 MG/0.4ML ~~LOC~~ SOLN
40.0000 mg | SUBCUTANEOUS | Status: DC
  Administered 2020-06-15 – 2020-06-18 (×4): 40 mg via SUBCUTANEOUS
  Filled 2020-06-15 (×4): qty 0.4

## 2020-06-15 MED ORDER — METOPROLOL TARTRATE 25 MG PO TABS
25.0000 mg | ORAL_TABLET | Freq: Two times a day (BID) | ORAL | Status: DC
  Administered 2020-06-15 – 2020-06-18 (×7): 25 mg via ORAL
  Filled 2020-06-15 (×7): qty 1

## 2020-06-15 MED ORDER — FUROSEMIDE 10 MG/ML IJ SOLN
INTRAMUSCULAR | Status: AC
  Administered 2020-06-15: 40 mg via INTRAVENOUS
  Filled 2020-06-15: qty 4

## 2020-06-15 MED ORDER — KETOROLAC TROMETHAMINE 15 MG/ML IJ SOLN
15.0000 mg | Freq: Four times a day (QID) | INTRAMUSCULAR | Status: AC | PRN
  Administered 2020-06-15 – 2020-06-16 (×2): 15 mg via INTRAVENOUS
  Filled 2020-06-15 (×3): qty 1

## 2020-06-15 MED ORDER — NAPROXEN 375 MG PO TABS
375.0000 mg | ORAL_TABLET | Freq: Two times a day (BID) | ORAL | Status: DC
  Filled 2020-06-15: qty 1

## 2020-06-15 MED ORDER — SODIUM CHLORIDE 0.9 % IV SOLN
1.0000 g | Freq: Once | INTRAVENOUS | Status: AC
  Administered 2020-06-15: 1 g via INTRAVENOUS
  Filled 2020-06-15: qty 10

## 2020-06-15 MED ORDER — SODIUM CHLORIDE 0.9% FLUSH: 3.0000 mL | INTRAVENOUS | Status: DC | PRN

## 2020-06-15 MED ORDER — ONDANSETRON HCL 4 MG/2ML IJ SOLN: 4.0000 mg | Freq: Four times a day (QID) | INTRAMUSCULAR | Status: DC | PRN

## 2020-06-15 MED ORDER — AMLODIPINE BESYLATE 10 MG PO TABS
10.0000 mg | ORAL_TABLET | Freq: Every day | ORAL | Status: DC
  Administered 2020-06-15 – 2020-06-18 (×4): 10 mg via ORAL
  Filled 2020-06-15 (×3): qty 1
  Filled 2020-06-15: qty 2

## 2020-06-15 MED ORDER — MELATONIN 5 MG PO TABS
5.0000 mg | ORAL_TABLET | Freq: Every day | ORAL | Status: DC
  Administered 2020-06-15 – 2020-06-17 (×3): 5 mg via ORAL
  Filled 2020-06-15 (×3): qty 1

## 2020-06-15 NOTE — ED Provider Notes (Signed)
[facility] Emergency Department Provider Note  Time seen: 9:29 AM  I have reviewed the triage vital signs and the nursing notes.   HISTORY  Chief Complaint Shortness of Breath, Nausea, and Weakness   HPI Virginia Clements is a 87 y.o. female with a past medical history of hypertension, presents to the emergency department for generalized weakness and shortness of breath.   According to EMS and the patient over the past 2 days or so she has felt progressively more weak but cannot get out of bed today due to weakness.  Patient satting around 90% on room air with no baseline O2 requirement.  Patient has been experiencing some nausea and has been vomiting since last night.  Denies any diarrhea.  Denies any known fever.  Does state a slight cough today as well.  No dysuria but the patient states she was diagnosed with UTI several weeks ago and is currently taking antibiotics for this.  Past Medical History:  Diagnosis Date  . Breast cancer ([place])   . Hypertension   . Hypoglycemia   . Radiation 2009    Patient Active Problem List   Diagnosis Date Noted  . Hypokalemia 05/31/2018    Past Surgical History:  Procedure Laterality Date  . BREAST BIOPSY Right 2009    Prior to Admission medications   Medication Sig Start Date End Date Taking? Authorizing Provider  acetaminophen (TYLENOL) 325 MG tablet Take 2 tablets (650 mg total) by mouth every 6 (six) hours as needed for mild pain (or Fever >/= 101). 06/04/18   [name], MD  amLODipine (NORVASC) 10 MG tablet Take 1 tablet (10 mg total) by mouth daily. 06/05/18   [name], MD  aspirin EC 325 MG tablet Take 325 mg by mouth daily.    [provider]  Calcium Carbonate (CALCIUM 600 PO) Take 600 mg by mouth daily.    [provider]  Cholecalciferol (VITAMIN D3) 25 MCG (1000 UT) CAPS Take 1,000 Units by mouth daily.    [provider]  enalapril (VASOTEC) 10 MG  tablet Take 10 mg by mouth daily.    [provider]  metoprolol tartrate (LOPRESSOR) 25 MG tablet Take 1 tablet (25 mg total) by mouth 2 (two) times daily. 06/04/18   [name], MD  Multiple Vitamin (MULTIVITAMIN) tablet Take 1 tablet by mouth daily.    [provider]  naproxen (NAPROSYN) 375 MG tablet Take 375 mg by mouth 2 (two) times daily with a meal.    [provider]  timolol (TIMOPTIC-XR) 0.5 % ophthalmic gel-forming Place 1 drop into both eyes at bedtime. 07/06/14   [provider]    Allergies  Allergen Reactions  . Alendronate Other (See Comments)    Nervousness and shakiness per pt  . Codeine     dizziness    Family History  Problem Relation Age of Onset  . Hypertension Mother     Social History Social History   Tobacco Use  . Smoking status: Former Research scientist (life sciences)  . Smokeless tobacco: Never Used  Vaping Use  . Vaping Use: Never used  Substance Use Topics  . Alcohol use: Not Currently  . Drug use: Never    Review of Systems Constitutional: Negative for fever. Cardiovascular: Negative for chest pain. Respiratory: Mild shortness of breath.  Mild cough. Gastrointestinal: Abdominal pain.  Positive for nausea and vomiting.  Negative for diarrhea. Genitourinary: Negative for urinary compaints Musculoskeletal: Negative for musculoskeletal complaints Neurological: Negative for headache All  other ROS negative  ____________________________________________   PHYSICAL EXAM:  VITAL SIGNS: ED Triage Vitals  Enc Vitals Group     BP 06/15/20 0858 123/78     Pulse Rate 06/15/20 0856 (!) 112     Resp 06/15/20 0856 (!) 31     Temp 06/15/20 0900 98.2 F (36.8 C)     Temp Source 06/15/20 0900 Oral     SpO2 06/15/20 0856 96 %     Weight 06/15/20 0856 189 lb (85.7 kg)     Height 06/15/20 0856 5\' 2"  (1.575 m)     Head Circumference --      Peak Flow --      Pain Score 06/15/20 0856 0     Pain Loc --      Pain Edu? --       Excl. in [place]? --    Constitutional: Alert and oriented.  No acute distress. Eyes: Normal exam ENT      Head: Normocephalic and atraumatic.      Mouth/Throat: Mucous membranes are moist. Cardiovascular: Normal rate, regular rhythm. No murmurs, rubs, or gallops. Respiratory: Normal respiratory effort without tachypnea nor retractions. Breath sounds are clear, without wheeze rales or rhonchi. Gastrointestinal: Soft and nontender. No distention.  Musculoskeletal: Nontender with normal range of motion in all extremities.  Neurologic:  Normal speech and language. No gross focal neurologic deficits  Skin:  Skin is warm, dry and intact.  Psychiatric: Mood and affect are normal.   ____________________________________________    EKG  EKG viewed and interpreted by myself shows sinus tachycardia 114 bpm with a narrow QRS, left axis deviation, largely normal intervals with nonspecific ST changes.  ____________________________________________    RADIOLOGY  Chest x-ray is clear  ____________________________________________   INITIAL IMPRESSION / ASSESSMENT AND PLAN / ED COURSE  Pertinent labs & imaging results that were available during my care of the patient were reviewed by me and considered in my medical decision making (see chart for details).   Patient presents to the emergency department for generalized weakness and shortness of breath as well as nausea vomiting.  Overall patient appears well she is alert and oriented able to give a great history.  Patient has 90% on room air with no baseline O2 requirement.  States recently diagnosed with UTI.  We will check labs, urinalysis, chest x-ray and a Covid swab.  We will continue to closely monitor while awaiting results.  Patient agreeable to plan of care.  Patient's work-up shows significant urinary tract infection with white blood cell clumps highly suspect this to be the cause of base of generalized weakness.  We will send cultures and  start the patient on IV antibiotics.  We will admit to the hospital service.  Virginia Clements was evaluated in Emergency Department on 06/15/2020 for the symptoms described in the history of present illness. She was evaluated in the context of the global COVID-19 pandemic, which necessitated consideration that the patient might be at risk for infection with the SARS-CoV-2 virus that causes COVID-19. Institutional protocols and algorithms that pertain to the evaluation of patients at risk for COVID-19 are in a state of rapid change based on information released by regulatory bodies including the CDC and federal and state organizations. These policies and algorithms were followed during the patient's care in the ED.  ____________________________________________   FINAL CLINICAL IMPRESSION(S) / ED DIAGNOSES  Urinary tract infection Weakness Nausea vomiting   Harvest Dark, MD 06/15/20 1142

## 2020-06-15 NOTE — ED Notes (Signed)
Patient transported to CT 

## 2020-06-15 NOTE — ED Notes (Addendum)
Pt's brief was saturated with urine. Full linen change at this time. Urine was foul-smelling and dark. Purewick placed on pt at this time.

## 2020-06-15 NOTE — ED Notes (Signed)
RT at bedside to assess patient, patient currently on room air.

## 2020-06-15 NOTE — ED Notes (Signed)
Patient returned from CT scan. RT aware of Bipap order.

## 2020-06-15 NOTE — Plan of Care (Signed)

## 2020-06-15 NOTE — H&P (Addendum)
History and Physical   JENIAH KISHI BDZ:[id number] DOB: 11/17/1929 DOA: 06/15/2020  PCP: Baxter Hire, MD  Patient coming from: home via EMS  I have personally briefly reviewed patient's old medical records in [place].  Chief Concern: weakness  HPI: Virginia Clements is a 87 y.o. female with medical history significant for hypertension, chronic weakness, mostly bedbound state, ambulates with a walker to the restroom/short distance, is wheelchair-bound, truncal obesity, hyperlipidemia, history of osteoporosis, former tobacco user, presents to the emergency department for chief concerns of weakness.  Per patient, she fell getting out of bed a.m. of presentation.  She was attempting to go to the restroom and she slipped and fell and hit her head and neck.  She denies loss of consciousness.  She does have musculoskeletal right-sided neck and head pain.  She denies fever, cough, nausea, vomiting, chest pain, abdominal pain, diarrhea, blood in her stool at this time.  She endorses dysuria for 2 weeks and denies hematuria.  She does endorse nausea for 2 days.  Per family, her sister and brother-in-law endorses that patient has had poor PO intake for two days.  Social history: patient is widowed and does not have children. lives with sister and brother in law and has a nursing care giver who comes 6 days per week. Quit tobacco smoking about 10-12 years, 1-2 ppd. She does not drink. Formerly worked for [employer].   At baseline, patient walks with a walker or wheelchair. Mostly bedbound.   ROS: Constitutional: no weight change, no fever ENT/Mouth: no sore throat, no rhinorrhea Eyes: no eye pain, no vision changes Cardiovascular: no chest pain, no dyspnea,  no edema, no palpitations Respiratory: no cough, no sputum, no wheezing Gastrointestinal: + nausea, no vomiting, no diarrhea, no constipation Genitourinary: no urinary incontinence, + dysuria, no  hematuria Musculoskeletal: no arthralgias, no myalgias Skin: no skin lesions, no pruritus, Neuro: + weakness, no loss of consciousness, no syncope Psych: no anxiety, no depression, + decrease appetite Heme/Lymph: no bruising, no bleeding  ED Course: Discussed with ED provider, patient requiring hospitalization for shortness of breath suspect secondary to urinary tract infection. Vitals in the ED revealed temperature of 98.2, respiration rate of 22, heart rate of 73, blood pressure 144/79, satting at 98% on 4 L nasal cannula.  Labs in the emergency department was remarkable for serum creatinine of 0.82, chloride 102, nonfasting blood glucose of 184, troponins was initially 8 and increased to 9.  UA was positive for leukocytes trace, and nitrites.  WBC was elevated at 13.2, hemoglobin 13, platelets 227.  ED provider gave 1 dose of ceftriaxone.  Assessment/Plan  Active Problems:   UTI (urinary tract infection)   # Weakness secondary to a fall, multifactorial including recent urinary tract infection failed outpatient therapy and heart failure exacerbation -CT of the head without contrast ordered, CT cervical spine without contrast ordered -Status post ceftriaxone 1 dose in the emergency department for ED provider -UA was positive for leukocyte esterase and nitrates positive -We will continue ceftriaxone 1 g at this time -PT, OT  # Acute hypoxic respiratory failure # Shortness of breath with bilateral lower extremity edema-query heart failure exacerbation -Query hypo-alveolar ventilation syndrome -Complete echo -Lasix 40 mg IV once -Consider BiPAP as patient desatted to 88 to 91% on room air and was placed on 4 L nasal cannula and chest x-ray reviewed by me showed bilateral vascular pulmonary congestion -However given Lasix 40 mg IV patient urinated 600 mL per nursing staff  and oxygenation improved significantly -No indication for BiPAP at this time -Lasix 40 mg IV daily scheduled for  06/16/2020 -Checking BNP, TSH -Admit to progressive cardiac, with telemetry  Hypertension-amlodipine 10 mg daily and metoprolol tartrate 25 mg twice daily resumed -Enalapril 10 mg daily resumed for 06/16/2020  Monoclonal gammopathy -outpatient follow-up  Obesity-outpatient  Remeron 7.5 mg nightly  Chart reviewed.   Outpatient PCP: Patient received Keflex 500 mg twice daily, and completed therapy (05/14/2020 to 05/21/2020)  DVT prophylaxis: Enoxaparin Code Status: DNR Diet: Heart healthy Family Communication: updated sister and daughter Disposition Plan: Pending clinical course Consults called: No Admission status: Progressive cardiac, with telemetry, observation  Past Medical History:  Diagnosis Date  . Breast cancer ([place])   . Hypertension   . Hypoglycemia   . Radiation 2009   Past Surgical History:  Procedure Laterality Date  . BREAST BIOPSY Right 2009   Social History:  reports that she has quit smoking. She has never used smokeless tobacco. She reports previous alcohol use. She reports that she does not use drugs.  Allergies  Allergen Reactions  . Alendronate Other (See Comments)    Nervousness and shakiness per pt  . Codeine     dizziness   Family History  Problem Relation Age of Onset  . Hypertension Mother    Family history: Family history reviewed and not pertinent  Prior to Admission medications   Medication Sig Start Date End Date Taking? Authorizing Provider  acetaminophen (TYLENOL) 325 MG tablet Take 2 tablets (650 mg total) by mouth every 6 (six) hours as needed for mild pain (or Fever >/= 101). 06/04/18  Yes [name], MD  amLODipine (NORVASC) 10 MG tablet Take 1 tablet (10 mg total) by mouth daily. 06/05/18  Yes [name], MD  enalapril (VASOTEC) 10 MG tablet Take 10 mg by mouth daily.   Yes [provider]  metoprolol tartrate (LOPRESSOR) 25 MG tablet Take 1 tablet (25 mg total) by mouth 2 (two) times daily. 06/04/18  Yes  [name], MD  mirtazapine (REMERON) 7.5 MG tablet Take 7.5 mg by mouth at bedtime.   Yes [provider]  naproxen (NAPROSYN) 375 MG tablet Take 375 mg by mouth 2 (two) times daily with a meal.   Yes [provider]  timolol (TIMOPTIC-XR) 0.5 % ophthalmic gel-forming Place 1 drop into both eyes at bedtime. 07/06/14  Yes [provider]   Physical Exam: Vitals:   06/15/20 1000 06/15/20 1030 06/15/20 1100 06/15/20 1200  BP: (!) 115/93 124/81 133/71 138/72  Pulse: 93 81 79 80  Resp: (!) 23 (!) 25 17 (!) 23  Temp:      TempSrc:      SpO2: 100% 92% 100% 97%  Weight:      Height:       Constitutional: appears age-appropriate, NAD, calm, comfortable Eyes: PERRL, lids and conjunctivae normal ENMT: Mucous membranes are moist. Posterior pharynx clear of any exudate or lesions. Age-appropriate dentition. Hearing appropriate Neck: normal, supple, no masses, no thyromegaly Respiratory: clear to auscultation bilaterally, no wheezing, no crackles. Normal respiratory effort. No accessory muscle use.  Cardiovascular: Regular rate and rhythm, no murmurs / rubs / gallops. No extremity edema. 2+ pedal pulses. No carotid bruits.  Abdomen: no tenderness, no masses palpated, no hepatosplenomegaly. Bowel sounds positive.  Musculoskeletal: no clubbing / cyanosis. No joint deformity upper and lower extremities. Good ROM, no contractures, no atrophy. Normal muscle tone.  Skin: no rashes, lesions, ulcers. No induration Neurologic: Sensation intact. Strength  5/5 in all 4.  Psychiatric: Normal judgment and insight. Alert and oriented x 3. Normal mood.   EKG: independently reviewed, showing sinus tachycardia with rate of 114, QTc 444  Chest x-ray on Admission: I personally reviewed and I agree with radiologist reading as below.  DG Chest Portable 1 View  Result Date: 06/15/2020 CLINICAL DATA:  Shortness of breath. EXAM: PORTABLE CHEST 1 VIEW COMPARISON:  Aug 10, 2011.  FINDINGS: Stable cardiomediastinal silhouette. No pneumothorax or pleural effusion is noted. Stable bilateral opacities are noted which may represent scarring. No definite acute abnormality is noted. Bony thorax is unremarkable. IMPRESSION: No acute cardiopulmonary abnormality seen. Aortic Atherosclerosis (ICD10-I70.0). Electronically Signed   By: Marijo Conception M.D.   On: 06/15/2020 09:22   Labs on Admission: I have personally reviewed following labs  CBC: Recent Labs  Lab 06/15/20 0903  WBC 13.6*  HGB 13.0  HCT 39.8  MCV 87.1  PLT 824   Basic Metabolic Panel: Recent Labs  Lab 06/15/20 0903  NA 137  K 3.7  CL 102  CO2 24  GLUCOSE 184*  BUN 25*  CREATININE 0.82  CALCIUM 9.9   GFR: Estimated Creatinine Clearance: 48.1 mL/min (by C-G formula based on SCr of 0.82 mg/dL).  Liver Function Tests: Recent Labs  Lab 06/15/20 0903  AST 23  ALT 17  ALKPHOS 75  BILITOT 1.2  PROT 6.9  ALBUMIN 3.6   Recent Labs  Lab 06/15/20 0903  LIPASE 28   Urine analysis:    Component Value Date/Time   COLORURINE ORANGE (A) 06/15/2020 0903   APPEARANCEUR TURBID (A) 06/15/2020 0903   APPEARANCEUR Clear 08/10/2011 0226   LABSPEC 1.015 06/15/2020 0903   LABSPEC 1.025 08/10/2011 0226   PHURINE 5.0 06/15/2020 0903   GLUCOSEU NEGATIVE 06/15/2020 0903   GLUCOSEU Negative 08/10/2011 0226   HGBUR MODERATE (A) 06/15/2020 0903   BILIRUBINUR NEGATIVE 06/15/2020 0903   BILIRUBINUR Negative 08/10/2011 0226   KETONESUR NEGATIVE 06/15/2020 0903   PROTEINUR 100 (A) 06/15/2020 0903   NITRITE POSITIVE (A) 06/15/2020 0903   LEUKOCYTESUR TRACE (A) 06/15/2020 0903   LEUKOCYTESUR Negative 08/10/2011 0226   Kaydon Husby N Nysia Dell D.O. [facility]  If 7PM-7AM, please contact overnight-coverage provider If 7AM-7PM, please contact day coverage provider [email or web address]  06/15/2020, 12:31 PM

## 2020-06-15 NOTE — ED Notes (Signed)
Patient reports to Dr. Tobie Poet that she fell this morning while getting out of bed. Denies LOC. CT scan of head to be ordered.

## 2020-06-15 NOTE — Progress Notes (Signed)
OT Cancellation Note  Patient Details Name: Virginia Clements MRN: [id number] DOB: 1930-01-30   Cancelled Treatment:    Reason Eval/Treat Not Completed: Patient at procedure or test/ unavailable. Consult received. Pt noted off the floor. Will re-attempt OT evaluation next date as medically appropriate.   Hanley Hays, MPH, MS, OTR/L ascom [phone] 06/15/20, 4:12 PM

## 2020-06-15 NOTE — ED Notes (Signed)
Dr. Cox at bedside.  

## 2020-06-15 NOTE — ED Triage Notes (Signed)
Pt via EMS from home. Pt states she felt more weak than normal states she couldn't get out of bed. On EMS arrival, pt O2 saturation was 90% on RA, EMS placed on 6L [place]. On arrival, O2 is 91% on RA. Pt placed on 4L [place]. O2 increased to 95%. Pt also c/o nausea yesterday more and this morning. On arrival, pt vomited bile-like substance. Pt is A&Ox4 and NAD.

## 2020-06-16 ENCOUNTER — Observation Stay (HOSPITAL_COMMUNITY)
Admit: 2020-06-16 | Discharge: 2020-06-16 | Disposition: A | Discharge status: Home / Self Care | Payer: Medicare Other | Attending: Internal Medicine | Admitting: Internal Medicine

## 2020-06-16 DIAGNOSIS — W06XXXA Fall from bed, initial encounter: Secondary | ICD-10-CM | POA: Diagnosis present

## 2020-06-16 DIAGNOSIS — I119 Hypertensive heart disease without heart failure: Secondary | ICD-10-CM | POA: Diagnosis present

## 2020-06-16 DIAGNOSIS — N390 Urinary tract infection, site not specified: Secondary | ICD-10-CM | POA: Diagnosis not present

## 2020-06-16 DIAGNOSIS — R531 Weakness: Secondary | ICD-10-CM

## 2020-06-16 DIAGNOSIS — R0600 Dyspnea, unspecified: Secondary | ICD-10-CM

## 2020-06-16 DIAGNOSIS — Z993 Dependence on wheelchair: Secondary | ICD-10-CM

## 2020-06-16 DIAGNOSIS — A419 Sepsis, unspecified organism: Principal | ICD-10-CM

## 2020-06-16 DIAGNOSIS — R112 Nausea with vomiting, unspecified: Secondary | ICD-10-CM

## 2020-06-16 DIAGNOSIS — Z923 Personal history of irradiation: Secondary | ICD-10-CM | POA: Diagnosis not present

## 2020-06-16 DIAGNOSIS — Z888 Allergy status to other drugs, medicaments and biological substances status: Secondary | ICD-10-CM | POA: Diagnosis not present

## 2020-06-16 DIAGNOSIS — N179 Acute kidney failure, unspecified: Secondary | ICD-10-CM | POA: Diagnosis not present

## 2020-06-16 DIAGNOSIS — Z853 Personal history of malignant neoplasm of breast: Secondary | ICD-10-CM | POA: Diagnosis not present

## 2020-06-16 DIAGNOSIS — D472 Monoclonal gammopathy: Secondary | ICD-10-CM | POA: Diagnosis present

## 2020-06-16 DIAGNOSIS — Z20822 Contact with and (suspected) exposure to covid-19: Secondary | ICD-10-CM | POA: Diagnosis present

## 2020-06-16 DIAGNOSIS — M810 Age-related osteoporosis without current pathological fracture: Secondary | ICD-10-CM | POA: Diagnosis present

## 2020-06-16 DIAGNOSIS — E860 Dehydration: Secondary | ICD-10-CM | POA: Diagnosis present

## 2020-06-16 DIAGNOSIS — E785 Hyperlipidemia, unspecified: Secondary | ICD-10-CM | POA: Diagnosis present

## 2020-06-16 DIAGNOSIS — Z87891 Personal history of nicotine dependence: Secondary | ICD-10-CM | POA: Diagnosis not present

## 2020-06-16 DIAGNOSIS — B9620 Unspecified Escherichia coli [E. coli] as the cause of diseases classified elsewhere: Secondary | ICD-10-CM | POA: Diagnosis present

## 2020-06-16 DIAGNOSIS — Z7982 Long term (current) use of aspirin: Secondary | ICD-10-CM | POA: Diagnosis not present

## 2020-06-16 DIAGNOSIS — Z79899 Other long term (current) drug therapy: Secondary | ICD-10-CM | POA: Diagnosis not present

## 2020-06-16 DIAGNOSIS — E669 Obesity, unspecified: Secondary | ICD-10-CM | POA: Diagnosis present

## 2020-06-16 DIAGNOSIS — N3001 Acute cystitis with hematuria: Secondary | ICD-10-CM | POA: Diagnosis not present

## 2020-06-16 DIAGNOSIS — J9601 Acute respiratory failure with hypoxia: Secondary | ICD-10-CM | POA: Diagnosis present

## 2020-06-16 DIAGNOSIS — Z23 Encounter for immunization: Secondary | ICD-10-CM | POA: Diagnosis not present

## 2020-06-16 DIAGNOSIS — Z885 Allergy status to narcotic agent status: Secondary | ICD-10-CM | POA: Diagnosis not present

## 2020-06-16 DIAGNOSIS — Z7401 Bed confinement status: Secondary | ICD-10-CM | POA: Diagnosis not present

## 2020-06-16 DIAGNOSIS — E8770 Fluid overload, unspecified: Secondary | ICD-10-CM | POA: Diagnosis present

## 2020-06-16 DIAGNOSIS — I10 Essential (primary) hypertension: Secondary | ICD-10-CM | POA: Diagnosis not present

## 2020-06-16 DIAGNOSIS — Z66 Do not resuscitate: Secondary | ICD-10-CM | POA: Diagnosis present

## 2020-06-16 DIAGNOSIS — Z8249 Family history of ischemic heart disease and other diseases of the circulatory system: Secondary | ICD-10-CM | POA: Diagnosis not present

## 2020-06-16 LAB — ECHOCARDIOGRAM COMPLETE
Height: 62 in
S' Lateral: 2.37 cm
Weight: 2895.96 oz

## 2020-06-16 LAB — CBC
HCT: 38.1 % (ref 36.0–46.0)
Hemoglobin: 12.3 g/dL (ref 12.0–15.0)
MCH: 28.3 pg (ref 26.0–34.0)
MCHC: 32.3 g/dL (ref 30.0–36.0)
MCV: 87.6 fL (ref 80.0–100.0)
Platelets: 202 10*3/uL (ref 150–400)
RBC: 4.35 MIL/uL (ref 3.87–5.11)
RDW: 13.6 % (ref 11.5–15.5)
WBC: 11.1 10*3/uL — ABNORMAL HIGH (ref 4.0–10.5)
nRBC: 0 % (ref 0.0–0.2)

## 2020-06-16 LAB — BASIC METABOLIC PANEL
Anion gap: 10 (ref 5–15)
BUN: 31 mg/dL — ABNORMAL HIGH (ref 8–23)
CO2: 29 mmol/L (ref 22–32)
Calcium: 10.4 mg/dL — ABNORMAL HIGH (ref 8.9–10.3)
Chloride: 98 mmol/L (ref 98–111)
Creatinine, Ser: 1.11 mg/dL — ABNORMAL HIGH (ref 0.44–1.00)
GFR, Estimated: 48 mL/min — ABNORMAL LOW (ref >60)
Glucose, Bld: 118 mg/dL — ABNORMAL HIGH (ref 70–99)
Potassium: 3.3 mmol/L — ABNORMAL LOW (ref 3.5–5.1)
Sodium: 137 mmol/L (ref 135–145)

## 2020-06-16 MED ORDER — SODIUM CHLORIDE 0.9 % IV SOLN
1.0000 g | INTRAVENOUS | Status: DC
  Administered 2020-06-16: 1 g via INTRAVENOUS
  Filled 2020-06-16 (×2): qty 10

## 2020-06-16 MED ORDER — POTASSIUM CHLORIDE IN NACL 20-0.9 MEQ/L-% IV SOLN
INTRAVENOUS | Status: DC
  Filled 2020-06-16 (×4): qty 1000

## 2020-06-16 MED ORDER — ACETAMINOPHEN 325 MG PO TABS
650.0000 mg | ORAL_TABLET | Freq: Four times a day (QID) | ORAL | Status: DC | PRN
  Administered 2020-06-16 – 2020-06-18 (×3): 650 mg via ORAL
  Filled 2020-06-16 (×3): qty 2

## 2020-06-16 MED ORDER — POTASSIUM CHLORIDE CRYS ER 20 MEQ PO TBCR
40.0000 meq | EXTENDED_RELEASE_TABLET | ORAL | Status: AC
  Administered 2020-06-16 (×2): 40 meq via ORAL
  Filled 2020-06-16 (×2): qty 2

## 2020-06-16 NOTE — Plan of Care (Signed)
  Problem: Education: Goal: Knowledge of General Education information will improve Description: Including pain rating scale, medication(s)/side effects and non-pharmacologic comfort measures 06/16/2020 1802 by Cristela Blue, RN Outcome: Progressing 06/16/2020 1801 by Cristela Blue, RN Outcome: Progressing   Problem: Health Behavior/Discharge Planning: Goal: Ability to manage health-related needs will improve 06/16/2020 1802 by Cristela Blue, RN Outcome: Progressing 06/16/2020 1801 by Cristela Blue, RN Outcome: Progressing   Problem: Clinical Measurements: Goal: Ability to maintain clinical measurements within normal limits will improve 06/16/2020 1802 by Cristela Blue, RN Outcome: Progressing 06/16/2020 1801 by Cristela Blue, RN Outcome: Progressing Goal: Will remain free from infection 06/16/2020 1802 by Cristela Blue, RN Outcome: Progressing 06/16/2020 1801 by Cristela Blue, RN Outcome: Progressing Goal: Diagnostic test results will improve 06/16/2020 1802 by Cristela Blue, RN Outcome: Progressing 06/16/2020 1801 by Cristela Blue, RN Outcome: Progressing Goal: Respiratory complications will improve 06/16/2020 1802 by Cristela Blue, RN Outcome: Progressing 06/16/2020 1801 by Cristela Blue, RN Outcome: Progressing Goal: Cardiovascular complication will be avoided 06/16/2020 1802 by Cristela Blue, RN Outcome: Progressing 06/16/2020 1801 by Cristela Blue, RN Outcome: Progressing   Problem: Activity: Goal: Risk for activity intolerance will decrease 06/16/2020 1802 by Cristela Blue, RN Outcome: Progressing 06/16/2020 1801 by Cristela Blue, RN Outcome: Progressing   Problem: Nutrition: Goal: Adequate nutrition will be maintained 06/16/2020 1802 by Cristela Blue, RN Outcome: Progressing 06/16/2020 1801 by Cristela Blue, RN Outcome: Progressing   Problem: Coping: Goal: Level of anxiety will decrease 06/16/2020 1802 by Cristela Blue, RN Outcome:  Progressing 06/16/2020 1801 by Cristela Blue, RN Outcome: Progressing   Problem: Elimination: Goal: Will not experience complications related to bowel motility 06/16/2020 1802 by Cristela Blue, RN Outcome: Progressing 06/16/2020 1801 by Cristela Blue, RN Outcome: Progressing Goal: Will not experience complications related to urinary retention 06/16/2020 1802 by Cristela Blue, RN Outcome: Progressing 06/16/2020 1801 by Cristela Blue, RN Outcome: Progressing   Problem: Pain Managment: Goal: General experience of comfort will improve 06/16/2020 1802 by Cristela Blue, RN Outcome: Progressing 06/16/2020 1801 by Cristela Blue, RN Outcome: Progressing   Problem: Safety: Goal: Ability to remain free from injury will improve 06/16/2020 1802 by Cristela Blue, RN Outcome: Progressing 06/16/2020 1801 by Cristela Blue, RN Outcome: Progressing   Problem: Skin Integrity: Goal: Risk for impaired skin integrity will decrease 06/16/2020 1802 by Cristela Blue, RN Outcome: Progressing 06/16/2020 1801 by Cristela Blue, RN Outcome: Progressing

## 2020-06-16 NOTE — Progress Notes (Signed)
*  PRELIMINARY RESULTS* Echocardiogram 2D Echocardiogram has been performed.  Sherrie Sport 06/16/2020, 8:51 AM

## 2020-06-16 NOTE — Evaluation (Signed)
Physical Therapy Evaluation Patient Details Name: FREDDI FORSTER MRN: [id number] DOB: 04/10/1929 Today's Date: 06/16/2020   History of Present Illness  Pt is an 87 y/o F With PMH: HTN, BRCA, glaucoma, HLD and osteoporosis. Pt's family brought to ED d/t poor PO intake and fall in AM of admission. Pt with reports of dysuria and found to have UTI.  Clinical Impression  Patient received in supine in bed. She has flat affect. Patient is quite weak and lying with head rotated to right. She has pain with R Knee flexion or movement in bed. Patient requires total assist +1-2 for bed mobility at this time. Patient will continue to benefit from skilled PT while here to improve strength and functional independence to reduce caregiver burden.      Follow Up Recommendations SNF    Equipment Recommendations  None recommended by PT    Recommendations for Other Services       Precautions / Restrictions Precautions Precautions: Fall Restrictions Weight Bearing Restrictions: No      Mobility  Bed Mobility Overal bed mobility: Needs Assistance Bed Mobility: Rolling Rolling: Max assist   Supine to sit: Max assist;+2 for physical assistance;+2 for safety/equipment Sit to supine: Total assist;+2 for physical assistance;+2 for safety/equipment   General bed mobility comments: assist for trunk and limbs, cues to sequence. Limited ability to contribute. per OT notes    Transfers Overall transfer level: Needs assistance               General transfer comment: PT did not attempt, OT reports requires +2 max/total assist for attempted transfer.  Ambulation/Gait                Stairs            Wheelchair Mobility    Modified Rankin (Stroke Patients Only)       Balance Overall balance assessment: Needs assistance   Sitting balance-Leahy Scale: Poor Sitting balance - Comments: L lateral lean in static sitting and requires at lease MIN A for support throughout      Standing balance-Leahy Scale: Zero Standing balance comment: Per OT-attempt to engage pt in stand both with one person assisting and with 2 to no avail as pt only minimally able to clear bottom from bed at this time. With b/l feet and knees being blocked by therapist and mobility specialist, pt's feet somehow still slide forward and pt leans back almost purposefully as if hoping to get momentum to come to stand in this manor.                             Pertinent Vitals/Pain Pain Assessment: Faces Pain Score: 8  Faces Pain Scale: Hurts little more Pain Location: R knee with flexion, bed mobility Pain Descriptors / Indicators: Sore;Guarding;Grimacing Pain Intervention(s): Monitored during session;Repositioned    Home Living Family/patient expects to be discharged to:: Private residence Living Arrangements: Other relatives Available Help at Discharge: Family;Available 24 hours/day;Personal care attendant;Available PRN/intermittently Type of Home: House     Entrance Stairs-Number of Steps: pt states ramp, but ?? historian Home Layout: One level Home Equipment: Walker - 2 wheels;Wheelchair - manual Additional Comments: Pt's BIL reports 3 falls in last 2-3 months in which knees buckled.    Prior Function Level of Independence: Needs assistance   Gait / Transfers Assistance Needed: Pt requires w/c primarily for fxl mobility at baseline. She reports she requires some light assist for getting OOB. States  she was able to walk short distances with RW with sister's help to the restroom. Pt's BIL confirms this via phone and states he has only declined in the past 2-3 months.  ADL's / Homemaking Assistance Needed: Pt was requiring assist from PCA in the evening to perform reverse self care to get ready for bed. Her sister was helping her in AM. Pt was able to contribute to UB ADLs, but requires MAX A For LB At baseline including bathing and dressing. In addition, pt's sister and  brother-in-law perform all HH IADLs.        Hand Dominance   Dominant Hand: Right    Extremity/Trunk Assessment   Upper Extremity Assessment Upper Extremity Assessment: Generalized weakness    Lower Extremity Assessment Lower Extremity Assessment: Generalized weakness    Cervical / Trunk Assessment Cervical / Trunk Assessment: Kyphotic;Other exceptions (patient in bed with head flexed to her right. When asked to turn head the other direction she is unable to get past midline at best.)  Communication   Communication: No difficulties  Cognition Arousal/Alertness: Awake/alert Behavior During Therapy: Flat affect Overall Cognitive Status: No family/caregiver present to determine baseline cognitive functioning                                 General Comments: If asking yes/no questions, patient replied "yes" to most questions. When asking open ended questions she is unable to give me answers. Stating "I don't know" to what her PCA assists her with.      General Comments      Exercises Other Exercises Other Exercises: OT engaes pt in seated UB bathing and dressing with MIN/MOD A d/t limited shld ROM and UE strength as well as limited static sitting balance. OT educates pt re: importance of OOB Activity, improving core control and strength for simple seated tasks, importance of getting OOB to maintain strength/prevent atrophy. Pt with MIN/MOD understanding, but poor carryover. OT also completed this education with pt's brother-in-law via telephone and engage him in d/c planning with education on therapy intensity in home health versus skilled nursing.   Assessment/Plan    PT Assessment Patient needs continued PT services  PT Problem List Decreased activity tolerance;Decreased balance;Decreased mobility;Obesity;Decreased strength;Pain       PT Treatment Interventions DME instruction;Functional mobility training;Therapeutic activities;Patient/family  education;Therapeutic exercise;Gait training;Balance training    PT Goals (Current goals can be found in the Care Plan section)  Acute Rehab PT Goals Patient Stated Goal: none stated PT Goal Formulation: Patient unable to participate in goal setting Time For Goal Achievement: 06/30/20    Frequency Min 2X/week   Barriers to discharge Decreased caregiver support      Co-evaluation               AM-PAC PT "6 Clicks" Mobility  Outcome Measure Help needed turning from your back to your side while in a flat bed without using bedrails?: Total Help needed moving from lying on your back to sitting on the side of a flat bed without using bedrails?: Total Help needed moving to and from a bed to a chair (including a wheelchair)?: Total Help needed standing up from a chair using your arms (e.g., wheelchair or bedside chair)?: Total Help needed to walk in hospital room?: Total Help needed climbing 3-5 steps with a railing? : Total 6 Click Score: 6    End of Session   Activity Tolerance: Patient limited by lethargy;Patient  limited by pain Patient left: in bed;with bed alarm set Nurse Communication: Mobility status PT Visit Diagnosis: Repeated falls (R29.6);Muscle weakness (generalized) (M62.81);Other abnormalities of gait and mobility (R26.89)    Time: 1230-1250 PT Time Calculation (min) (ACUTE ONLY): 20 min   Charges:   PT Evaluation $PT Eval Moderate Complexity: 1 Mod          Francess Mullen, PT, GCS 06/16/20,3:33 PM

## 2020-06-16 NOTE — Progress Notes (Addendum)
PROGRESS NOTE    [name]   TIW:[id number]  DOB: January 19, 1930  DOA: 06/15/2020 PCP: Baxter Hire, MD   Brief Narrative:  Virginia Clements is an 87 year old female who is wheelchair-bound, able to ambulate to the bathroom with a walker, hypertension, hyperlipidemia, osteoporosis, obesity who presents to the hospital after she fell out of bed and was unable to get back up.  She also admitted to having nausea and vomiting for 2 days and dysuria for 2 weeks. In the ED she was found to have a urinary tract infection and started on IV antibiotics.  She was also found to have an oxygen level of 90% on room air and thought to have fluid overload and given Lasix 40 mg IV once.   Subjective: She states she has pain in the middle of her back today.  She has no other complaints.  She is no longer nauseated or vomiting but also did not eat much this morning.    Assessment & Plan:   Principal Problem:   UTI (urinary tract infection) with sepsis - HR in low 100s and RR in high 20s -With dysuria x 2 wks and WBC count of 13.6. - UA> many bacteria, > 50 WBC, Nitrite +, 21-50 RBCs - Continue ceftriaxone and follow-up on urine culture- so far it is growing > 100K E coli  Active Problems:  Nausea/vomiting and poor oral intake -Possibly related to untreated UTI-although her vomiting has resolved, her oral intake remains poor-continue to follow  AKI - Cr was 0.54 on 06/03/18- Cr 1.11 today - poor oral intake -As creatinine has gone up today and oral intake remains poor, I will start her on IV fluids - 2 D ECHO shows a normal EF and no diastolic CHF    Wheelchair bound-  Generalized weakness -At baseline, she is able to use a walker and make it to the bathroom-today she was a 2+ assist even to just stand up out of bed and is therefore significantly worse than her baseline-PT has recommended SNF -UTI may be adding to her weakness along with poor oral intake over the past couple  of days    Time spent in minutes: 35 DVT prophylaxis: enoxaparin (LOVENOX) injection 40 mg Start: 06/15/20 1300 Place TED hose Start: 06/15/20 1249  Code Status: DNR Family Communication:  Level of Care: Level of care: Progressive Cardiac Disposition Plan:  Status is: Observation  The patient will require care spanning > 2 midnights and should be moved to inpatient because: Inpatient level of care appropriate due to severity of illness  Dispo: The patient is from: Home              Anticipated d/c is to: SNF              Patient currently is not medically stable to d/c.   Difficult to place patient No  Consultants:   none Procedures:   none Antimicrobials:  Anti-infectives (From admission, onward)   Start     Dose/Rate Route Frequency Ordered Stop   06/16/20 1000  cefTRIAXone (ROCEPHIN) 1 g in sodium chloride 0.9 % 100 mL IVPB        1 g 200 mL/hr over 30 Minutes Intravenous Every 24 hours 06/16/20 0821     06/15/20 1145  cefTRIAXone (ROCEPHIN) 1 g in sodium chloride 0.9 % 100 mL IVPB        1 g 200 mL/hr over 30 Minutes Intravenous  Once 06/15/20 1140 06/15/20 1238  Objective: Vitals:   06/15/20 1922 06/16/20 0347 06/16/20 0856 06/16/20 1244  BP: (!) 154/74 (!) 149/97 (!) 156/66 (!) 161/77  Pulse: 75 85 81 72  Resp: 19 17 18 18   Temp: 99.6 F (37.6 C) 99.1 F (37.3 C) 98.5 F (36.9 C) 99.5 F (37.5 C)  TempSrc: Oral Oral Oral Oral  SpO2: 92% 95% 93% 92%  Weight:  82.1 kg    Height:        Intake/Output Summary (Last 24 hours) at 06/16/2020 1410 Last data filed at 06/16/2020 1353 Gross per 24 hour  Intake 363 ml  Output 200 ml  Net 163 ml   Filed Weights   06/15/20 0856 06/16/20 0347  Weight: 85.7 kg 82.1 kg    Examination: General exam: Appears comfortable  HEENT: PERRLA, oral mucosa moist, no sclera icterus or thrush Respiratory system: Clear to auscultation. Respiratory effort normal. Cardiovascular system: S1 & S2 heard, RRR.    Gastrointestinal system: Abdomen soft, non-tender, nondistended. Normal bowel sounds. Central nervous system: Alert and oriented. No focal neurological deficits. Extremities: No cyanosis, clubbing or edema Skin: No rashes or ulcers Psychiatry:  Mood & affect appropriate.     Data Reviewed: I have personally reviewed following labs and imaging studies  CBC: Recent Labs  Lab 06/15/20 0903 06/16/20 0620  WBC 13.6* 11.1*  HGB 13.0 12.3  HCT 39.8 38.1  MCV 87.1 87.6  PLT 227 449   Basic Metabolic Panel: Recent Labs  Lab 06/15/20 0903 06/16/20 0620  NA 137 137  K 3.7 3.3*  CL 102 98  CO2 24 29  GLUCOSE 184* 118*  BUN 25* 31*  CREATININE 0.82 1.11*  CALCIUM 9.9 10.4*   GFR: Estimated Creatinine Clearance: 34.8 mL/min (A) (by C-G formula based on SCr of 1.11 mg/dL (H)). Liver Function Tests: Recent Labs  Lab 06/15/20 0903  AST 23  ALT 17  ALKPHOS 75  BILITOT 1.2  PROT 6.9  ALBUMIN 3.6   Recent Labs  Lab 06/15/20 0903  LIPASE 28   No results for input(s): AMMONIA in the last 168 hours. Coagulation Profile: No results for input(s): INR, PROTIME in the last 168 hours. Cardiac Enzymes: No results for input(s): CKTOTAL, CKMB, CKMBINDEX, TROPONINI in the last 168 hours. BNP (last 3 results) No results for input(s): PROBNP in the last 8760 hours. HbA1C: No results for input(s): HGBA1C in the last 72 hours. CBG: No results for input(s): GLUCAP in the last 168 hours. Lipid Profile: No results for input(s): CHOL, HDL, LDLCALC, TRIG, CHOLHDL, LDLDIRECT in the last 72 hours. Thyroid Function Tests: Recent Labs    06/15/20 1038  TSH 0.619   Anemia Panel: No results for input(s): VITAMINB12, FOLATE, FERRITIN, TIBC, IRON, RETICCTPCT in the last 72 hours. Urine analysis:    Component Value Date/Time   COLORURINE ORANGE (A) 06/15/2020 0903   APPEARANCEUR TURBID (A) 06/15/2020 0903   APPEARANCEUR Clear 08/10/2011 0226   LABSPEC 1.015 06/15/2020 0903   LABSPEC  1.025 08/10/2011 0226   PHURINE 5.0 06/15/2020 0903   GLUCOSEU NEGATIVE 06/15/2020 0903   GLUCOSEU Negative 08/10/2011 0226   HGBUR MODERATE (A) 06/15/2020 0903   BILIRUBINUR NEGATIVE 06/15/2020 0903   BILIRUBINUR Negative 08/10/2011 0226   KETONESUR NEGATIVE 06/15/2020 0903   PROTEINUR 100 (A) 06/15/2020 0903   NITRITE POSITIVE (A) 06/15/2020 0903   LEUKOCYTESUR TRACE (A) 06/15/2020 0903   LEUKOCYTESUR Negative 08/10/2011 0226   Sepsis Labs: @LABRCNTIP (procalcitonin:4,lacticidven:4) ) Recent Results (from the past 240 hour(s))  Resp Panel by RT-PCR (  Flu A&B, Covid) Nasopharyngeal Swab     Status: None   Collection Time: 06/15/20  9:03 AM   Specimen: Nasopharyngeal Swab; Nasopharyngeal(NP) swabs in vial transport medium  Result Value Ref Range Status   SARS Coronavirus 2 by RT PCR NEGATIVE NEGATIVE Final    Comment: (NOTE) SARS-CoV-2 target nucleic acids are NOT DETECTED.  The SARS-CoV-2 RNA is generally detectable in upper respiratory specimens during the acute phase of infection. The lowest concentration of SARS-CoV-2 viral copies this assay can detect is 138 copies/mL. A negative result does not preclude SARS-Cov-2 infection and should not be used as the sole basis for treatment or other patient management decisions. A negative result may occur with  improper specimen collection/handling, submission of specimen other than nasopharyngeal swab, presence of viral mutation(s) within the areas targeted by this assay, and inadequate number of viral copies(<138 copies/mL). A negative result must be combined with clinical observations, patient history, and epidemiological information. The expected result is Negative.  Fact Sheet for Patients:  EntrepreneurPulse.com.au  Fact Sheet for Healthcare Providers:  IncredibleEmployment.be  This test is no t yet approved or cleared by the [place] FDA and  has been authorized for detection and/or  diagnosis of SARS-CoV-2 by FDA under an Emergency Use Authorization (EUA). This EUA will remain  in effect (meaning this test can be used) for the duration of the COVID-19 declaration under Section 564(b)(1) of the Act, 21 U.S.C.section 360bbb-3(b)(1), unless the authorization is terminated  or revoked sooner.       Influenza A by PCR NEGATIVE NEGATIVE Final   Influenza B by PCR NEGATIVE NEGATIVE Final    Comment: (NOTE) The Xpert Xpress SARS-CoV-2/FLU/RSV plus assay is intended as an aid in the diagnosis of influenza from Nasopharyngeal swab specimens and should not be used as a sole basis for treatment. Nasal washings and aspirates are unacceptable for Xpert Xpress SARS-CoV-2/FLU/RSV testing.  Fact Sheet for Patients: EntrepreneurPulse.com.au  Fact Sheet for Healthcare Providers: IncredibleEmployment.be  This test is not yet approved or cleared by the [place] FDA and has been authorized for detection and/or diagnosis of SARS-CoV-2 by FDA under an Emergency Use Authorization (EUA). This EUA will remain in effect (meaning this test can be used) for the duration of the COVID-19 declaration under Section 564(b)(1) of the Act, 21 U.S.C. section 360bbb-3(b)(1), unless the authorization is terminated or revoked.  Performed at [facility], [address], [place], [place] [address]   Urine Culture     Status: Abnormal (Preliminary result)   Collection Time: 06/15/20  9:03 AM   Specimen: Urine, Catheterized  Result Value Ref Range Status   Specimen Description   Final    URINE, CATHETERIZED Performed at [facility], [address], [place], [place] [address]    Special Requests   Final    NONE Performed at [facility], [address] [place], [place] [address]    Culture (A)  Final    >=100,000 COLONIES/mL ESCHERICHIA COLI SUSCEPTIBILITIES TO FOLLOW Performed at [facility], [place] [address], [place], [place] [address]    Report Status PENDING  Incomplete  Blood culture (routine x 2)     Status: None (Preliminary result)   Collection Time: 06/15/20 11:46 AM   Specimen: BLOOD  Result Value Ref Range Status   Specimen Description BLOOD BLOOD RIGHT HAND  Final   Special Requests   Final    BOTTLES DRAWN AEROBIC AND ANAEROBIC Blood Culture results may not be optimal  due to an inadequate volume of blood received in culture bottles   Culture   Final    NO GROWTH < 24 HOURS Performed at [facility], [address] [place], [place] [address]    Report Status PENDING  Incomplete  Blood culture (routine x 2)     Status: None (Preliminary result)   Collection Time: 06/15/20 12:07 PM   Specimen: BLOOD  Result Value Ref Range Status   Specimen Description BLOOD BLOOD LEFT FOREARM  Final   Special Requests   Final    BOTTLES DRAWN AEROBIC AND ANAEROBIC Blood Culture results may not be optimal due to an inadequate volume of blood received in culture bottles   Culture   Final    NO GROWTH < 24 HOURS Performed at [facility], [address] [place], [place] [address]    Report Status PENDING  Incomplete         Radiology Studies: CT HEAD WO CONTRAST  Result Date: 06/15/2020 CLINICAL DATA:  Altered mental status and neck pain after fall today. No loss of consciousness. EXAM: CT HEAD WITHOUT CONTRAST CT CERVICAL SPINE WITHOUT CONTRAST TECHNIQUE: Multidetector CT imaging of the head and cervical spine was performed following the standard protocol without intravenous contrast. Multiplanar CT image reconstructions of the cervical spine were also generated. COMPARISON:  None. FINDINGS: CT HEAD FINDINGS Brain: Mild diffuse cortical atrophy is noted. Mild chronic ischemic white matter disease is noted. No mass effect or midline shift is noted. Ventricular size is within normal limits. There is no evidence of mass lesion, hemorrhage or acute infarction. Vascular: No  hyperdense vessel or unexpected calcification. Skull: Normal. Negative for fracture or focal lesion. Sinuses/Orbits: No acute finding. Other: None. CT CERVICAL SPINE FINDINGS Alignment: Minimal grade 1 anterolisthesis of C5-6 is noted secondary to posterior facet joint hypertrophy. Skull base and vertebrae: No acute fracture. No primary bone lesion or focal pathologic process. Soft tissues and spinal canal: No prevertebral fluid or swelling. No visible canal hematoma. Disc levels: Moderate to severe degenerative disc disease is noted at C3-4, C4-5, C5-6, C6-7 and C7-T1. Upper chest: Negative. Other: None. IMPRESSION: 1. Mild diffuse cortical atrophy. Mild chronic ischemic white matter disease. No acute intracranial abnormality seen. 2. Moderate to severe multilevel degenerative disc disease. No acute abnormality seen in the cervical spine. Electronically Signed   By: Marijo Conception M.D.   On: 06/15/2020 13:47   CT CERVICAL SPINE WO CONTRAST  Result Date: 06/15/2020 CLINICAL DATA:  Altered mental status and neck pain after fall today. No loss of consciousness. EXAM: CT HEAD WITHOUT CONTRAST CT CERVICAL SPINE WITHOUT CONTRAST TECHNIQUE: Multidetector CT imaging of the head and cervical spine was performed following the standard protocol without intravenous contrast. Multiplanar CT image reconstructions of the cervical spine were also generated. COMPARISON:  None. FINDINGS: CT HEAD FINDINGS Brain: Mild diffuse cortical atrophy is noted. Mild chronic ischemic white matter disease is noted. No mass effect or midline shift is noted. Ventricular size is within normal limits. There is no evidence of mass lesion, hemorrhage or acute infarction. Vascular: No hyperdense vessel or unexpected calcification. Skull: Normal. Negative for fracture or focal lesion. Sinuses/Orbits: No acute finding. Other: None. CT CERVICAL SPINE FINDINGS Alignment: Minimal grade 1 anterolisthesis of C5-6 is noted secondary to posterior facet  joint hypertrophy. Skull base and vertebrae: No acute fracture. No primary bone lesion or focal pathologic process. Soft tissues and spinal canal: No prevertebral fluid or swelling. No visible canal hematoma. Disc levels:  Moderate to severe degenerative disc disease is noted at C3-4, C4-5, C5-6, C6-7 and C7-T1. Upper chest: Negative. Other: None. IMPRESSION: 1. Mild diffuse cortical atrophy. Mild chronic ischemic white matter disease. No acute intracranial abnormality seen. 2. Moderate to severe multilevel degenerative disc disease. No acute abnormality seen in the cervical spine. Electronically Signed   By: Marijo Conception M.D.   On: 06/15/2020 13:47   DG Chest Portable 1 View  Result Date: 06/15/2020 CLINICAL DATA:  Shortness of breath. EXAM: PORTABLE CHEST 1 VIEW COMPARISON:  Aug 10, 2011. FINDINGS: Stable cardiomediastinal silhouette. No pneumothorax or pleural effusion is noted. Stable bilateral opacities are noted which may represent scarring. No definite acute abnormality is noted. Bony thorax is unremarkable. IMPRESSION: No acute cardiopulmonary abnormality seen. Aortic Atherosclerosis (ICD10-I70.0). Electronically Signed   By: Marijo Conception M.D.   On: 06/15/2020 09:22   ECHOCARDIOGRAM COMPLETE  Result Date: 06/16/2020    ECHOCARDIOGRAM REPORT   Patient Name:   TORIANA SPONSEL Date of Exam: 06/16/2020 Medical Rec #:  [id number]               Height:       62.0 in Accession #:    [id number]              Weight:       181.0 lb Date of Birth:  08/28/1929              BSA:          1.832 m Patient Age:    35 years                BP:           149/97 mmHg Patient Gender: F                       HR:           85 bpm. Exam Location:  [facility] Procedure: 2D Echo, Cardiac Doppler and Color Doppler Indications:     Dyspnea R06.00  History:         Patient has no prior history of Echocardiogram examinations.                  Risk Factors:Hypertension. Breast cancer.  Sonographer:     Sherrie Sport RDCS (AE)  Referring Phys:  [id number] AMY N COX Diagnosing Phys: Kate Sable MD  Sonographer Comments: Technically challenging study due to limited acoustic windows, no apical window and no subcostal window. IMPRESSIONS  1. Left ventricular ejection fraction, by estimation, is 60 to 65%. The left ventricle has normal function. The left ventricle has no regional wall motion abnormalities. There is mild left ventricular hypertrophy. Left ventricular diastolic function could not be evaluated.  2. Right ventricular systolic function is normal. The right ventricular size is normal.  3. The mitral valve is grossly normal. No evidence of mitral valve regurgitation.  4. The aortic valve is tricuspid. Aortic valve regurgitation is not visualized. Mild aortic valve sclerosis is present, with no evidence of aortic valve stenosis. FINDINGS  Left Ventricle: Left ventricular ejection fraction, by estimation, is 60 to 65%. The left ventricle has normal function. The left ventricle has no regional wall motion abnormalities. The left ventricular internal cavity size was normal in size. There is  mild left ventricular hypertrophy. Left ventricular diastolic function could not be evaluated. Right Ventricle: The right ventricular size is normal. No increase in right ventricular wall thickness. Right ventricular  systolic function is normal. Left Atrium: Left atrial size was normal in size. Right Atrium: Right atrial size was not well visualized. Pericardium: There is no evidence of pericardial effusion. Mitral Valve: The mitral valve is grossly normal. No evidence of mitral valve regurgitation. Tricuspid Valve: The tricuspid valve is normal in structure. Tricuspid valve regurgitation is not demonstrated. Aortic Valve: The aortic valve is tricuspid. Aortic valve regurgitation is not visualized. Mild aortic valve sclerosis is present, with no evidence of aortic valve stenosis. Pulmonic Valve: The pulmonic valve was not well visualized. Pulmonic  valve regurgitation is not visualized. Aorta: The aortic root is normal in size and structure. Venous: The inferior vena cava was not well visualized. IAS/Shunts: The interatrial septum was not well visualized.  LEFT VENTRICLE PLAX 2D LVIDd:         3.38 cm LVIDs:         2.37 cm LV PW:         1.61 cm LV IVS:        1.14 cm LVOT diam:     2.00 cm LVOT Area:     3.14 cm  LEFT ATRIUM         Index LA diam:    3.90 cm 2.13 cm/m                        PULMONIC VALVE AORTA                 PV Vmax:        0.63 m/s Ao Root diam: 3.53 cm PV Peak grad:   1.6 mmHg                       RVOT Peak grad: 4 mmHg   SHUNTS Systemic Diam: 2.00 cm Kate Sable MD Electronically signed by Kate Sable MD Signature Date/Time: 06/16/2020/1:31:49 PM    Final       Scheduled Meds: . amLODipine  10 mg Oral Daily  . enalapril  10 mg Oral Daily  . enoxaparin (LOVENOX) injection  40 mg Subcutaneous Q24H  . furosemide  40 mg Intravenous BID  . melatonin  5 mg Oral QHS  . metoprolol tartrate  25 mg Oral BID  . mirtazapine  7.5 mg Oral QHS  . sodium chloride flush  3 mL Intravenous Q12H  . timolol  1 drop Both Eyes QHS   Continuous Infusions: . sodium chloride    . cefTRIAXone (ROCEPHIN)  IV 1 g (06/16/20 0927)     LOS: 0 days      Debbe Odea, MD [facility] Pager: [email or web address] 06/16/2020, 2:10 PM

## 2020-06-16 NOTE — Evaluation (Signed)
Occupational Therapy Evaluation Patient Details Name: Virginia Clements MRN: [id number] DOB: [birth year] Today's Date: 06/16/2020    History of Present Illness Pt is an 87 y/o F With PMH: HTN, BRCA, glaucoma, HLD and osteoporosis. Pt's family brought to ED d/t poor PO intake and fall in AM of admission. Pt with reports of dysuria and found to have UTI.   Clinical Impression   Pt seen for OT evaluation this date in setting of acute hospitalization d/t UTI. Pt reports being able to walk very short bouts such as to commode with RW with her sister's assistance in the home environment. Pt is questionable historian as she is not completely oriented to situation or temporal concepts, but this is confirmed by her brother-in-law. Pt reports that she requires assist for LB ADLs from her sister and light assist to get into/out of bed to chair. Pt presents this date with significant weakness, decreased ROM of UEs/LEs and back pain impacting her ability to safely perform ADLs/ADL mobility. Pt requires MAX A +2 to come to sitting at EOB with HOB elevated and increased time. Pt demos Poor static sitting balance this date requiring at least MIN A and UE Support to sustain. In addition, pt requiring MIN/MOD A For seated UB ADLs d/t requiring UE use to support just sustaining sitting. OT makes several attempts to engage pt in transfer either just to standing or to chair, even getting assistance of a second person, but ultimately is unsuccessful in getting safe clearance of patient's bottom from the bed to engage her in transfer to chair. Pt, with MAX To TOTAL A +2 arm in arm, only clears ~2-3" from bed (primarily impacted by leaning back d/t being fearful of falling). Pt will require extensive rehabilitation if she is to return home. Anticipate she will require STR in SNF setting to strengthen and improve at least basic ADL transfers in order to return to home environment with sister's assistance safely.    Follow  Up Recommendations  SNF    Equipment Recommendations  Other (comment) (defer to next level of care, potential lift needs should patient go home.)    Recommendations for Other Services       Precautions / Restrictions Precautions Precautions: Fall Restrictions Weight Bearing Restrictions: No      Mobility Bed Mobility Overal bed mobility: Needs Assistance Bed Mobility: Supine to Sit;Sit to Supine     Supine to sit: Max assist;+2 for physical assistance;+2 for safety/equipment Sit to supine: Total assist;+2 for physical assistance;+2 for safety/equipment   General bed mobility comments: assist for trunk and limbs, cues to sequence. Limited ability to contribute.    Transfers Overall transfer level: Needs assistance               General transfer comment: OT attempts to engage pt in transfer with RW To standing from elevated bed surface and is unsuccessful in getting pt to clear bottom from bed with MAX To TOTAL A +1. Pt continually leaning back rather than forward despite cues. Attempted w/o RW with arm in arm of 2p assisting with MAX A with pt again minmally clearing bottom from bed. Attempted scoot transfer with 2p assist from bed to chair to no avail as pt only makes it half way and is ultimately very limited in her ability to contribute. Anticiapte she will require lift assistance for chair transfers at least initially.    Balance Overall balance assessment: Needs assistance   Sitting balance-Leahy Scale: Poor Sitting balance - Comments: L lateral  lean in static sitting and requires at lease MIN A for support throughout     Standing balance-Leahy Scale: Zero Standing balance comment: attempt to engage pt in stand both with one person assisting and with 2 to no avail as pt only minimally able to clear bottom from bed at this time. With b/l feet and knees being blocked by therapist and mobility specialist, pt's feet somehow still slide forward and pt leans back almost  purposefully as if hoping to get momentum to come to stand in this manor.                           ADL either performed or assessed with clinical judgement   ADL Overall ADL's : Needs assistance/impaired                                       General ADL Comments: MIN/MOD A For seated UB bathing/dressing d/t P sitting balance with L lateral lean, needing constant support throughout. Pt requires TOTAL A For LB ADLs in seated or bed level position. She reports primarily relying on slip on shoes at baseline and reports that her sister helps change her diaper.     Vision Patient Visual Report: No change from baseline Additional Comments: pt primarily tracks appropriately, but is noted to have R visual gaze preference, and requires cues to visually attend.     Perception     Praxis      Pertinent Vitals/Pain Pain Assessment: 0-10 Pain Score: 8  Pain Location: back pain Pain Descriptors / Indicators: Aching;Sore Pain Intervention(s): Limited activity within patient's tolerance;Monitored during session     Hand Dominance Right   Extremity/Trunk Assessment Upper Extremity Assessment Upper Extremity Assessment: Generalized weakness   Lower Extremity Assessment Lower Extremity Assessment: Generalized weakness   Cervical / Trunk Assessment Cervical / Trunk Assessment: Kyphotic   Communication Communication Communication: No difficulties (clear speech, but curt responses, flat affect.)   Cognition Arousal/Alertness: Awake/alert Behavior During Therapy: WFL for tasks assessed/performed;Flat affect Overall Cognitive Status: No family/caregiver present to determine baseline cognitive functioning                                 General Comments: Pt is appropriate with command following, but is flat throughout and is questionable historian. She is oriented to year, but not month and oriented to place, but not situation. She requires extended  processing time and cues.   General Comments       Exercises Other Exercises Other Exercises: OT engaes pt in seated UB bathing and dressing with MIN/MOD A d/t limited shld ROM and UE strength as well as limited static sitting balance. OT educates pt re: importance of OOB Activity, improving core control and strength for simple seated tasks, importance of getting OOB to maintain strength/prevent atrophy. Pt with MIN/MOD understanding, but poor carryover. OT also completed this education with pt's brother-in-law via telephone and engage him in d/c planning with education on therapy intensity in home health versus skilled nursing.   Shoulder Instructions      Home Living Family/patient expects to be discharged to:: Private residence Living Arrangements: Other relatives (sister and BIL) Available Help at Discharge: Family;Available 24 hours/day;Personal care attendant;Available PRN/intermittently (PCA 6 days/wk in evening for a few hours to help pt with reverse ADLs)  Type of Home: House   Entrance Stairs-Number of Steps: pt states ramp, but ?? historian   Home Layout: One level               Home Equipment: Walker - 2 wheels;Wheelchair - manual   Additional Comments: Pt's BIL reports 3 falls in last 2-3 months in which knees buckled.      Prior Functioning/Environment Level of Independence: Needs assistance  Gait / Transfers Assistance Needed: Pt requires w/c primarily for fxl mobility at baseline. She reports she requires some light assist for getting OOB. States she was able to walk short distances with RW with sister's help to the restroom. Pt's BIL confirms this via phone and states he has only declined in the past 2-3 months. ADL's / Homemaking Assistance Needed: Pt was requiring assist from PCA in the evening to perform reverse self care to get ready for bed. Her sister was helping her in AM. Pt was able to contribute to UB ADLs, but requires MAX A For LB At baseline including  bathing and dressing. In addition, pt's sister and brother-in-law perform all HH IADLs.            OT Problem List: Decreased strength;Decreased range of motion;Decreased activity tolerance;Impaired balance (sitting and/or standing);Decreased knowledge of use of DME or AE;Decreased safety awareness;Cardiopulmonary status limiting activity;Impaired sensation;Obesity;Pain      OT Treatment/Interventions: Self-care/ADL training;Therapeutic activities;Balance training;DME and/or AE instruction;Therapeutic exercise;Energy conservation;Patient/family education    OT Goals(Current goals can be found in the care plan section) Acute Rehab OT Goals Patient Stated Goal: to get my breathing better and feel better OT Goal Formulation: With patient Time For Goal Achievement: 06/30/20 Potential to Achieve Goals: Good ADL Goals Pt Will Perform Grooming: with supervision;with min guard assist;sitting (to complete 2-3 g/h tasks to improve static sitting tolernace for fxl ADLs.) Pt Will Perform Upper Body Dressing: with supervision;with min guard assist;sitting (sitting EOB with G/F sitting balance to increase INDEP with UB ADLs.) Pt Will Transfer to Toilet: with max assist;bedside commode;squat pivot transfer (with drop-arm PRN/as available.) Pt/caregiver will Perform Home Exercise Program: Increased strength;Both right and left upper extremity;With minimal assist Additional ADL Goal #1: Pt will demo ability to sustain 5 mins static sitting with CGA to SBA with UE use as needed to improve fxl sitting tolerance and core control for balance and ADLs.  OT Frequency: Min 1X/week   Barriers to D/C: Decreased caregiver support          Co-evaluation              AM-PAC OT "6 Clicks" Daily Activity     Outcome Measure Help from another person eating meals?: A Little Help from another person taking care of personal grooming?: A Little Help from another person toileting, which includes using toliet,  bedpan, or urinal?: Total Help from another person bathing (including washing, rinsing, drying)?: A Lot Help from another person to put on and taking off regular upper body clothing?: A Lot Help from another person to put on and taking off regular lower body clothing?: Total 6 Click Score: 12   End of Session Nurse Communication: Mobility status  Activity Tolerance: Patient tolerated treatment well Patient left: in bed  OT Visit Diagnosis: Unsteadiness on feet (R26.81);Muscle weakness (generalized) (M62.81);History of falling (Z91.81);Repeated falls (R29.6)                Time: 1001-1058 OT Time Calculation (min): 57 min Charges:  OT General Charges $OT Visit: 1 Visit OT  Evaluation $OT Eval Moderate Complexity: 1 Mod OT Treatments $Self Care/Home Management : 23-37 mins $Therapeutic Activity: 8-22 mins  Gerrianne Scale, MS, OTR/L ascom [phone] 06/16/20, 3:18 PM

## 2020-06-16 NOTE — TOC Initial Note (Signed)
Transition of Care [facility]) - Initial/Assessment Note    Patient Details  Name: Virginia Clements MRN: [id number] Date of Birth: [birth year]  [facility] CM/SW Contact:    Kerin Salen, RN Phone Number: 06/16/2020, 3:56 PM  Clinical Narrative:  Spoke with patient with sister, Virginia Clements at bedside. Patient lives with sister and brother in law, states sister does shopping, cooking and pick up medications at the [facility] on [address]. Patient states she has an Aide that come every evening to assist with bath and dressing private pay. Patient states she was able to walk with rolling walker prior to falling last night. When asked if she would consider Rehabilitation for few days to get back to previous level of health, she immediately replied NO, with sister confirming. Sister states they have a nurse that lives nearby that will assist with care.                 Expected Discharge Plan: Skilled Nursing Facility Barriers to Discharge: Continued Medical Work up   Patient Goals and CMS Choice Patient states their goals for this hospitalization and ongoing recovery are:: To return home, refuse SNF   Choice offered to / list presented to : NA  Expected Discharge Plan and Services Expected Discharge Plan: [place] In-house Referral: Clinical Social Work Discharge Planning Services: NA Post Acute Care Choice: NA Living arrangements for the past 2 months: Single Family Home                 DME Arranged: N/A DME Agency: NA       HH Arranged: NA [place] Agency: NA        Prior Living Arrangements/Services Living arrangements for the past 2 months: [place] Lives with:: Relatives Patient language and need for interpreter reviewed:: Yes Do you feel safe going back to the place where you live?: Yes      Need for Family Participation in Patient Care: Yes (Comment) Care giver support system in place?: Yes (comment) Current home services:  Homehealth aide (Private pay at night) Criminal Activity/Legal Involvement Pertinent to Current Situation/Hospitalization: No - Comment as needed  Activities of Daily Living Home Assistive Devices/Equipment: None ADL Screening (condition at time of admission) Patient's cognitive ability adequate to safely complete daily activities?: Yes Is the patient deaf or have difficulty hearing?: No Does the patient have difficulty seeing, even when wearing glasses/contacts?: No Does the patient have difficulty concentrating, remembering, or making decisions?: No Patient able to express need for assistance with ADLs?: Yes Does the patient have difficulty dressing or bathing?: Yes Independently performs ADLs?: No Communication: Needs assistance Does the patient have difficulty walking or climbing stairs?: Yes Weakness of Legs: None Weakness of Arms/Hands: None  Permission Sought/Granted                  Emotional Assessment Appearance:: Appears stated age Attitude/Demeanor/Rapport: Angry,Complaining Affect (typically observed): Angry Orientation: : Oriented to Self,Oriented to Place Alcohol / Substance Use: Not Applicable Psych Involvement: No (comment)  Admission diagnosis:  Lower urinary tract infectious disease [N39.0] UTI (urinary tract infection) [N39.0] Weakness [R53.1] Non-intractable vomiting with nausea, unspecified vomiting type [R11.2] Sepsis secondary to UTI ([place]) [A41.9, N39.0] Patient Active Problem List   Diagnosis Date Noted  . Wheelchair bound 06/16/2020  . Generalized weakness 06/16/2020  . Sepsis secondary to UTI ([place]) 06/16/2020  . UTI (urinary tract infection) 06/15/2020  . Hypertension complicating diabetes ([employer]) 06/15/2020  . Hypokalemia 05/31/2018  PCP:  Baxter Hire, MD Pharmacy:   [facility], [place] [address] [place] [address] Phone: [phone] Fax: [phone]  [id number] [address], [place] [address] Phone: [phone] Fax: [phone]     Social Determinants of Health (SDOH) Interventions    Readmission Risk Interventions Readmission Risk Prevention Plan 06/16/2020  Post Dischage Appt Complete  Medication Screening Complete  Transportation Screening Complete  Some recent data might be hidden

## 2020-06-16 NOTE — Plan of Care (Signed)

## 2020-06-17 ENCOUNTER — Inpatient Hospital Stay: Payer: Medicare Other

## 2020-06-17 ENCOUNTER — Other Ambulatory Visit: Payer: Self-pay

## 2020-06-17 LAB — BASIC METABOLIC PANEL
Anion gap: 7 (ref 5–15)
BUN: 40 mg/dL — ABNORMAL HIGH (ref 8–23)
CO2: 25 mmol/L (ref 22–32)
Calcium: 9.7 mg/dL (ref 8.9–10.3)
Chloride: 102 mmol/L (ref 98–111)
Creatinine, Ser: 1.22 mg/dL — ABNORMAL HIGH (ref 0.44–1.00)
GFR, Estimated: 43 mL/min — ABNORMAL LOW (ref >60)
Glucose, Bld: 100 mg/dL — ABNORMAL HIGH (ref 70–99)
Potassium: 4.1 mmol/L (ref 3.5–5.1)
Sodium: 134 mmol/L — ABNORMAL LOW (ref 135–145)

## 2020-06-17 LAB — URINE CULTURE: Culture: >=100000 — AB

## 2020-06-17 MED ORDER — SODIUM CHLORIDE 0.9 % IV SOLN
1.0000 g | Freq: Three times a day (TID) | INTRAVENOUS | Status: DC
  Filled 2020-06-17 (×3): qty 10

## 2020-06-17 MED ORDER — CEPHALEXIN 500 MG PO CAPS
500.0000 mg | ORAL_CAPSULE | Freq: Three times a day (TID) | ORAL | Status: DC
  Administered 2020-06-17 – 2020-06-18 (×2): 500 mg via ORAL
  Filled 2020-06-17 (×2): qty 1

## 2020-06-17 MED ORDER — CEFAZOLIN SODIUM-DEXTROSE 1-4 GM/50ML-% IV SOLN
1.0000 g | Freq: Three times a day (TID) | INTRAVENOUS | Status: DC
  Administered 2020-06-17: 1 g via INTRAVENOUS
  Filled 2020-06-17 (×5): qty 50

## 2020-06-17 NOTE — Progress Notes (Signed)
Physical Therapy Treatment Patient Details Name: Virginia Clements MRN: [id number] DOB: 04-21-1929 Today's Date: 06/17/2020    History of Present Illness Pt is an 87 y/o F With PMH: HTN, BRCA, glaucoma, HLD and osteoporosis. Pt's family brought to ED d/t poor PO intake and fall in AM of admission. Pt with reports of dysuria and found to have UTI.    PT Comments    Patient agreeable to PT session. Patient states that she prefers to discharge home with her sister, however patient continues to require significant assistance with bed level activity. Max A to sit up on edge of bed with poor sitting tolerance, left lateral lean. Unable to attempt standing with one person assistance due to poor sitting balance and generalized weakness. Sp02 93% on room air with activity, however patient does appear to have increased work of breathing with activity. Recommend PT to maximize independence and facilitate return to prior level of functional.     Follow Up Recommendations  SNF     Equipment Recommendations  None recommended by PT    Recommendations for Other Services       Precautions / Restrictions Precautions Precautions: Fall Restrictions Weight Bearing Restrictions: No    Mobility  Bed Mobility Overal bed mobility: Needs Assistance Bed Mobility: Supine to Sit;Sit to Supine     Supine to sit: Max assist Sit to supine: Max assist   General bed mobility comments: assistance for LE and trunk support. verbal cues for technique.    Transfers                 General transfer comment: unable to attempt standing due to poor sitting tolerance, fatigue with activity. anticipate patient would need +2 person assistance for standing attempts  Ambulation/Gait                 Stairs             Wheelchair Mobility    Modified Rankin (Stroke Patients Only)       Balance Overall balance assessment: Needs assistance;History of Falls Sitting-balance support:  Single extremity supported Sitting balance-Leahy Scale: Poor Sitting balance - Comments: left lateral lean in sitting position. Min A required for midline faciliation. patient has right head/neck rotation preference Postural control: Left lateral lean                                  Cognition Arousal/Alertness: Awake/alert Behavior During Therapy: Flat affect Overall Cognitive Status: No family/caregiver present to determine baseline cognitive functioning                                        Exercises General Exercises - Lower Extremity Ankle Circles/Pumps: AAROM;Strengthening;Both;10 reps;Supine Heel Slides: AAROM;Strengthening;Both;10 reps;Supine (limited ROM knee flexion on right knee due to pain reported) Hip ABduction/ADduction: AAROM;Strengthening;Both;10 reps;Supine Straight Leg Raises: AAROM;Strengthening;Both;10 reps;Supine Other Exercises Other Exercises: verbal and visual cues for exercise technique for strengthening of BLE    General Comments        Pertinent Vitals/Pain Pain Assessment: Faces Faces Pain Scale: Hurts a little bit Pain Location: right knee flexion with active movement, no pain at rest Pain Descriptors / Indicators: Sore Pain Intervention(s): Limited activity within patient's tolerance    Home Living  Prior Function            PT Goals (current goals can now be found in the care plan section) Acute Rehab PT Goals Patient Stated Goal: to go home PT Goal Formulation: With patient Time For Goal Achievement: 06/30/20 Progress towards PT goals: Progressing toward goals    Frequency    Min 2X/week      PT Plan Current plan remains appropriate    Co-evaluation              AM-PAC PT "6 Clicks" Mobility   Outcome Measure  Help needed turning from your back to your side while in a flat bed without using bedrails?: A Lot Help needed moving from lying on your back to  sitting on the side of a flat bed without using bedrails?: A Lot Help needed moving to and from a bed to a chair (including a wheelchair)?: Total Help needed standing up from a chair using your arms (e.g., wheelchair or bedside chair)?: Total Help needed to walk in hospital room?: Total Help needed climbing 3-5 steps with a railing? : Total 6 Click Score: 8    End of Session   Activity Tolerance: Patient limited by fatigue Patient left: in bed;with call bell/phone within reach;with bed alarm set Nurse Communication: Mobility status PT Visit Diagnosis: Repeated falls (R29.6);Muscle weakness (generalized) (M62.81);Other abnormalities of gait and mobility (R26.89)     Time: 3419-3790 PT Time Calculation (min) (ACUTE ONLY): 26 min  Charges:  $Therapeutic Exercise: 8-22 mins $Therapeutic Activity: 8-22 mins                     Virginia Clements, PT, MPT   Percell Locus 06/17/2020, 3:06 PM

## 2020-06-17 NOTE — Progress Notes (Signed)
Patient running low grade fever but does not want any tylenol.  Will continue to monitor.

## 2020-06-17 NOTE — Progress Notes (Signed)
PROGRESS NOTE    Virginia Clements   KZS:[id number]  DOB: 1929/12/19  DOA: 06/15/2020 PCP: Baxter Hire, MD   Brief Narrative:  Virginia Clements is an 87 year old female who is wheelchair-bound, able to ambulate to the bathroom with a walker, hypertension, hyperlipidemia, osteoporosis, obesity who presents to the hospital after she fell out of bed and was unable to get back up.  She also admitted to having nausea and vomiting for 2 days and dysuria for 2 weeks. In the ED she was found to have a urinary tract infection and started on IV antibiotics.  She was also found to have an oxygen level of 90% on room air and thought to have fluid overload and given Lasix 40 mg IV once.   Subjective: No complaints. Refusing to go to SNF but also cannot get out of bed alone.     Assessment & Plan:   Principal Problem:  Sepsis POA  UTI (urinary tract infection) with sepsis- temp 100.7 today- RR still elevated - HR in low 100s and RR in high 20s -With dysuria x 2 wks and WBC count of 13.6. - UA> many bacteria, > 50 WBC, Nitrite +, 21-50 RBCs - Continue ceftriaxone and follow-up on urine culture- so far it is growing > 100K E coli sensitive to Ancef/Keflex- seems to be eating well- will start Keflex  Active Problems:  Nausea/vomiting and poor oral intake -Possibly related to untreated UTI-although her vomiting has resolved, her oral intake seems to be improving-continue to follow  AKI - Cr was 0.54 on 06/03/18,  0.82 when admitted - given Lasix, Enalapril and Toradol- Cr continues to rise and is 1.22 now - renal ultrasound unrevealing- not improving despite starting IVF yesterday - 2 D ECHO shows a normal EF and no diastolic CHF    Wheelchair bound-  Generalized weakness -At baseline, she is able to use a walker and make it to the bathroom -now> 2+ assist even to just stand up out of bed and is therefore significantly worse than her baseline-PT has recommended SNF -UTI may  be adding to her weakness along with poor oral intake over the past couple of days     Time spent in minutes: 35 DVT prophylaxis: enoxaparin (LOVENOX) injection 40 mg Start: 06/15/20 1300 Place TED hose Start: 06/15/20 1249  Code Status: DNR Family Communication:  Level of Care: Level of care: Progressive Disposition Plan:  Status is: inpatient  The patient will require care spanning > 2 midnights and should be moved to inpatient because: Inpatient level of care appropriate due to severity of illness Follow Cr which continues to rise-   Dispo: The patient is from: Home              Anticipated d/c is to: SNF              Patient currently is not medically stable to d/c.   Difficult to place patient No  Consultants:   none Procedures:   none Antimicrobials:  Anti-infectives (From admission, onward)   Start     Dose/Rate Route Frequency Ordered Stop   06/17/20 2000  ceFAZolin (ANCEF) 1 g in sodium chloride 0.9 % 100 mL IVPB        1 g 200 mL/hr over 30 Minutes Intravenous Every 8 hours 06/17/20 1517     06/17/20 0900  ceFAZolin (ANCEF) IVPB 1 g/50 mL premix  Status:  Discontinued        1 g 100 mL/hr over 30  Minutes Intravenous Every 8 hours 06/17/20 0751 06/17/20 1516   06/16/20 1000  cefTRIAXone (ROCEPHIN) 1 g in sodium chloride 0.9 % 100 mL IVPB  Status:  Discontinued        1 g 200 mL/hr over 30 Minutes Intravenous Every 24 hours 06/16/20 0821 06/17/20 0751   06/15/20 1145  cefTRIAXone (ROCEPHIN) 1 g in sodium chloride 0.9 % 100 mL IVPB        1 g 200 mL/hr over 30 Minutes Intravenous  Once 06/15/20 1140 06/15/20 1238       Objective: Vitals:   06/17/20 0416 06/17/20 0758 06/17/20 1149 06/17/20 1600  BP: 135/73 133/86 124/69 (!) 178/78  Pulse: 76 82 75 82  Resp: 20 19 19 20   Temp: 98.6 F (37 C) 98.6 F (37 C) 99 F (37.2 C) (!) 100.7 F (38.2 C)  TempSrc: Oral Oral Oral Oral  SpO2: 93% 93% 92% 92%  Weight: 74.1 kg     Height:        Intake/Output  Summary (Last 24 hours) at 06/17/2020 1605 Last data filed at 06/17/2020 1500 Gross per 24 hour  Intake 2247.54 ml  Output 1150 ml  Net 1097.54 ml   Filed Weights   06/15/20 0856 06/16/20 0347 06/17/20 0416  Weight: 85.7 kg 82.1 kg 74.1 kg    Examination: General exam: Appears comfortable  HEENT: PERRLA, oral mucosa moist, no sclera icterus or thrush Respiratory system: Clear to auscultation. Respiratory effort normal. Cardiovascular system: S1 & S2 heard, regular rate and rhythm Gastrointestinal system: Abdomen soft, non-tender, nondistended. Normal bowel sounds   Central nervous system: Alert and oriented. No focal neurological deficits. Extremities: No cyanosis, clubbing or edema Skin: No rashes or ulcers Psychiatry:  Mood & affect appropriate.     Data Reviewed: I have personally reviewed following labs and imaging studies  CBC: Recent Labs  Lab 06/15/20 0903 06/16/20 0620  WBC 13.6* 11.1*  HGB 13.0 12.3  HCT 39.8 38.1  MCV 87.1 87.6  PLT 227 332   Basic Metabolic Panel: Recent Labs  Lab 06/15/20 0903 06/16/20 0620 06/17/20 0555  NA 137 137 134*  K 3.7 3.3* 4.1  CL 102 98 102  CO2 24 29 25   GLUCOSE 184* 118* 100*  BUN 25* 31* 40*  CREATININE 0.82 1.11* 1.22*  CALCIUM 9.9 10.4* 9.7   GFR: Estimated Creatinine Clearance: 30 mL/min (A) (by C-G formula based on SCr of 1.22 mg/dL (H)). Liver Function Tests: Recent Labs  Lab 06/15/20 0903  AST 23  ALT 17  ALKPHOS 75  BILITOT 1.2  PROT 6.9  ALBUMIN 3.6   Recent Labs  Lab 06/15/20 0903  LIPASE 28   No results for input(s): AMMONIA in the last 168 hours. Coagulation Profile: No results for input(s): INR, PROTIME in the last 168 hours. Cardiac Enzymes: No results for input(s): CKTOTAL, CKMB, CKMBINDEX, TROPONINI in the last 168 hours. BNP (last 3 results) No results for input(s): PROBNP in the last 8760 hours. HbA1C: No results for input(s): HGBA1C in the last 72 hours. CBG: No results for  input(s): GLUCAP in the last 168 hours. Lipid Profile: No results for input(s): CHOL, HDL, LDLCALC, TRIG, CHOLHDL, LDLDIRECT in the last 72 hours. Thyroid Function Tests: Recent Labs    06/15/20 1038  TSH 0.619   Anemia Panel: No results for input(s): VITAMINB12, FOLATE, FERRITIN, TIBC, IRON, RETICCTPCT in the last 72 hours. Urine analysis:    Component Value Date/Time   COLORURINE ORANGE (A) 06/15/2020 9518  APPEARANCEUR TURBID (A) 06/15/2020 0903   APPEARANCEUR Clear 08/10/2011 0226   LABSPEC 1.015 06/15/2020 0903   LABSPEC 1.025 08/10/2011 0226   PHURINE 5.0 06/15/2020 0903   GLUCOSEU NEGATIVE 06/15/2020 0903   GLUCOSEU Negative 08/10/2011 0226   HGBUR MODERATE (A) 06/15/2020 0903   BILIRUBINUR NEGATIVE 06/15/2020 0903   BILIRUBINUR Negative 08/10/2011 0226   KETONESUR NEGATIVE 06/15/2020 0903   PROTEINUR 100 (A) 06/15/2020 0903   NITRITE POSITIVE (A) 06/15/2020 0903   LEUKOCYTESUR TRACE (A) 06/15/2020 0903   LEUKOCYTESUR Negative 08/10/2011 0226   Sepsis Labs: @LABRCNTIP (procalcitonin:4,lacticidven:4) ) Recent Results (from the past 240 hour(s))  Resp Panel by RT-PCR (Flu A&B, Covid) Nasopharyngeal Swab     Status: None   Collection Time: 06/15/20  9:03 AM   Specimen: Nasopharyngeal Swab; Nasopharyngeal(NP) swabs in vial transport medium  Result Value Ref Range Status   SARS Coronavirus 2 by RT PCR NEGATIVE NEGATIVE Final    Comment: (NOTE) SARS-CoV-2 target nucleic acids are NOT DETECTED.  The SARS-CoV-2 RNA is generally detectable in upper respiratory specimens during the acute phase of infection. The lowest concentration of SARS-CoV-2 viral copies this assay can detect is 138 copies/mL. A negative result does not preclude SARS-Cov-2 infection and should not be used as the sole basis for treatment or other patient management decisions. A negative result may occur with  improper specimen collection/handling, submission of specimen other than nasopharyngeal  swab, presence of viral mutation(s) within the areas targeted by this assay, and inadequate number of viral copies(<138 copies/mL). A negative result must be combined with clinical observations, patient history, and epidemiological information. The expected result is Negative.  Fact Sheet for Patients:  EntrepreneurPulse.com.au  Fact Sheet for Healthcare Providers:  IncredibleEmployment.be  This test is no t yet approved or cleared by the [place] FDA and  has been authorized for detection and/or diagnosis of SARS-CoV-2 by FDA under an Emergency Use Authorization (EUA). This EUA will remain  in effect (meaning this test can be used) for the duration of the COVID-19 declaration under Section 564(b)(1) of the Act, 21 U.S.C.section 360bbb-3(b)(1), unless the authorization is terminated  or revoked sooner.       Influenza A by PCR NEGATIVE NEGATIVE Final   Influenza B by PCR NEGATIVE NEGATIVE Final    Comment: (NOTE) The Xpert Xpress SARS-CoV-2/FLU/RSV plus assay is intended as an aid in the diagnosis of influenza from Nasopharyngeal swab specimens and should not be used as a sole basis for treatment. Nasal washings and aspirates are unacceptable for Xpert Xpress SARS-CoV-2/FLU/RSV testing.  Fact Sheet for Patients: EntrepreneurPulse.com.au  Fact Sheet for Healthcare Providers: IncredibleEmployment.be  This test is not yet approved or cleared by the [place] FDA and has been authorized for detection and/or diagnosis of SARS-CoV-2 by FDA under an Emergency Use Authorization (EUA). This EUA will remain in effect (meaning this test can be used) for the duration of the COVID-19 declaration under Section 564(b)(1) of the Act, 21 U.S.C. section 360bbb-3(b)(1), unless the authorization is terminated or revoked.  Performed at [facility], [address] [place], [place] [address]   Urine  Culture     Status: Abnormal   Collection Time: 06/15/20  9:03 AM   Specimen: Urine, Catheterized  Result Value Ref Range Status   Specimen Description   Final    URINE, CATHETERIZED Performed at [facility], [address], [place], [place] [address]    Special Requests   Final    NONE Performed at [facility], [place], [place] [address]    Culture >=100,000 COLONIES/mL ESCHERICHIA COLI (A)  Final   Report Status 06/17/2020 FINAL  Final   Organism ID, Bacteria ESCHERICHIA COLI (A)  Final      Susceptibility   Escherichia coli - MIC*    AMPICILLIN 4 SENSITIVE Sensitive     CEFAZOLIN <=4 SENSITIVE Sensitive     CEFEPIME <=0.12 SENSITIVE Sensitive     CEFTRIAXONE <=0.25 SENSITIVE Sensitive     CIPROFLOXACIN <=0.25 SENSITIVE Sensitive     GENTAMICIN <=1 SENSITIVE Sensitive     IMIPENEM <=0.25 SENSITIVE Sensitive     NITROFURANTOIN <=16 SENSITIVE Sensitive     TRIMETH/SULFA <=20 SENSITIVE Sensitive     AMPICILLIN/SULBACTAM <=2 SENSITIVE Sensitive     PIP/TAZO <=4 SENSITIVE Sensitive     * >=100,000 COLONIES/mL ESCHERICHIA COLI  Blood culture (routine x 2)     Status: None (Preliminary result)   Collection Time: 06/15/20 11:46 AM   Specimen: BLOOD  Result Value Ref Range Status   Specimen Description BLOOD BLOOD RIGHT HAND  Final   Special Requests   Final    BOTTLES DRAWN AEROBIC AND ANAEROBIC Blood Culture results may not be optimal due to an inadequate volume of blood received in culture bottles   Culture   Final    NO GROWTH 2 DAYS Performed at [facility], [address], [place], [place] [address]    Report Status PENDING  Incomplete  Blood culture (routine x 2)     Status: None (Preliminary result)   Collection Time: 06/15/20 12:07 PM   Specimen: BLOOD  Result Value Ref Range Status   Specimen Description BLOOD BLOOD LEFT FOREARM  Final   Special Requests   Final    BOTTLES DRAWN AEROBIC AND ANAEROBIC Blood Culture  results may not be optimal due to an inadequate volume of blood received in culture bottles   Culture   Final    NO GROWTH 2 DAYS Performed at [facility], [address] [place], [place] [address]    Report Status PENDING  Incomplete         Radiology Studies: US RENAL  Result Date: 06/17/2020 CLINICAL DATA:  Acute kidney injury EXAM: RENAL / URINARY TRACT ULTRASOUND COMPLETE COMPARISON:  None. FINDINGS: Right Kidney: Renal measurements: 11.5 x 3.9 x 5.1 cm = volume: 119.9 mL. Echogenicity and renal cortical thickness within normal limits. No mass, perinephric fluid, or hydronephrosis visualized. No sonographically demonstrable calculus or ureterectasis. Left Kidney: Renal measurements: 12.0 x 5.8 x 4.5 cm = volume: 162.3 mL. Echogenicity and renal cortical thickness are within normal limits. No mass, perinephric fluid, or hydronephrosis visualized. No sonographically demonstrable calculus or ureterectasis. Bladder: Appears normal for degree of bladder distention. Other: None. IMPRESSION: Study within normal limits. Electronically Signed   By: Lowella Grip III M.D.   On: 06/17/2020 08:53   ECHOCARDIOGRAM COMPLETE  Result Date: 06/16/2020    ECHOCARDIOGRAM REPORT   Patient Name:   ANGELITA HARNACK Date of Exam: 06/16/2020 Medical Rec #:  [id number]               Height:       62.0 in Accession #:    [id number]              Weight:       181.0 lb Date of Birth:  [birth year]              BSA:  1.832 m Patient Age:    78 years                BP:           149/97 mmHg Patient Gender: F                       HR:           85 bpm. Exam Location:  [facility] Procedure: 2D Echo, Cardiac Doppler and Color Doppler Indications:     Dyspnea R06.00  History:         Patient has no prior history of Echocardiogram examinations.                  Risk Factors:Hypertension. Breast cancer.  Sonographer:     Sherrie Sport RDCS (AE) Referring Phys:  [id number] AMY N COX Diagnosing Phys: Kate Sable MD  Sonographer Comments: Technically challenging study due to limited acoustic windows, no apical window and no subcostal window. IMPRESSIONS  1. Left ventricular ejection fraction, by estimation, is 60 to 65%. The left ventricle has normal function. The left ventricle has no regional wall motion abnormalities. There is mild left ventricular hypertrophy. Left ventricular diastolic function could not be evaluated.  2. Right ventricular systolic function is normal. The right ventricular size is normal.  3. The mitral valve is grossly normal. No evidence of mitral valve regurgitation.  4. The aortic valve is tricuspid. Aortic valve regurgitation is not visualized. Mild aortic valve sclerosis is present, with no evidence of aortic valve stenosis. FINDINGS  Left Ventricle: Left ventricular ejection fraction, by estimation, is 60 to 65%. The left ventricle has normal function. The left ventricle has no regional wall motion abnormalities. The left ventricular internal cavity size was normal in size. There is  mild left ventricular hypertrophy. Left ventricular diastolic function could not be evaluated. Right Ventricle: The right ventricular size is normal. No increase in right ventricular wall thickness. Right ventricular systolic function is normal. Left Atrium: Left atrial size was normal in size. Right Atrium: Right atrial size was not well visualized. Pericardium: There is no evidence of pericardial effusion. Mitral Valve: The mitral valve is grossly normal. No evidence of mitral valve regurgitation. Tricuspid Valve: The tricuspid valve is normal in structure. Tricuspid valve regurgitation is not demonstrated. Aortic Valve: The aortic valve is tricuspid. Aortic valve regurgitation is not visualized. Mild aortic valve sclerosis is present, with no evidence of aortic valve stenosis. Pulmonic Valve: The pulmonic valve was not well visualized. Pulmonic valve regurgitation is not visualized. Aorta: The aortic  root is normal in size and structure. Venous: The inferior vena cava was not well visualized. IAS/Shunts: The interatrial septum was not well visualized.  LEFT VENTRICLE PLAX 2D LVIDd:         3.38 cm LVIDs:         2.37 cm LV PW:         1.61 cm LV IVS:        1.14 cm LVOT diam:     2.00 cm LVOT Area:     3.14 cm  LEFT ATRIUM         Index LA diam:    3.90 cm 2.13 cm/m                        PULMONIC VALVE AORTA                 PV Vmax:  0.63 m/s Ao Root diam: 3.53 cm PV Peak grad:   1.6 mmHg                       RVOT Peak grad: 4 mmHg   SHUNTS Systemic Diam: 2.00 cm Kate Sable MD Electronically signed by Kate Sable MD Signature Date/Time: 06/16/2020/1:31:49 PM    Final       Scheduled Meds: . amLODipine  10 mg Oral Daily  .  ceFAZolin (ANCEF) IV  1 g Intravenous Q8H  . enoxaparin (LOVENOX) injection  40 mg Subcutaneous Q24H  . melatonin  5 mg Oral QHS  . metoprolol tartrate  25 mg Oral BID  . mirtazapine  7.5 mg Oral QHS  . sodium chloride flush  3 mL Intravenous Q12H  . timolol  1 drop Both Eyes QHS   Continuous Infusions: . sodium chloride    . 0.9 % NaCl with KCl 20 mEq / L 75 mL/hr at 06/17/20 0548     LOS: 1 day      Debbe Odea, MD [facility] Pager: [email or web address] 06/17/2020, 4:05 PM

## 2020-06-18 DIAGNOSIS — E1159 Type 2 diabetes mellitus with other circulatory complications: Secondary | ICD-10-CM

## 2020-06-18 DIAGNOSIS — N179 Acute kidney failure, unspecified: Secondary | ICD-10-CM

## 2020-06-18 DIAGNOSIS — Z993 Dependence on wheelchair: Secondary | ICD-10-CM

## 2020-06-18 DIAGNOSIS — I10 Essential (primary) hypertension: Secondary | ICD-10-CM

## 2020-06-18 DIAGNOSIS — I152 Hypertension secondary to endocrine disorders: Secondary | ICD-10-CM

## 2020-06-18 DIAGNOSIS — R1110 Vomiting, unspecified: Secondary | ICD-10-CM

## 2020-06-18 LAB — BASIC METABOLIC PANEL
Anion gap: 8 (ref 5–15)
BUN: 31 mg/dL — ABNORMAL HIGH (ref 8–23)
CO2: 26 mmol/L (ref 22–32)
Calcium: 10.4 mg/dL — ABNORMAL HIGH (ref 8.9–10.3)
Chloride: 101 mmol/L (ref 98–111)
Creatinine, Ser: 0.93 mg/dL (ref 0.44–1.00)
GFR, Estimated: 59 mL/min — ABNORMAL LOW (ref >60)
Glucose, Bld: 95 mg/dL (ref 70–99)
Potassium: 4 mmol/L (ref 3.5–5.1)
Sodium: 135 mmol/L (ref 135–145)

## 2020-06-18 LAB — RESP PANEL BY RT-PCR (FLU A&B, COVID) ARPGX2
Influenza A by PCR: NEGATIVE
Influenza B by PCR: NEGATIVE
SARS Coronavirus 2 by RT PCR: NEGATIVE

## 2020-06-18 MED ORDER — CEPHALEXIN 500 MG PO CAPS
500.0000 mg | ORAL_CAPSULE | Freq: Four times a day (QID) | ORAL | Status: DC
  Administered 2020-06-18: 500 mg via ORAL
  Filled 2020-06-18: qty 1

## 2020-06-18 MED ORDER — CEPHALEXIN 500 MG PO CAPS: 500.0000 mg | ORAL_CAPSULE | Freq: Four times a day (QID) | ORAL | 0 refills | Status: AC

## 2020-06-18 NOTE — Progress Notes (Signed)
Occupational Therapy Treatment Patient Details Name: Virginia Clements MRN: [id number] DOB: Jul 07, 1929 Today's Date: 06/18/2020    History of present illness Pt is an 87 y/o F With PMH: HTN, BRCA, glaucoma, HLD and osteoporosis. Pt's family brought to ED d/t poor PO intake and fall in AM of admission. Pt with reports of dysuria and found to have UTI.   OT comments  Pt seen for OT evaluation this date to f/u re: safety with ADLs/ADL mobility. OT engages pt in sup to sit with MOD A, demonstrating improvement from last OT session. Pt with F sitting balance initially, but gradually becomes Poor with fatigue as pt starts to gradually assume more of a L lateral lean. Pt requires MIN A for righting to midline as well as cues as she does not appear aware of leaning. Pt requires MAX A arm in arm and MAX A with RW to attempt to sit-to-stands. Pt with MIN/MOD clearance from EOB, demonstrating progress with functional transfers this date, but still with hips significantly flexed. SPS transfer attempted, but pt with large episode of bowel incontinence requiring TOTAL A to safely get back to EOB sitting and TOTAL A for sit to sup and repositioning. CNA assists with MAX A +2 rolling bed level and MAX A LB ADLs including bathing and peri care with cues to engage pt in anterior peri care to contribute to this ADL task. Pt left in bed with all needs met and in reach, CNA finishing up replacing linens. Will continue to follow acutely, but continue to anticipate that pt will require significant rehabilitation from occupational therapy to reasonably return to her sister's house and be able to assist her sister and BIL in transfers and self care to reduce caregiver burden.   Follow Up Recommendations  SNF    Equipment Recommendations  Other (comment) (defer)    Recommendations for Other Services      Precautions / Restrictions Precautions Precautions: Fall Restrictions Weight Bearing Restrictions: No        Mobility Bed Mobility Overal bed mobility: Needs Assistance Bed Mobility: Supine to Sit;Sit to Supine     Supine to sit: Mod assist;HOB elevated Sit to supine: Max assist;Total assist   General bed mobility comments: increased assistance for back to bed d/t fatigue    Transfers Overall transfer level: Needs assistance Equipment used: Rolling walker (2 wheeled) Transfers: Sit to/from Stand Sit to Stand: Max assist;Total assist         General transfer comment: pt iniitally able to minimally clear bottom from bed with MAX A, but on subsequent trials, requires TOTAL A d/t fatgiue. Plan was to attempt to pivot to chair since pt's sit to stand was better, but pt starts to have loose BM in standing and starts to squat requriing OT to assist her back to bed with TOTAL A    Balance Overall balance assessment: Needs assistance;History of Falls Sitting-balance support: Feet supported Sitting balance-Leahy Scale: Fair Sitting balance - Comments: left lateral lean in sitting position. Min A required for midline faciliation. MOD A as pt fatigues. Postural control: Left lateral lean Standing balance support: Bilateral upper extremity supported Standing balance-Leahy Scale: Zero Standing balance comment: pt only minimally clears bottom with MAX A                           ADL either performed or assessed with clinical judgement   ADL Overall ADL's : Needs assistance/impaired  Upper Body Bathing: Moderate assistance;Bed level   Lower Body Bathing: Maximal assistance;Total assistance;+2 for physical assistance;Bed level Lower Body Bathing Details (indicate cue type and reason): rolling Upper Body Dressing : Minimal assistance;Bed level           Toileting- Clothing Manipulation and Hygiene: Maximal assistance;Total assistance;+2 for physical assistance Toileting - Clothing Manipulation Details (indicate cue type and reason): rolling in bed with CNA assisting  after episode of bowel incontinence             Vision Patient Visual Report: No change from baseline     Perception     Praxis      Cognition Arousal/Alertness: Awake/alert Behavior During Therapy: WFL for tasks assessed/performed;Flat affect Overall Cognitive Status: Within Functional Limits for tasks assessed                                 General Comments: Pt is more conversational today and she is oriented x4. She is still fairly flat in affect which might be pt's norm        Exercises Other Exercises Other Exercises: OT engages pt in transfer trials x2 for sit to stand with MAX A arm in arm as well as attempted with RW. Pt able to minimally/moderately clear bottom from EOB. OT attempts to engage pt in SPS unsuccessfully. Pt starts to have loose BM in the midst of this attempt and requires TOTAL A to safely return to EOB sitting and MAX/TOTAL A to return to bed. CNA assisted with MAX TOTAL A lower body ADLs including peri care, bathing, and MAX A +2 for rolling bed level. Pt left with all needs met and in reach. CNA finishing up setting up pt's bed.   Shoulder Instructions       General Comments      Pertinent Vitals/ Pain       Pain Assessment: Faces Faces Pain Scale: Hurts little more Pain Location: R knee Pain Descriptors / Indicators: Sore Pain Intervention(s): Limited activity within patient's tolerance;Monitored during session;Repositioned  Home Living                                          Prior Functioning/Environment              Frequency  Min 1X/week        Progress Toward Goals  OT Goals(current goals can now be found in the care plan section)  Progress towards OT goals: Progressing toward goals  Acute Rehab OT Goals Patient Stated Goal: to get stronger to be less trouble for my sister and brother in law OT Goal Formulation: With patient Time For Goal Achievement: 06/30/20 Potential to Achieve Goals:  Good  Plan Discharge plan remains appropriate    Co-evaluation                 AM-PAC OT "6 Clicks" Daily Activity     Outcome Measure   Help from another person eating meals?: A Little Help from another person taking care of personal grooming?: A Little Help from another person toileting, which includes using toliet, bedpan, or urinal?: Total Help from another person bathing (including washing, rinsing, drying)?: A Lot Help from another person to put on and taking off regular upper body clothing?: A Lot Help from another person to put on and taking off  regular lower body clothing?: Total 6 Click Score: 12    End of Session Equipment Utilized During Treatment: Gait belt;Rolling walker  OT Visit Diagnosis: Unsteadiness on feet (R26.81);Muscle weakness (generalized) (M62.81);History of falling (Z91.81);Repeated falls (R29.6)   Activity Tolerance Patient tolerated treatment well;Patient limited by pain   Patient Left in bed   Nurse Communication Mobility status        Time: 7141-0677 OT Time Calculation (min): 68 min  Charges: OT General Charges $OT Visit: 1 Visit OT Treatments $Self Care/Home Management : 38-52 mins $Therapeutic Activity: 23-37 mins  Gerrianne Scale, MS, OTR/L ascom [phone] 06/18/20, 3:01 PM

## 2020-06-18 NOTE — Plan of Care (Signed)

## 2020-06-18 NOTE — TOC Transition Note (Signed)
Transition of Care [facility]) - CM/SW Discharge Note   Patient Details  Name: AMAIA LAVALLIE MRN: [id number] Date of Birth: Oct 14, 1929  [facility]) CM/SW Contact:  Kerin Salen, RN Phone Number: 06/18/2020, 2:03 PM   Clinical Narrative:  Patient to be discharged to [facility], room 19-B. Family called.     Final next level of care: Skilled Nursing Facility Barriers to Discharge: Barriers Resolved   Patient Goals and CMS Choice Patient states their goals for this hospitalization and ongoing recovery are:: Go to SNF. CMS Medicare.gov Compare Post Acute Care list provided to:: Patient Choice offered to / list presented to : Patient  Discharge Placement                Patient to be transferred to facility by: [facility] Name of family member notified: Son-in-law. Patient and family notified of of transfer: 06/18/20  Discharge Plan and Services In-house Referral: Clinical Social Work Discharge Planning Services: NA Post Acute Care Choice: NA          DME Arranged: N/A DME Agency: NA       HH Arranged: NA HH Agency: NA        Social Determinants of Health (SDOH) Interventions     Readmission Risk Interventions Readmission Risk Prevention Plan 06/16/2020  Post Dischage Appt Complete  Medication Screening Complete  Transportation Screening Complete  Some recent data might be hidden

## 2020-06-18 NOTE — Progress Notes (Addendum)
Physical Therapy Treatment Patient Details Name: Virginia Clements MRN: [id number] DOB: 1930-01-25 Today's Date: 06/18/2020    History of Present Illness Pt is an 87 y/o F With PMH: HTN, BRCA, glaucoma, HLD and osteoporosis. Pt's family brought to ED d/t poor PO intake and fall in AM of admission. Pt with reports of dysuria and found to have UTI.    PT Comments    Pt seen this am prior to d/c.  Pt able to get to EOB with min a x 1.  On second stand she is successful at standing but requires heavy mod a x 1 and only stand briefly.  She and family in room all agree and have decided that SNF would be beneficial and that they would struggle at home.  Discussed with charge RN who relayed request to MD and TOC.  Rolling left/right for dry linen with mod a x 1.  She is unaware she is soiled.  If discharged home she will need EMS transport, hospital bed and HHPT.  Family will have significant difficulty caring for her in her current level of function.   Follow Up Recommendations  SNF     Equipment Recommendations  None recommended by PT    Recommendations for Other Services       Precautions / Restrictions Precautions Precautions: Fall Restrictions Weight Bearing Restrictions: No    Mobility  Bed Mobility Overal bed mobility: Needs Assistance Bed Mobility: Supine to Sit;Sit to Supine Rolling: Mod assist   Supine to sit: Mod assist Sit to supine: Max assist        Transfers Overall transfer level: Needs assistance Equipment used: Rolling walker (2 wheeled) Transfers: Sit to/from Stand Sit to Stand: Mod assist         General transfer comment: unable to stand on first attempt, second was able to get up but heavy post lean  Ambulation/Gait             General Gait Details: unable   Stairs             Wheelchair Mobility    Modified Rankin (Stroke Patients Only)       Balance Overall balance assessment: Needs assistance;History of  Falls Sitting-balance support: Feet supported Sitting balance-Leahy Scale: Fair     Standing balance support: Bilateral upper extremity supported Standing balance-Leahy Scale: Poor                              Cognition Arousal/Alertness: Awake/alert Behavior During Therapy: WFL for tasks assessed/performed Overall Cognitive Status: Within Functional Limits for tasks assessed                                        Exercises      General Comments        Pertinent Vitals/Pain Pain Assessment: Faces Faces Pain Scale: Hurts little more Pain Location: R knee Pain Descriptors / Indicators: Sore Pain Intervention(s): Limited activity within patient's tolerance;Monitored during session;Repositioned    Home Living                      Prior Function            PT Goals (current goals can now be found in the care plan section) Progress towards PT goals: Progressing toward goals    Frequency    Min  2X/week      PT Plan Current plan remains appropriate    Co-evaluation              AM-PAC PT "6 Clicks" Mobility   Outcome Measure  Help needed turning from your back to your side while in a flat bed without using bedrails?: A Lot Help needed moving from lying on your back to sitting on the side of a flat bed without using bedrails?: A Lot Help needed moving to and from a bed to a chair (including a wheelchair)?: Total Help needed standing up from a chair using your arms (e.g., wheelchair or bedside chair)?: Total Help needed to walk in hospital room?: Total Help needed climbing 3-5 steps with a railing? : Total 6 Click Score: 8    End of Session Equipment Utilized During Treatment: Gait belt Activity Tolerance: Patient limited by fatigue Patient left: in bed;with call bell/phone within reach;with bed alarm set Nurse Communication: Mobility status PT Visit Diagnosis: Repeated falls (R29.6);Muscle weakness (generalized)  (M62.81);Other abnormalities of gait and mobility (R26.89)     Time: 9584-4171 PT Time Calculation (min) (ACUTE ONLY): 17 min  Charges:  $Therapeutic Activity: 8-22 mins                    Chesley Noon, PTA 06/18/20, 10:14 AM

## 2020-06-18 NOTE — NC FL2 (Signed)
[place] LEVEL OF CARE SCREENING TOOL     IDENTIFICATION  Patient Name: Virginia Clements Birthdate: 1929-06-15 Sex: female Admission Date (Current Location): 06/15/2020  [place] and [place] Number:  Engineering geologist and Address:  [facility], [address], [place], [place] [address]      Provider Number: [id number]  Attending Physician Name and Address:  Debbe Odea, MD  Relative Name and Phone Number:  [name] sister [phone]    Current Level of Care: Hospital Recommended Level of Care: [place] Prior Approval Number:    Date Approved/Denied:   PASRR Number: [id number]  Discharge Plan: SNF    Current Diagnoses: Patient Active Problem List   Diagnosis Date Noted  . AKI (acute kidney injury) ([place]) 06/18/2020  . Vomiting 06/18/2020  . Wheelchair dependent 06/18/2020  . Benign essential HTN 06/18/2020  . Generalized weakness 06/16/2020  . Sepsis secondary to UTI ([place]) 06/16/2020  . Hypokalemia 05/31/2018    Orientation RESPIRATION BLADDER Height & Weight     Self,Time,Situation,Place  Normal Incontinent Weight: 73.6 kg Height:  5\' 2"  (157.5 cm)  BEHAVIORAL SYMPTOMS/MOOD NEUROLOGICAL BOWEL NUTRITION STATUS      Continent Diet  AMBULATORY STATUS COMMUNICATION OF NEEDS Skin   Extensive Assist Verbally                         Personal Care Assistance Level of Assistance  Bathing,Feeding,Dressing Bathing Assistance: Limited assistance Feeding assistance: Limited assistance Dressing Assistance: Limited assistance     Functional Limitations Info  Sight,Hearing,Speech Sight Info: Adequate Hearing Info: Adequate Speech Info: Adequate    SPECIAL CARE FACTORS FREQUENCY  PT (By licensed PT),OT (By licensed OT)     PT Frequency: 5x week OT Frequency: 5x week            Contractures Contractures Info: Not present    Additional Factors Info  Code Status,Allergies Code  Status Info: DNR Allergies Info: Alendronate, Codeine           Current Medications (06/18/2020):  This is the current hospital active medication list Current Facility-Administered Medications  Medication Dose Route Frequency Provider Last Rate Last Admin  . 0.9 %  sodium chloride infusion  250 mL Intravenous PRN Cox, Amy N, DO      . acetaminophen (TYLENOL) tablet 650 mg  650 mg Oral Q6H PRN Lang Snow, NP   650 mg at 06/18/20 0640  . amLODipine (NORVASC) tablet 10 mg  10 mg Oral Daily Cox, Amy N, DO   10 mg at 06/17/20 1003  . cephALEXin (KEFLEX) capsule 500 mg  500 mg Oral Q6H Beers, Brandon D, RPH      . enoxaparin (LOVENOX) injection 40 mg  40 mg Subcutaneous Q24H Cox, Amy N, DO   40 mg at 06/17/20 1215  . melatonin tablet 5 mg  5 mg Oral QHS Cox, Amy N, DO   5 mg at 06/17/20 2126  . metoprolol tartrate (LOPRESSOR) tablet 25 mg  25 mg Oral BID Cox, Amy N, DO   25 mg at 06/17/20 2126  . mirtazapine (REMERON) tablet 7.5 mg  7.5 mg Oral QHS Cox, Amy N, DO   7.5 mg at 06/17/20 2126  . ondansetron (ZOFRAN) injection 4 mg  4 mg Intravenous Q6H PRN Cox, Amy N, DO      . sodium chloride flush (NS) 0.9 % injection 3 mL  3 mL Intravenous Q12H Cox, Amy N, DO  3 mL at 06/17/20 2127  . sodium chloride flush (NS) 0.9 % injection 3 mL  3 mL Intravenous PRN Cox, Amy N, DO      . timolol (TIMOPTIC) 0.5 % ophthalmic solution 1 drop  1 drop Both Eyes QHS Cox, Amy N, DO   1 drop at 06/15/20 2159     Discharge Medications: Please see discharge summary for a list of discharge medications.  Relevant Imaging Results:  Relevant Lab Results:   Additional Information [id number]  Kerin Salen, RN

## 2020-06-18 NOTE — Discharge Summary (Addendum)
Physician Discharge Summary  NERIDA BOIVIN WNU:[id number] DOB: 05/18/1929 DOA: 06/15/2020  PCP: Baxter Hire, MD  Admit date: 06/15/2020 Discharge date: 06/18/2020  Admitted From: home  Disposition:  home   ADDENDUM: AFTER DISCHARGE, BLOOD CULTURES RETURNED POSITIVE FOR E COLI- CEPHALEXIN FOR 7 DAYS IS APPROPRIATE TO COVER THIS   Recommendations for Outpatient Follow-up:  1. none  Home Health:  ordered  Discharge Condition:  stable   CODE STATUS:  DNR   Diet recommendation:  Heart healthy Consultations:  none  Procedures/Studies: . none   Discharge Diagnoses:  Principal Problem:   Sepsis secondary to UTI ([place]) Active Problems:   AKI (acute kidney injury) ([place])   Vomiting   Generalized weakness   Wheelchair dependent   Benign essential HTN     Brief Summary: TIMOTHEA BODENHEIMER is an 87 year old female who is wheelchair-bound, able to ambulate to the bathroom with a walker, hypertension, hyperlipidemia, osteoporosis, obesity who presents to the hospital after she fell out of bed and was unable to get back up.  She also admitted to having nausea and vomiting for 2 days and dysuria for 2 weeks. In the ED she was found to have a urinary tract infection and started on IV antibiotics.  She was also found to have an oxygen level of 90% on room air and thought to have fluid overload and given Lasix 40 mg IV once.   Hospital Course:  Sepsis POA  UTI (urinary tract infection) with sepsis- temp 100.7 - RR elevated - HR in low 100s and RR in high 20s -With dysuria x 2 wks and WBC count of 13.6. - UA> many bacteria, > 50 WBC, Nitrite +, 21-50 RBCs - Continue ceftriaxone and follow-up on urine culture- so far it is growing > 100K E coli sensitive to Ancef/Keflex- seems to be eating well- switched to Keflex for total of 7 days  Active Problems:  Nausea/vomiting and poor oral intake -Possibly related to untreated UTI-although her vomiting has resolved, her  oral intake has improved considerably  AKI - Cr was 0.54 on 06/03/18,  0.82 when admitted - given Lasix, Enalapril and Toradol in ED in setting of dehydration - fluid replaced (IVF) and stopped yesterday -  Cr rose to 1.22 and has improved to 0.93 today - renal ultrasound unrevealing  - 2 D ECHO shows a normal EF and no diastolic CHF    Wheelchair bound-  Generalized weakness -UTI may be adding to her weakness along with poor oral intake over the past couple of days -At baseline, she is able to use a walker and make it to the bathroom -now> 2+ assist even to just stand up out of bed and is therefore significantly worse than her baseline-PT has recommended SNF but she and her sister insist on her going home  Discharge Exam: Vitals:   06/18/20 0347 06/18/20 0733  BP: (!) 167/71 135/72  Pulse: 67 86  Resp: 20 18  Temp: (!) 97.5 F (36.4 C) 98.1 F (36.7 C)  SpO2: 97% 91%   Vitals:   06/17/20 1615 06/17/20 2010 06/18/20 0347 06/18/20 0733  BP: (!) 160/72 135/66 (!) 167/71 135/72  Pulse: 94 92 67 86  Resp:  20 20 18   Temp:  99.3 F (37.4 C) (!) 97.5 F (36.4 C) 98.1 F (36.7 C)  TempSrc:  Oral Oral Oral  SpO2: 95% 92% 97% 91%  Weight:   73.6 kg   Height:        General: Pt is  alert, awake, not in acute distress Cardiovascular: RRR, S1/S2 +, no rubs, no gallops Respiratory: CTA bilaterally, no wheezing, no rhonchi Abdominal: Soft, NT, ND, bowel sounds + Extremities: no edema, no cyanosis   Discharge Instructions  Discharge Instructions    Diet - low sodium heart healthy   Complete by: As directed    Increase activity slowly   Complete by: As directed      Allergies as of 06/18/2020      Reactions   Alendronate Other (See Comments)   Nervousness and shakiness per pt   Codeine    dizziness      Medication List    TAKE these medications   acetaminophen 325 MG tablet Commonly known as: TYLENOL Take 2 tablets (650 mg total) by mouth every 6 (six) hours as  needed for mild pain (or Fever >/= 101).   amLODipine 10 MG tablet Commonly known as: NORVASC Take 1 tablet (10 mg total) by mouth daily.   cephALEXin 500 MG capsule Commonly known as: KEFLEX Take 1 capsule (500 mg total) by mouth every 6 (six) hours for 3 days.   enalapril 10 MG tablet Commonly known as: VASOTEC Take 10 mg by mouth daily.   metoprolol tartrate 25 MG tablet Commonly known as: LOPRESSOR Take 1 tablet (25 mg total) by mouth 2 (two) times daily.   mirtazapine 7.5 MG tablet Commonly known as: REMERON Take 7.5 mg by mouth at bedtime.   naproxen 375 MG tablet Commonly known as: NAPROSYN Take 375 mg by mouth 2 (two) times daily with a meal.   timolol 0.5 % ophthalmic gel-forming Commonly known as: TIMOPTIC-XR Place 1 drop into both eyes at bedtime.       Allergies  Allergen Reactions  . Alendronate Other (See Comments)    Nervousness and shakiness per pt  . Codeine     dizziness      CT HEAD WO CONTRAST  Result Date: 06/15/2020 CLINICAL DATA:  Altered mental status and neck pain after fall today. No loss of consciousness. EXAM: CT HEAD WITHOUT CONTRAST CT CERVICAL SPINE WITHOUT CONTRAST TECHNIQUE: Multidetector CT imaging of the head and cervical spine was performed following the standard protocol without intravenous contrast. Multiplanar CT image reconstructions of the cervical spine were also generated. COMPARISON:  None. FINDINGS: CT HEAD FINDINGS Brain: Mild diffuse cortical atrophy is noted. Mild chronic ischemic white matter disease is noted. No mass effect or midline shift is noted. Ventricular size is within normal limits. There is no evidence of mass lesion, hemorrhage or acute infarction. Vascular: No hyperdense vessel or unexpected calcification. Skull: Normal. Negative for fracture or focal lesion. Sinuses/Orbits: No acute finding. Other: None. CT CERVICAL SPINE FINDINGS Alignment: Minimal grade 1 anterolisthesis of C5-6 is noted secondary to  posterior facet joint hypertrophy. Skull base and vertebrae: No acute fracture. No primary bone lesion or focal pathologic process. Soft tissues and spinal canal: No prevertebral fluid or swelling. No visible canal hematoma. Disc levels: Moderate to severe degenerative disc disease is noted at C3-4, C4-5, C5-6, C6-7 and C7-T1. Upper chest: Negative. Other: None. IMPRESSION: 1. Mild diffuse cortical atrophy. Mild chronic ischemic white matter disease. No acute intracranial abnormality seen. 2. Moderate to severe multilevel degenerative disc disease. No acute abnormality seen in the cervical spine. Electronically Signed   By: Marijo Conception M.D.   On: 06/15/2020 13:47   CT CERVICAL SPINE WO CONTRAST  Result Date: 06/15/2020 CLINICAL DATA:  Altered mental status and neck pain after fall today. No  loss of consciousness. EXAM: CT HEAD WITHOUT CONTRAST CT CERVICAL SPINE WITHOUT CONTRAST TECHNIQUE: Multidetector CT imaging of the head and cervical spine was performed following the standard protocol without intravenous contrast. Multiplanar CT image reconstructions of the cervical spine were also generated. COMPARISON:  None. FINDINGS: CT HEAD FINDINGS Brain: Mild diffuse cortical atrophy is noted. Mild chronic ischemic white matter disease is noted. No mass effect or midline shift is noted. Ventricular size is within normal limits. There is no evidence of mass lesion, hemorrhage or acute infarction. Vascular: No hyperdense vessel or unexpected calcification. Skull: Normal. Negative for fracture or focal lesion. Sinuses/Orbits: No acute finding. Other: None. CT CERVICAL SPINE FINDINGS Alignment: Minimal grade 1 anterolisthesis of C5-6 is noted secondary to posterior facet joint hypertrophy. Skull base and vertebrae: No acute fracture. No primary bone lesion or focal pathologic process. Soft tissues and spinal canal: No prevertebral fluid or swelling. No visible canal hematoma. Disc levels: Moderate to severe  degenerative disc disease is noted at C3-4, C4-5, C5-6, C6-7 and C7-T1. Upper chest: Negative. Other: None. IMPRESSION: 1. Mild diffuse cortical atrophy. Mild chronic ischemic white matter disease. No acute intracranial abnormality seen. 2. Moderate to severe multilevel degenerative disc disease. No acute abnormality seen in the cervical spine. Electronically Signed   By: Marijo Conception M.D.   On: 06/15/2020 13:47   US RENAL  Result Date: 06/17/2020 CLINICAL DATA:  Acute kidney injury EXAM: RENAL / URINARY TRACT ULTRASOUND COMPLETE COMPARISON:  None. FINDINGS: Right Kidney: Renal measurements: 11.5 x 3.9 x 5.1 cm = volume: 119.9 mL. Echogenicity and renal cortical thickness within normal limits. No mass, perinephric fluid, or hydronephrosis visualized. No sonographically demonstrable calculus or ureterectasis. Left Kidney: Renal measurements: 12.0 x 5.8 x 4.5 cm = volume: 162.3 mL. Echogenicity and renal cortical thickness are within normal limits. No mass, perinephric fluid, or hydronephrosis visualized. No sonographically demonstrable calculus or ureterectasis. Bladder: Appears normal for degree of bladder distention. Other: None. IMPRESSION: Study within normal limits. Electronically Signed   By: Lowella Grip III M.D.   On: 06/17/2020 08:53   DG Chest Portable 1 View  Result Date: 06/15/2020 CLINICAL DATA:  Shortness of breath. EXAM: PORTABLE CHEST 1 VIEW COMPARISON:  Aug 10, 2011. FINDINGS: Stable cardiomediastinal silhouette. No pneumothorax or pleural effusion is noted. Stable bilateral opacities are noted which may represent scarring. No definite acute abnormality is noted. Bony thorax is unremarkable. IMPRESSION: No acute cardiopulmonary abnormality seen. Aortic Atherosclerosis (ICD10-I70.0). Electronically Signed   By: Marijo Conception M.D.   On: 06/15/2020 09:22   ECHOCARDIOGRAM COMPLETE  Result Date: 06/16/2020    ECHOCARDIOGRAM REPORT   Patient Name:   TIRSA GAIL Date of  Exam: 06/16/2020 Medical Rec #:  [id number]               Height:       62.0 in Accession #:    [id number]              Weight:       181.0 lb Date of Birth:  12-16-1929              BSA:          1.832 m Patient Age:    58 years                BP:           149/97 mmHg Patient Gender: F  HR:           85 bpm. Exam Location:  [facility] Procedure: 2D Echo, Cardiac Doppler and Color Doppler Indications:     Dyspnea R06.00  History:         Patient has no prior history of Echocardiogram examinations.                  Risk Factors:Hypertension. Breast cancer.  Sonographer:     Sherrie Sport RDCS (AE) Referring Phys:  [id number] AMY N COX Diagnosing Phys: Kate Sable MD  Sonographer Comments: Technically challenging study due to limited acoustic windows, no apical window and no subcostal window. IMPRESSIONS  1. Left ventricular ejection fraction, by estimation, is 60 to 65%. The left ventricle has normal function. The left ventricle has no regional wall motion abnormalities. There is mild left ventricular hypertrophy. Left ventricular diastolic function could not be evaluated.  2. Right ventricular systolic function is normal. The right ventricular size is normal.  3. The mitral valve is grossly normal. No evidence of mitral valve regurgitation.  4. The aortic valve is tricuspid. Aortic valve regurgitation is not visualized. Mild aortic valve sclerosis is present, with no evidence of aortic valve stenosis. FINDINGS  Left Ventricle: Left ventricular ejection fraction, by estimation, is 60 to 65%. The left ventricle has normal function. The left ventricle has no regional wall motion abnormalities. The left ventricular internal cavity size was normal in size. There is  mild left ventricular hypertrophy. Left ventricular diastolic function could not be evaluated. Right Ventricle: The right ventricular size is normal. No increase in right ventricular wall thickness. Right ventricular systolic function is  normal. Left Atrium: Left atrial size was normal in size. Right Atrium: Right atrial size was not well visualized. Pericardium: There is no evidence of pericardial effusion. Mitral Valve: The mitral valve is grossly normal. No evidence of mitral valve regurgitation. Tricuspid Valve: The tricuspid valve is normal in structure. Tricuspid valve regurgitation is not demonstrated. Aortic Valve: The aortic valve is tricuspid. Aortic valve regurgitation is not visualized. Mild aortic valve sclerosis is present, with no evidence of aortic valve stenosis. Pulmonic Valve: The pulmonic valve was not well visualized. Pulmonic valve regurgitation is not visualized. Aorta: The aortic root is normal in size and structure. Venous: The inferior vena cava was not well visualized. IAS/Shunts: The interatrial septum was not well visualized.  LEFT VENTRICLE PLAX 2D LVIDd:         3.38 cm LVIDs:         2.37 cm LV PW:         1.61 cm LV IVS:        1.14 cm LVOT diam:     2.00 cm LVOT Area:     3.14 cm  LEFT ATRIUM         Index LA diam:    3.90 cm 2.13 cm/m                        PULMONIC VALVE AORTA                 PV Vmax:        0.63 m/s Ao Root diam: 3.53 cm PV Peak grad:   1.6 mmHg                       RVOT Peak grad: 4 mmHg   SHUNTS Systemic Diam: 2.00 cm Kate Sable MD Electronically signed by Kate Sable  MD Signature Date/Time: 06/16/2020/1:31:49 PM    Final      The results of significant diagnostics from this hospitalization (including imaging, microbiology, ancillary and laboratory) are listed below for reference.     Microbiology: Recent Results (from the past 240 hour(s))  Resp Panel by RT-PCR (Flu A&B, Covid) Nasopharyngeal Swab     Status: None   Collection Time: 06/15/20  9:03 AM   Specimen: Nasopharyngeal Swab; Nasopharyngeal(NP) swabs in vial transport medium  Result Value Ref Range Status   SARS Coronavirus 2 by RT PCR NEGATIVE NEGATIVE Final    Comment: (NOTE) SARS-CoV-2 target nucleic  acids are NOT DETECTED.  The SARS-CoV-2 RNA is generally detectable in upper respiratory specimens during the acute phase of infection. The lowest concentration of SARS-CoV-2 viral copies this assay can detect is 138 copies/mL. A negative result does not preclude SARS-Cov-2 infection and should not be used as the sole basis for treatment or other patient management decisions. A negative result may occur with  improper specimen collection/handling, submission of specimen other than nasopharyngeal swab, presence of viral mutation(s) within the areas targeted by this assay, and inadequate number of viral copies(<138 copies/mL). A negative result must be combined with clinical observations, patient history, and epidemiological information. The expected result is Negative.  Fact Sheet for Patients:  EntrepreneurPulse.com.au  Fact Sheet for Healthcare Providers:  IncredibleEmployment.be  This test is no t yet approved or cleared by the [place] FDA and  has been authorized for detection and/or diagnosis of SARS-CoV-2 by FDA under an Emergency Use Authorization (EUA). This EUA will remain  in effect (meaning this test can be used) for the duration of the COVID-19 declaration under Section 564(b)(1) of the Act, 21 U.S.C.section 360bbb-3(b)(1), unless the authorization is terminated  or revoked sooner.       Influenza A by PCR NEGATIVE NEGATIVE Final   Influenza B by PCR NEGATIVE NEGATIVE Final    Comment: (NOTE) The Xpert Xpress SARS-CoV-2/FLU/RSV plus assay is intended as an aid in the diagnosis of influenza from Nasopharyngeal swab specimens and should not be used as a sole basis for treatment. Nasal washings and aspirates are unacceptable for Xpert Xpress SARS-CoV-2/FLU/RSV testing.  Fact Sheet for Patients: EntrepreneurPulse.com.au  Fact Sheet for Healthcare Providers: IncredibleEmployment.be  This  test is not yet approved or cleared by the [place] FDA and has been authorized for detection and/or diagnosis of SARS-CoV-2 by FDA under an Emergency Use Authorization (EUA). This EUA will remain in effect (meaning this test can be used) for the duration of the COVID-19 declaration under Section 564(b)(1) of the Act, 21 U.S.C. section 360bbb-3(b)(1), unless the authorization is terminated or revoked.  Performed at [facility], [address] [place], [place] [address]   Urine Culture     Status: Abnormal   Collection Time: 06/15/20  9:03 AM   Specimen: Urine, Catheterized  Result Value Ref Range Status   Specimen Description   Final    URINE, CATHETERIZED Performed at [facility], [address], [place], [place] [address]    Special Requests   Final    NONE Performed at [facility], [address] [place], [place] [address]    Culture >=100,000 COLONIES/mL ESCHERICHIA COLI (A)  Final   Report Status 06/17/2020 FINAL  Final   Organism ID, Bacteria ESCHERICHIA COLI (A)  Final      Susceptibility   Escherichia coli - MIC*    AMPICILLIN 4 SENSITIVE Sensitive     CEFAZOLIN <=  4 SENSITIVE Sensitive     CEFEPIME <=0.12 SENSITIVE Sensitive     CEFTRIAXONE <=0.25 SENSITIVE Sensitive     CIPROFLOXACIN <=0.25 SENSITIVE Sensitive     GENTAMICIN <=1 SENSITIVE Sensitive     IMIPENEM <=0.25 SENSITIVE Sensitive     NITROFURANTOIN <=16 SENSITIVE Sensitive     TRIMETH/SULFA <=20 SENSITIVE Sensitive     AMPICILLIN/SULBACTAM <=2 SENSITIVE Sensitive     PIP/TAZO <=4 SENSITIVE Sensitive     * >=100,000 COLONIES/mL ESCHERICHIA COLI  Blood culture (routine x 2)     Status: None (Preliminary result)   Collection Time: 06/15/20 11:46 AM   Specimen: BLOOD  Result Value Ref Range Status   Specimen Description BLOOD BLOOD RIGHT HAND  Final   Special Requests   Final    BOTTLES DRAWN AEROBIC AND ANAEROBIC Blood Culture results may not be optimal due to an  inadequate volume of blood received in culture bottles   Culture   Final    NO GROWTH 3 DAYS Performed at [facility], [address] [place], [place] [address]    Report Status PENDING  Incomplete  Blood culture (routine x 2)     Status: None (Preliminary result)   Collection Time: 06/15/20 12:07 PM   Specimen: BLOOD  Result Value Ref Range Status   Specimen Description BLOOD BLOOD LEFT FOREARM  Final   Special Requests   Final    BOTTLES DRAWN AEROBIC AND ANAEROBIC Blood Culture results may not be optimal due to an inadequate volume of blood received in culture bottles   Culture   Final    NO GROWTH 3 DAYS Performed at [facility], [address] [place], [place] [address]    Report Status PENDING  Incomplete     Labs: BNP (last 3 results) Recent Labs    06/15/20 0903  BNP 29.9   Basic Metabolic Panel: Recent Labs  Lab 06/15/20 0903 06/16/20 0620 06/17/20 0555 06/18/20 0546  NA 137 137 134* 135  K 3.7 3.3* 4.1 4.0  CL 102 98 102 101  CO2 24 29 25 26   GLUCOSE 184* 118* 100* 95  BUN 25* 31* 40* 31*  CREATININE 0.82 1.11* 1.22* 0.93  CALCIUM 9.9 10.4* 9.7 10.4*   Liver Function Tests: Recent Labs  Lab 06/15/20 0903  AST 23  ALT 17  ALKPHOS 75  BILITOT 1.2  PROT 6.9  ALBUMIN 3.6   Recent Labs  Lab 06/15/20 0903  LIPASE 28   No results for input(s): AMMONIA in the last 168 hours. CBC: Recent Labs  Lab 06/15/20 0903 06/16/20 0620  WBC 13.6* 11.1*  HGB 13.0 12.3  HCT 39.8 38.1  MCV 87.1 87.6  PLT 227 202   Cardiac Enzymes: No results for input(s): CKTOTAL, CKMB, CKMBINDEX, TROPONINI in the last 168 hours. BNP: Invalid input(s): POCBNP CBG: No results for input(s): GLUCAP in the last 168 hours. D-Dimer No results for input(s): DDIMER in the last 72 hours. Hgb A1c No results for input(s): HGBA1C in the last 72 hours. Lipid Profile No results for input(s): CHOL, HDL, LDLCALC, TRIG, CHOLHDL, LDLDIRECT in the last 72  hours. Thyroid function studies Recent Labs    06/15/20 1038  TSH 0.619   Anemia work up No results for input(s): VITAMINB12, FOLATE, FERRITIN, TIBC, IRON, RETICCTPCT in the last 72 hours. Urinalysis    Component Value Date/Time   COLORURINE ORANGE (A) 06/15/2020 0903   APPEARANCEUR TURBID (A) 06/15/2020 0903   APPEARANCEUR Clear 08/10/2011 0226   LABSPEC  1.015 06/15/2020 0903   LABSPEC 1.025 08/10/2011 0226   PHURINE 5.0 06/15/2020 0903   GLUCOSEU NEGATIVE 06/15/2020 0903   GLUCOSEU Negative 08/10/2011 0226   HGBUR MODERATE (A) 06/15/2020 0903   BILIRUBINUR NEGATIVE 06/15/2020 0903   BILIRUBINUR Negative 08/10/2011 0226   KETONESUR NEGATIVE 06/15/2020 0903   PROTEINUR 100 (A) 06/15/2020 0903   NITRITE POSITIVE (A) 06/15/2020 0903   LEUKOCYTESUR TRACE (A) 06/15/2020 0903   LEUKOCYTESUR Negative 08/10/2011 0226   Sepsis Labs Invalid input(s): PROCALCITONIN,  WBC,  LACTICIDVEN Microbiology Recent Results (from the past 240 hour(s))  Resp Panel by RT-PCR (Flu A&B, Covid) Nasopharyngeal Swab     Status: None   Collection Time: 06/15/20  9:03 AM   Specimen: Nasopharyngeal Swab; Nasopharyngeal(NP) swabs in vial transport medium  Result Value Ref Range Status   SARS Coronavirus 2 by RT PCR NEGATIVE NEGATIVE Final    Comment: (NOTE) SARS-CoV-2 target nucleic acids are NOT DETECTED.  The SARS-CoV-2 RNA is generally detectable in upper respiratory specimens during the acute phase of infection. The lowest concentration of SARS-CoV-2 viral copies this assay can detect is 138 copies/mL. A negative result does not preclude SARS-Cov-2 infection and should not be used as the sole basis for treatment or other patient management decisions. A negative result may occur with  improper specimen collection/handling, submission of specimen other than nasopharyngeal swab, presence of viral mutation(s) within the areas targeted by this assay, and inadequate number of viral copies(<138  copies/mL). A negative result must be combined with clinical observations, patient history, and epidemiological information. The expected result is Negative.  Fact Sheet for Patients:  EntrepreneurPulse.com.au  Fact Sheet for Healthcare Providers:  IncredibleEmployment.be  This test is no t yet approved or cleared by the [place] FDA and  has been authorized for detection and/or diagnosis of SARS-CoV-2 by FDA under an Emergency Use Authorization (EUA). This EUA will remain  in effect (meaning this test can be used) for the duration of the COVID-19 declaration under Section 564(b)(1) of the Act, 21 U.S.C.section 360bbb-3(b)(1), unless the authorization is terminated  or revoked sooner.       Influenza A by PCR NEGATIVE NEGATIVE Final   Influenza B by PCR NEGATIVE NEGATIVE Final    Comment: (NOTE) The Xpert Xpress SARS-CoV-2/FLU/RSV plus assay is intended as an aid in the diagnosis of influenza from Nasopharyngeal swab specimens and should not be used as a sole basis for treatment. Nasal washings and aspirates are unacceptable for Xpert Xpress SARS-CoV-2/FLU/RSV testing.  Fact Sheet for Patients: EntrepreneurPulse.com.au  Fact Sheet for Healthcare Providers: IncredibleEmployment.be  This test is not yet approved or cleared by the [place] FDA and has been authorized for detection and/or diagnosis of SARS-CoV-2 by FDA under an Emergency Use Authorization (EUA). This EUA will remain in effect (meaning this test can be used) for the duration of the COVID-19 declaration under Section 564(b)(1) of the Act, 21 U.S.C. section 360bbb-3(b)(1), unless the authorization is terminated or revoked.  Performed at [facility], [address], [place], [place] [address]   Urine Culture     Status: Abnormal   Collection Time: 06/15/20  9:03 AM   Specimen: Urine, Catheterized  Result Value Ref Range  Status   Specimen Description   Final    URINE, CATHETERIZED Performed at [facility], [address], [place], [place] [address]    Special Requests   Final    NONE Performed at [facility], [address] [place],  [address]  Culture >=100,000 COLONIES/mL ESCHERICHIA COLI (A)  Final   Report Status 06/17/2020 FINAL  Final   Organism ID, Bacteria ESCHERICHIA COLI (A)  Final      Susceptibility   Escherichia coli - MIC*    AMPICILLIN 4 SENSITIVE Sensitive     CEFAZOLIN <=4 SENSITIVE Sensitive     CEFEPIME <=0.12 SENSITIVE Sensitive     CEFTRIAXONE <=0.25 SENSITIVE Sensitive     CIPROFLOXACIN <=0.25 SENSITIVE Sensitive     GENTAMICIN <=1 SENSITIVE Sensitive     IMIPENEM <=0.25 SENSITIVE Sensitive     NITROFURANTOIN <=16 SENSITIVE Sensitive     TRIMETH/SULFA <=20 SENSITIVE Sensitive     AMPICILLIN/SULBACTAM <=2 SENSITIVE Sensitive     PIP/TAZO <=4 SENSITIVE Sensitive     * >=100,000 COLONIES/mL ESCHERICHIA COLI  Blood culture (routine x 2)     Status: None (Preliminary result)   Collection Time: 06/15/20 11:46 AM   Specimen: BLOOD  Result Value Ref Range Status   Specimen Description BLOOD BLOOD RIGHT HAND  Final   Special Requests   Final    BOTTLES DRAWN AEROBIC AND ANAEROBIC Blood Culture results may not be optimal due to an inadequate volume of blood received in culture bottles   Culture   Final    NO GROWTH 3 DAYS Performed at [facility], [address], [place], [place] [address]    Report Status PENDING  Incomplete  Blood culture (routine x 2)     Status: None (Preliminary result)   Collection Time: 06/15/20 12:07 PM   Specimen: BLOOD  Result Value Ref Range Status   Specimen Description BLOOD BLOOD LEFT FOREARM  Final   Special Requests   Final    BOTTLES DRAWN AEROBIC AND ANAEROBIC Blood Culture results may not be optimal due to an inadequate volume of blood received in culture bottles   Culture   Final    NO GROWTH 3  DAYS Performed at [facility], [address], [place],  [address]    Report Status PENDING  Incomplete     Time coordinating discharge in minutes: 65  SIGNED:   Debbe Odea, MD  [facility] 06/18/2020, 9:45 AM

## 2020-06-18 NOTE — Progress Notes (Signed)
PHARMACY NOTE:  ANTIMICROBIAL RENAL DOSAGE ADJUSTMENT  Current antimicrobial regimen includes a mismatch between antimicrobial dosage and estimated renal function.  As per policy approved by the Pharmacy & Therapeutics and Medical Executive Committees, the antimicrobial dosage will be adjusted accordingly.  Current antimicrobial dosage:  Keflex 500mg  PO TID  Indication: UTI  Renal Function: Renal fxn recovering from AKI  Estimated Creatinine Clearance: 39.3 mL/min (by C-G formula based on SCr of 0.93 mg/dL).     Antimicrobial dosage has been changed to:  Keflex 500mg  PO QID  Additional comments: DOT has been set to complete total of 7 days ending on Monday (3/21) per d/w MD Rizwan.  Thank you for allowing pharmacy to be a part of this patient's care.  Lorna Dibble, [facility] 06/18/2020 8:19 AM

## 2020-06-18 NOTE — Care Management Important Message (Signed)
Important Message  Patient Details  Name: Virginia Clements MRN: [id number] Date of Birth: 17-Jul-1929   Medicare Important Message Given:  Yes     Dannette Barbara 06/18/2020, 12:32 PM

## 2020-06-19 LAB — BLOOD CULTURE ID PANEL (REFLEXED) - BCID2

## 2020-06-20 LAB — CULTURE, BLOOD (ROUTINE X 2): Culture: NO GROWTH

## 2020-06-21 LAB — CULTURE, BLOOD (ROUTINE X 2)

## 2020-06-21 NOTE — Progress Notes (Signed)
PHARMACY - PHYSICIAN COMMUNICATION CRITICAL VALUE ALERT - BLOOD CULTURE IDENTIFICATION (BCID)  Virginia Clements is an 87 y.o. female who presented to [facility] on 06/15/2020 with a chief complaint of weakness and dysuria  Assessment:  Urine culture Pan-susceptible E. Coli received Ceftriaxone and cefazolin IV then PO cephalexin as inpatient. Discharged on cephalexin 500mg PO QID 06/18/2020 for a total antibiotic duration of 7 days.  Blood culture came back with GNR, BCID E Coli on 06/19/2020.  Name of physician (or Provider) Contacted: Dr. Wynelle Cleveland  Current antibiotics: Cephalexin at discharge  Changes to prescribed antibiotics recommended:  None - patient discharged on appropriate antibiotics, Duration acceptable for uncomplicated GNR bacteremia.    Results for orders placed or performed during the hospital encounter of 06/15/20  Blood Culture ID Panel (Reflexed) (Collected: 06/15/2020 12:07 PM)  Result Value Ref Range   Enterococcus faecalis NOT DETECTED NOT DETECTED   Enterococcus Faecium NOT DETECTED NOT DETECTED   Listeria monocytogenes NOT DETECTED NOT DETECTED   Staphylococcus species NOT DETECTED NOT DETECTED   Staphylococcus aureus (BCID) NOT DETECTED NOT DETECTED   Staphylococcus epidermidis NOT DETECTED NOT DETECTED   Staphylococcus lugdunensis NOT DETECTED NOT DETECTED   Streptococcus species NOT DETECTED NOT DETECTED   Streptococcus agalactiae NOT DETECTED NOT DETECTED   Streptococcus pneumoniae NOT DETECTED NOT DETECTED   Streptococcus pyogenes NOT DETECTED NOT DETECTED   A.calcoaceticus-baumannii NOT DETECTED NOT DETECTED   Bacteroides fragilis NOT DETECTED NOT DETECTED   Enterobacterales DETECTED (A) NOT DETECTED   Enterobacter cloacae complex NOT DETECTED NOT DETECTED   Escherichia coli DETECTED (A) NOT DETECTED   Klebsiella aerogenes NOT DETECTED NOT DETECTED   Klebsiella oxytoca NOT DETECTED NOT DETECTED   Klebsiella pneumoniae NOT DETECTED NOT DETECTED    Proteus species NOT DETECTED NOT DETECTED   Salmonella species NOT DETECTED NOT DETECTED   Serratia marcescens NOT DETECTED NOT DETECTED   Haemophilus influenzae NOT DETECTED NOT DETECTED   Neisseria meningitidis NOT DETECTED NOT DETECTED   Pseudomonas aeruginosa NOT DETECTED NOT DETECTED   Stenotrophomonas maltophilia NOT DETECTED NOT DETECTED   Candida albicans NOT DETECTED NOT DETECTED   Candida auris NOT DETECTED NOT DETECTED   Candida glabrata NOT DETECTED NOT DETECTED   Candida krusei NOT DETECTED NOT DETECTED   Candida parapsilosis NOT DETECTED NOT DETECTED   Candida tropicalis NOT DETECTED NOT DETECTED   Cryptococcus neoformans/gattii NOT DETECTED NOT DETECTED   CTX-M ESBL NOT DETECTED NOT DETECTED   Carbapenem resistance IMP NOT DETECTED NOT DETECTED   Carbapenem resistance KPC NOT DETECTED NOT DETECTED   Carbapenem resistance NDM NOT DETECTED NOT DETECTED   Carbapenem resist OXA 48 LIKE NOT DETECTED NOT DETECTED   Carbapenem resistance VIM NOT DETECTED NOT DETECTED    Doreene Eland, PharmD, BCPS.   Work Cell: [phone] 06/21/2020 8:29 AM

## 2020-08-27 ENCOUNTER — Encounter: Payer: Self-pay | Admitting: Emergency Medicine

## 2020-08-27 ENCOUNTER — Inpatient Hospital Stay
Admission: EM | Admit: 2020-08-27 | Discharge: 2020-08-29 | DRG: 682 | Disposition: A | Discharge status: Skilled Nursing Facility | Payer: Medicare Other | Attending: Internal Medicine | Admitting: Internal Medicine

## 2020-08-27 ENCOUNTER — Emergency Department: Payer: Medicare Other

## 2020-08-27 ENCOUNTER — Other Ambulatory Visit: Payer: Self-pay

## 2020-08-27 DIAGNOSIS — Z66 Do not resuscitate: Secondary | ICD-10-CM | POA: Diagnosis present

## 2020-08-27 DIAGNOSIS — Z888 Allergy status to other drugs, medicaments and biological substances status: Secondary | ICD-10-CM

## 2020-08-27 DIAGNOSIS — Z923 Personal history of irradiation: Secondary | ICD-10-CM

## 2020-08-27 DIAGNOSIS — R1084 Generalized abdominal pain: Secondary | ICD-10-CM

## 2020-08-27 DIAGNOSIS — U071 COVID-19: Secondary | ICD-10-CM | POA: Diagnosis present

## 2020-08-27 DIAGNOSIS — I10 Essential (primary) hypertension: Secondary | ICD-10-CM | POA: Diagnosis present

## 2020-08-27 DIAGNOSIS — N179 Acute kidney failure, unspecified: Principal | ICD-10-CM | POA: Diagnosis present

## 2020-08-27 DIAGNOSIS — E861 Hypovolemia: Secondary | ICD-10-CM | POA: Diagnosis present

## 2020-08-27 DIAGNOSIS — Z8249 Family history of ischemic heart disease and other diseases of the circulatory system: Secondary | ICD-10-CM

## 2020-08-27 DIAGNOSIS — Z885 Allergy status to narcotic agent status: Secondary | ICD-10-CM

## 2020-08-27 DIAGNOSIS — Z87891 Personal history of nicotine dependence: Secondary | ICD-10-CM

## 2020-08-27 DIAGNOSIS — E875 Hyperkalemia: Secondary | ICD-10-CM

## 2020-08-27 DIAGNOSIS — B9620 Unspecified Escherichia coli [E. coli] as the cause of diseases classified elsewhere: Secondary | ICD-10-CM | POA: Diagnosis present

## 2020-08-27 DIAGNOSIS — Z87440 Personal history of urinary (tract) infections: Secondary | ICD-10-CM

## 2020-08-27 DIAGNOSIS — B9689 Other specified bacterial agents as the cause of diseases classified elsewhere: Secondary | ICD-10-CM

## 2020-08-27 DIAGNOSIS — M6281 Muscle weakness (generalized): Secondary | ICD-10-CM | POA: Diagnosis present

## 2020-08-27 DIAGNOSIS — K5641 Fecal impaction: Secondary | ICD-10-CM

## 2020-08-27 DIAGNOSIS — Z993 Dependence on wheelchair: Secondary | ICD-10-CM

## 2020-08-27 DIAGNOSIS — Z79899 Other long term (current) drug therapy: Secondary | ICD-10-CM

## 2020-08-27 DIAGNOSIS — Z853 Personal history of malignant neoplasm of breast: Secondary | ICD-10-CM

## 2020-08-27 DIAGNOSIS — N390 Urinary tract infection, site not specified: Secondary | ICD-10-CM

## 2020-08-27 DIAGNOSIS — E871 Hypo-osmolality and hyponatremia: Secondary | ICD-10-CM

## 2020-08-27 LAB — URINALYSIS, COMPLETE (UACMP) WITH MICROSCOPIC
Bilirubin Urine: NEGATIVE
Glucose, UA: NEGATIVE mg/dL
Ketones, ur: NEGATIVE mg/dL
Nitrite: NEGATIVE
Protein, ur: 100 mg/dL — AB
RBC / HPF: >50 RBC/hpf — ABNORMAL HIGH (ref 0–5)
Specific Gravity, Urine: 1.005 (ref 1.005–1.030)
WBC, UA: >50 WBC/hpf — ABNORMAL HIGH (ref 0–5)
pH: 5 (ref 5.0–8.0)

## 2020-08-27 LAB — OSMOLALITY, URINE: Osmolality, Ur: 212 mOsm/kg — ABNORMAL LOW (ref 300–900)

## 2020-08-27 LAB — COMPREHENSIVE METABOLIC PANEL
ALT: 11 U/L (ref 0–44)
AST: 15 U/L (ref 15–41)
Albumin: 3.5 g/dL (ref 3.5–5.0)
Alkaline Phosphatase: 113 U/L (ref 38–126)
Anion gap: 9 (ref 5–15)
BUN: 59 mg/dL — ABNORMAL HIGH (ref 8–23)
CO2: 19 mmol/L — ABNORMAL LOW (ref 22–32)
Calcium: 10.2 mg/dL (ref 8.9–10.3)
Chloride: 98 mmol/L (ref 98–111)
Creatinine, Ser: 2.33 mg/dL — ABNORMAL HIGH (ref 0.44–1.00)
GFR, Estimated: 20 mL/min — ABNORMAL LOW (ref >60)
Glucose, Bld: 138 mg/dL — ABNORMAL HIGH (ref 70–99)
Potassium: 5.3 mmol/L — ABNORMAL HIGH (ref 3.5–5.1)
Sodium: 126 mmol/L — ABNORMAL LOW (ref 135–145)
Total Bilirubin: 0.8 mg/dL (ref 0.3–1.2)
Total Protein: 7.3 g/dL (ref 6.5–8.1)

## 2020-08-27 LAB — CBC
HCT: 35.5 % — ABNORMAL LOW (ref 36.0–46.0)
Hemoglobin: 12.3 g/dL (ref 12.0–15.0)
MCH: 29.4 pg (ref 26.0–34.0)
MCHC: 34.6 g/dL (ref 30.0–36.0)
MCV: 84.7 fL (ref 80.0–100.0)
Platelets: 396 10*3/uL (ref 150–400)
RBC: 4.19 MIL/uL (ref 3.87–5.11)
RDW: 14.2 % (ref 11.5–15.5)
WBC: 11 10*3/uL — ABNORMAL HIGH (ref 4.0–10.5)
nRBC: 0 % (ref 0.0–0.2)

## 2020-08-27 LAB — LIPASE, BLOOD: Lipase: 49 U/L (ref 11–51)

## 2020-08-27 LAB — SODIUM, URINE, RANDOM: Sodium, Ur: 29 mmol/L

## 2020-08-27 LAB — TROPONIN I (HIGH SENSITIVITY): Troponin I (High Sensitivity): 12 ng/L (ref <18)

## 2020-08-27 MED ORDER — POLYETHYLENE GLYCOL 3350 17 G PO PACK
17.0000 g | PACK | Freq: Every day | ORAL | Status: DC
  Administered 2020-08-28: 17 g via ORAL
  Filled 2020-08-27: qty 1

## 2020-08-27 MED ORDER — SODIUM CHLORIDE 0.9 % IV SOLN: Freq: Once | INTRAVENOUS | Status: AC

## 2020-08-27 MED ORDER — HYDROCODONE-ACETAMINOPHEN 5-325 MG PO TABS: 1.0000 | ORAL_TABLET | ORAL | Status: DC | PRN

## 2020-08-27 MED ORDER — ENOXAPARIN SODIUM 30 MG/0.3ML IJ SOSY
30.0000 mg | PREFILLED_SYRINGE | INTRAMUSCULAR | Status: DC
  Administered 2020-08-28 (×2): 30 mg via SUBCUTANEOUS
  Filled 2020-08-27 (×2): qty 0.3

## 2020-08-27 MED ORDER — ENOXAPARIN SODIUM 40 MG/0.4ML IJ SOSY: 40.0000 mg | PREFILLED_SYRINGE | INTRAMUSCULAR | Status: DC

## 2020-08-27 MED ORDER — SODIUM CHLORIDE 0.9 % IV BOLUS
1000.0000 mL | Freq: Once | INTRAVENOUS | Status: AC
  Administered 2020-08-27: 1000 mL via INTRAVENOUS

## 2020-08-27 MED ORDER — ONDANSETRON HCL 4 MG/2ML IJ SOLN: 4.0000 mg | Freq: Four times a day (QID) | INTRAMUSCULAR | Status: DC | PRN

## 2020-08-27 MED ORDER — ACETAMINOPHEN 325 MG PO TABS
650.0000 mg | ORAL_TABLET | Freq: Four times a day (QID) | ORAL | Status: DC | PRN
  Administered 2020-08-28: 01:00:00 650 mg via ORAL
  Filled 2020-08-27: qty 2

## 2020-08-27 MED ORDER — MILK AND MOLASSES ENEMA
1.0000 | Freq: Once | RECTAL | Status: DC
  Filled 2020-08-27: qty 240

## 2020-08-27 MED ORDER — FLEET ENEMA 7-19 GM/118ML RE ENEM: 1.0000 | ENEMA | Freq: Once | RECTAL | Status: DC

## 2020-08-27 MED ORDER — ACETAMINOPHEN 650 MG RE SUPP: 650.0000 mg | Freq: Four times a day (QID) | RECTAL | Status: DC | PRN

## 2020-08-27 MED ORDER — ONDANSETRON HCL 4 MG PO TABS: 4.0000 mg | ORAL_TABLET | Freq: Four times a day (QID) | ORAL | Status: DC | PRN

## 2020-08-27 NOTE — ED Provider Notes (Signed)
[facility] Provider Note  ____________________________________________   Event Date/Time   First MD Initiated Contact with Patient 08/27/20 2008     (approximate)  I have reviewed the triage vital signs and the nursing notes.   HISTORY  Chief Complaint No chief complaint on file.    HPI [name] is a 87 y.o. female with history of  hypertension, breast cancer, prior UTIs, here with abdominal pain and poor appetite.  The patient states that for the last 3 to 4 days, she has had intermittent but progressively worsening epigastric abdominal pain.  She has had associated cramping pain with nausea and vomiting.  She has had decreased appetite and feels like she has not wanted to eat or drink anything over the last several days.  She has had decreased associated urine output.  No constipation or diarrhea.  No known fevers.  She has had some chills.  She states that she had labs drawn at her facility and was told to come here for evaluation of kidney injury.  She does admit that she has been urinating less than usual.  She also reports she was recently treated for UTI, and has some ongoing mild dysuria.       Past Medical History:  Diagnosis Date  . Breast cancer ([place])   . Hypertension   . Hypoglycemia   . Radiation 2009    Patient Active Problem List   Diagnosis Date Noted  . Hyponatremia 08/27/2020  . Hyperkalemia 08/27/2020  . Fecal impaction ([place]) 08/27/2020  . AKI (acute kidney injury) ([place]) 06/18/2020  . Vomiting 06/18/2020  . Wheelchair dependent 06/18/2020  . Benign essential HTN 06/18/2020  . Wheelchair bound 06/16/2020  . Generalized weakness 06/16/2020  . Sepsis secondary to UTI ([place]) 06/16/2020  . UTI (urinary tract infection) 06/15/2020  . Hypokalemia 05/31/2018    Past Surgical History:  Procedure Laterality Date  . BREAST BIOPSY Right 2009    Prior to Admission medications   Medication Sig  Start Date End Date Taking? Authorizing Provider  acetaminophen (TYLENOL) 325 MG tablet Take 2 tablets (650 mg total) by mouth every 6 (six) hours as needed for mild pain (or Fever >/= 101). 06/04/18   [name], MD  amLODipine (NORVASC) 10 MG tablet Take 1 tablet (10 mg total) by mouth daily. 06/05/18   [name], MD  enalapril (VASOTEC) 10 MG tablet Take 10 mg by mouth daily.    [provider]  metoprolol tartrate (LOPRESSOR) 25 MG tablet Take 1 tablet (25 mg total) by mouth 2 (two) times daily. 06/04/18   [name], MD  mirtazapine (REMERON) 7.5 MG tablet Take 7.5 mg by mouth at bedtime.    [provider]  naproxen (NAPROSYN) 375 MG tablet Take 375 mg by mouth 2 (two) times daily with a meal.    [provider]  timolol (TIMOPTIC-XR) 0.5 % ophthalmic gel-forming Place 1 drop into both eyes at bedtime. 07/06/14   [provider]    Allergies Alendronate and Codeine  Family History  Problem Relation Age of Onset  . Hypertension Mother     Social History Social History   Tobacco Use  . Smoking status: Former Research scientist (life sciences)  . Smokeless tobacco: Never Used  Vaping Use  . Vaping Use: Never used  Substance Use Topics  . Alcohol use: Not Currently  . Drug use: Never    Review of Systems  Review of Systems  Constitutional: Positive for fatigue. Negative for fever.  HENT: Negative for congestion and sore throat.   Eyes: Negative for visual disturbance.  Respiratory: Negative for cough and shortness of breath.   Cardiovascular: Negative for chest pain.  Gastrointestinal: Positive for abdominal distention, nausea and vomiting. Negative for abdominal pain and diarrhea.  Genitourinary: Positive for dysuria. Negative for flank pain.  Musculoskeletal: Negative for back pain and neck pain.  Skin: Negative for rash and wound.  Neurological: Positive for weakness.  All other systems reviewed and are negative.     ____________________________________________  PHYSICAL EXAM:      VITAL SIGNS: ED Triage Vitals  Enc Vitals Group     BP 08/27/20 1659 (!) 106/58     Pulse Rate 08/27/20 1659 85     Resp 08/27/20 1659 18     Temp 08/27/20 1659 98.4 F (36.9 C)     Temp Source 08/27/20 1659 Oral     SpO2 08/27/20 1659 98 %     Weight 08/27/20 1700 163 lb 2.3 oz (74 kg)     Height 08/27/20 1700 5\' 2"  (1.575 m)     Head Circumference --      Peak Flow --      Pain Score 08/27/20 1700 0     Pain Loc --      Pain Edu? --      Excl. in [place]? --      Physical Exam Vitals and nursing note reviewed.  Constitutional:      General: She is not in acute distress.    Appearance: She is well-developed.  HENT:     Head: Normocephalic and atraumatic.     Mouth/Throat:     Mouth: Mucous membranes are dry.  Eyes:     Conjunctiva/sclera: Conjunctivae normal.  Cardiovascular:     Rate and Rhythm: Normal rate and regular rhythm.     Heart sounds: Normal heart sounds. No murmur heard. No friction rub.  Pulmonary:     Effort: Pulmonary effort is normal. No respiratory distress.     Breath sounds: Normal breath sounds. No wheezing or rales.  Abdominal:     General: Abdomen is flat. There is no distension.     Palpations: Abdomen is soft.     Tenderness: There is abdominal tenderness (Moderate, epigastric).  Musculoskeletal:     Cervical back: Neck supple.  Skin:    General: Skin is warm.     Capillary Refill: Capillary refill takes less than 2 seconds.  Neurological:     Mental Status: She is alert and oriented to person, place, and time.     Motor: No abnormal muscle tone.       ____________________________________________   LABS (all labs ordered are listed, but only abnormal results are displayed)  Labs Reviewed  RESP PANEL BY RT-PCR (FLU A&B, COVID) ARPGX2 - Abnormal; Notable for the following components:      Result Value   SARS Coronavirus 2 by RT PCR POSITIVE (*)    All other  components within normal limits  COMPREHENSIVE METABOLIC PANEL - Abnormal; Notable for the following components:   Sodium 126 (*)    Potassium 5.3 (*)    CO2 19 (*)    Glucose, Bld 138 (*)    BUN 59 (*)    Creatinine, Ser 2.33 (*)    GFR, Estimated 20 (*)    All other components within normal limits  CBC - Abnormal; Notable for the following components:   WBC 11.0 (*)    HCT 35.5 (*)  All other components within normal limits  URINALYSIS, COMPLETE (UACMP) WITH MICROSCOPIC - Abnormal; Notable for the following components:   Color, Urine YELLOW (*)    APPearance TURBID (*)    Hgb urine dipstick LARGE (*)    Protein, ur 100 (*)    Leukocytes,Ua MODERATE (*)    RBC / HPF >50 (*)    WBC, UA >50 (*)    Bacteria, UA MANY (*)    All other components within normal limits  OSMOLALITY, URINE - Abnormal; Notable for the following components:   Osmolality, Ur 212 (*)    All other components within normal limits  URINE CULTURE  LIPASE, BLOOD  OSMOLALITY  SODIUM, URINE, RANDOM  BASIC METABOLIC PANEL  CBC  TROPONIN I (HIGH SENSITIVITY)  TROPONIN I (HIGH SENSITIVITY)    ____________________________________________  EKG: Normal sinus rhythm, ventricular rate 86.  PR 170, QRS 74, QTc 402.  No acute ST elevations or depressions. ________________________________________  RADIOLOGY All imaging, including plain films, CT scans, and ultrasounds, independently reviewed by me, and interpretations confirmed via formal radiology reads.  ED MD interpretation:   CT A/P: Cystitis, prominent rectosigmoid distension, query fecal impaction  Official radiology report(s): CT ABDOMEN PELVIS WO CONTRAST  Result Date: 08/27/2020 CLINICAL DATA:  Abdominal pain and distension. EXAM: CT ABDOMEN AND PELVIS WITHOUT CONTRAST TECHNIQUE: Multidetector CT imaging of the abdomen and pelvis was performed following the standard protocol without IV contrast. COMPARISON:  None. FINDINGS: Lower chest: No acute  airspace disease or pleural effusion. Question of mild subpleural reticulation in the lingula and right middle lobe. Aortic atherosclerosis and coronary artery calcifications. Hepatobiliary: No focal liver abnormality on this noncontrast exam. Gallbladder physiologically distended, no calcified stone. No biliary dilatation. Pancreas: No ductal dilatation or inflammation. Spleen: Normal in size without focal abnormality. Adrenals/Urinary Tract: 19 mm low-density right adrenal nodule is consistent with adenoma. Normal left adrenal gland. Slight prominence of the right renal pelvis without frank hydronephrosis. No significant perinephric edema. No left hydronephrosis. No renal or ureteral stones. Partially distended urinary bladder. There is diffuse perivesicular fat stranding and wall thickening about the bladder dome. Stomach/Bowel: Prominent rectosigmoid distention with stool, rectal dimension of 10 cm. There is mild rectal wall thickening and perirectal fat stranding. No bowel pneumatosis. Moderate volume of stool throughout the remainder of the colon. Two enteric sutures are noted in the sigmoid. Scattered colonic diverticulosis without focal diverticulitis. Appendix is not visualized. No appendicitis. There is no small bowel obstruction or inflammation. Decompressed stomach. Vascular/Lymphatic: Aortic atherosclerosis. No aortic aneurysm. No portal venous or mesenteric gas. Small retroperitoneal lymph nodes are not enlarged by size criteria. No enlarged lymph nodes in the abdomen or pelvis. Reproductive: Uterus surgically absent. Quiescent ovaries. No adnexal mass. Other: No free air, free fluid, or intra-abdominal fluid collection. Postsurgical change of the anterior abdominal wall. Musculoskeletal: Bones diffusely under mineralized. There is anterolisthesis of L5 on S1 and L4 on L5 without visualized pars defects. Mild inferior endplate compression deformities of T12 and L1, mild superior compression deformity  of L5. Bones are diffusely under mineralized. Surgical hardware in the right proximal femur. IMPRESSION: 1. Prominent rectosigmoid distention with stool, rectal dimension of 10 cm. Mild rectal wall thickening and perirectal fat stranding, suspicious for fecal impaction. 2. Diffuse perivesicular fat stranding and wall thickening about the bladder dome, suspicious for cystitis. Recommend correlation with urinalysis. 3. Colonic diverticulosis without diverticulitis. 4. Right adrenal adenoma. 5. Mild T12, L1 and L5 compression deformities, age indeterminate, but likely chronic. Aortic Atherosclerosis (ICD10-I70.0).  Electronically Signed   By: Keith Rake M.D.   On: 08/27/2020 21:02    ____________________________________________  PROCEDURES   Procedure(s) performed (including Critical Care):  .1-3 Lead EKG Interpretation Performed by: Duffy Bruce, MD Authorized by: Duffy Bruce, MD     Interpretation: abnormal     ECG rate:  100s   ECG rate assessment: tachycardic     Rhythm: sinus tachycardia     Ectopy: none     Conduction: normal   Comments:     Indication: weakness, AKI    ____________________________________________  INITIAL IMPRESSION / MDM / ASSESSMENT AND PLAN / ED COURSE  As part of my medical decision making, I reviewed the following data within the [facility] notes reviewed and incorporated, Old chart reviewed, Notes from prior ED visits, and  Controlled Substance Database       *Virginia Clements was evaluated in Emergency Department on 08/28/2020 for the symptoms described in the history of present illness. She was evaluated in the context of the global COVID-19 pandemic, which necessitated consideration that the patient might be at risk for infection with the SARS-CoV-2 virus that causes COVID-19. Institutional protocols and algorithms that pertain to the evaluation of patients at risk for COVID-19 are in a state of rapid change  based on information released by regulatory bodies including the CDC and federal and state organizations. These policies and algorithms were followed during the patient's care in the ED.  Some ED evaluations and interventions may be delayed as a result of limited staffing during the pandemic.*     Medical Decision Making:  87 yo F here with abd pain, vomiting, poor PO intake. Labs reviewed, remarkable for significant AKI with likely hypovolemic hyponatremia. Na 126, Cr 2.33, BUN 59. WBC 11.0, likely reactive. Pt reports she has been treated for UTI recently, and Macrobid noted on MAR. CT A/P obtained, is concerning for UTI and possible impaction though she had a BM in ED with formed stool. Suspect AKI 2/2 dehydration, with UTI. Will start fluids,f/u UA, and admit. Pt in agreement.   ____________________________________________  FINAL CLINICAL IMPRESSION(S) / ED DIAGNOSES  Final diagnoses:  AKI (acute kidney injury) ([place])  Hyponatremia  Generalized abdominal pain     MEDICATIONS GIVEN DURING THIS VISIT:  Medications  acetaminophen (TYLENOL) tablet 650 mg (650 mg Oral Given 08/28/20 0059)    Or  acetaminophen (TYLENOL) suppository 650 mg ( Rectal See Alternative 08/28/20 0059)  HYDROcodone-acetaminophen (NORCO/VICODIN) 5-325 MG per tablet 1-2 tablet (has no administration in time range)  ondansetron (ZOFRAN) tablet 4 mg (has no administration in time range)    Or  ondansetron (ZOFRAN) injection 4 mg (has no administration in time range)  enoxaparin (LOVENOX) injection 30 mg (30 mg Subcutaneous Given 08/28/20 0101)  polyethylene glycol (MIRALAX / GLYCOLAX) packet 17 g (has no administration in time range)  sodium chloride 0.9 % bolus 1,000 mL (0 mLs Intravenous Stopped 08/27/20 2342)  0.9 %  sodium chloride infusion ( Intravenous New Bag/Given 08/28/20 0051)     ED Discharge Orders    None       Note:  This document was prepared using Dragon voice recognition software and may include  unintentional dictation errors.   Duffy Bruce, MD 08/28/20 0130

## 2020-08-27 NOTE — ED Triage Notes (Signed)
Pt comes into the ED via [facility] from [facility] c/o abnormal labs of her renal functions.  Pt presents in NAD with even and unlabored respirations and denies pain.  Creatinine 2.5, and BUN 57.9. Pt has been unresponsive to all kidney treatments so far.

## 2020-08-27 NOTE — H&P (Signed)
History and Physical    Virginia Clements GYJ:[id number] DOB: 1929/08/16 DOA: 08/27/2020  PCP: Baxter Hire, MD   Patient coming from: Home  I have personally briefly reviewed patient's old medical records in [place]  Chief Complaint: Abnormal kidney labs, vomiting, abdominal discomfort x3 days  HPI: Virginia Clements is a 87 y.o. female with medical history significant for HTN, wheelchair-bound at baseline, hospitalized in March for E. coli UTI, currently on Macrobid as outpatient for UTI who was sent to the ER for evaluation of abnormal renal function tests.  Patient was recently symptomatic for nausea and vomiting abdominal discomfort and she was found to have a UTI.  She denied fever or chills, cough or shortness of breath, chest pain or change in bowel habits. ED course: On arrival, she had a soft blood pressure of 106/58 with pulse 85, afebrile, O2 sat 98% on room air.  Blood work significant for creatinine of 2.33, up from a baseline of 0.93 a couple months prior with bicarb of 19 and potassium of 5.3.  Sodium 126.  WBC 11.  Lipase normal troponin normal at 12. EKG, personally reviewed and interpreted: NSR at 91 with no acute ST-T wave changes Imaging: CT abdomen and pelvis with without contrast significant for findings suspicious for fecal impaction as well as findings suspicious for cystitis, please see details below under imaging  Patient was ordered a milk and molasses enema from the emergency room and urinalysis was sent off.  IV fluid bolus was administered.  Hospitalist consulted for admission.  Review of Systems: As per HPI otherwise all other systems on review of systems negative.    Past Medical History:  Diagnosis Date  . Breast cancer ([place])   . Hypertension   . Hypoglycemia   . Radiation 2009    Past Surgical History:  Procedure Laterality Date  . BREAST BIOPSY Right 2009     reports that she has quit smoking. She has never used  smokeless tobacco. She reports previous alcohol use. She reports that she does not use drugs.  Allergies  Allergen Reactions  . Alendronate Other (See Comments)    Nervousness and shakiness per pt  . Codeine     dizziness    Family History  Problem Relation Age of Onset  . Hypertension Mother       Prior to Admission medications   Medication Sig Start Date End Date Taking? Authorizing Provider  acetaminophen (TYLENOL) 325 MG tablet Take 2 tablets (650 mg total) by mouth every 6 (six) hours as needed for mild pain (or Fever >/= 101). 06/04/18   [name], MD  amLODipine (NORVASC) 10 MG tablet Take 1 tablet (10 mg total) by mouth daily. 06/05/18   [name], MD  enalapril (VASOTEC) 10 MG tablet Take 10 mg by mouth daily.    [provider]  metoprolol tartrate (LOPRESSOR) 25 MG tablet Take 1 tablet (25 mg total) by mouth 2 (two) times daily. 06/04/18   [name], MD  mirtazapine (REMERON) 7.5 MG tablet Take 7.5 mg by mouth at bedtime.    [provider]  naproxen (NAPROSYN) 375 MG tablet Take 375 mg by mouth 2 (two) times daily with a meal.    [provider]  timolol (TIMOPTIC-XR) 0.5 % ophthalmic gel-forming Place 1 drop into both eyes at bedtime. 07/06/14   [provider]    Physical Exam: Vitals:   08/27/20 1659 08/27/20 1700 08/27/20 2200  BP: (!) 106/58  125/66  Pulse: 85  94  Resp: 18  (!) 24  Temp: 98.4 F (36.9 C)    TempSrc: Oral    SpO2: 98%  97%  Weight:  74 kg   Height:  5\' 2"  (1.575 m)      Vitals:   08/27/20 1659 08/27/20 1700 08/27/20 2200  BP: (!) 106/58  125/66  Pulse: 85  94  Resp: 18  (!) 24  Temp: 98.4 F (36.9 C)    TempSrc: Oral    SpO2: 98%  97%  Weight:  74 kg   Height:  5\' 2"  (1.575 m)       Constitutional: Alert and oriented x 3 . Not in any apparent distress HEENT:      Head: Normocephalic and atraumatic.         Eyes: PERLA, EOMI, Conjunctivae are normal. Sclera  is non-icteric.       Mouth/Throat: Mucous membranes are moist.       Neck: Supple with no signs of meningismus. Cardiovascular: Regular rate and rhythm. No murmurs, gallops, or rubs. 2+ symmetrical distal pulses are present . No JVD. No LE edema Respiratory: Respiratory effort normal .Lungs sounds clear bilaterally. No wheezes, crackles, or rhonchi.  Gastrointestinal: Soft, non tender, and non distended with positive bowel sounds.  Genitourinary: No CVA tenderness. Musculoskeletal: Nontender with normal range of motion in all extremities. No cyanosis, or erythema of extremities. Neurologic:  Face is symmetric. Moving all extremities. No gross focal neurologic deficits . Skin: Skin is warm, dry.  No rash or ulcers Psychiatric: Mood and affect are normal    Labs on Admission: I have personally reviewed following labs and imaging studies  CBC: Recent Labs  Lab 08/27/20 1704  WBC 11.0*  HGB 12.3  HCT 35.5*  MCV 84.7  PLT 409   Basic Metabolic Panel: Recent Labs  Lab 08/27/20 1704  NA 126*  K 5.3*  CL 98  CO2 19*  GLUCOSE 138*  BUN 59*  CREATININE 2.33*  CALCIUM 10.2   GFR: Estimated Creatinine Clearance: 15.7 mL/min (A) (by C-G formula based on SCr of 2.33 mg/dL (H)). Liver Function Tests: Recent Labs  Lab 08/27/20 1704  AST 15  ALT 11  ALKPHOS 113  BILITOT 0.8  PROT 7.3  ALBUMIN 3.5   Recent Labs  Lab 08/27/20 2120  LIPASE 49   No results for input(s): AMMONIA in the last 168 hours. Coagulation Profile: No results for input(s): INR, PROTIME in the last 168 hours. Cardiac Enzymes: No results for input(s): CKTOTAL, CKMB, CKMBINDEX, TROPONINI in the last 168 hours. BNP (last 3 results) No results for input(s): PROBNP in the last 8760 hours. HbA1C: No results for input(s): HGBA1C in the last 72 hours. CBG: No results for input(s): GLUCAP in the last 168 hours. Lipid Profile: No results for input(s): CHOL, HDL, LDLCALC, TRIG, CHOLHDL, LDLDIRECT in the last  72 hours. Thyroid Function Tests: No results for input(s): TSH, T4TOTAL, FREET4, T3FREE, THYROIDAB in the last 72 hours. Anemia Panel: No results for input(s): VITAMINB12, FOLATE, FERRITIN, TIBC, IRON, RETICCTPCT in the last 72 hours. Urine analysis:    Component Value Date/Time   COLORURINE ORANGE (A) 06/15/2020 0903   APPEARANCEUR TURBID (A) 06/15/2020 0903   APPEARANCEUR Clear 08/10/2011 0226   LABSPEC 1.015 06/15/2020 0903   LABSPEC 1.025 08/10/2011 0226   PHURINE 5.0 06/15/2020 0903   GLUCOSEU NEGATIVE 06/15/2020 0903   GLUCOSEU Negative 08/10/2011 0226   HGBUR MODERATE (A) 06/15/2020 0903   BILIRUBINUR NEGATIVE 06/15/2020 7353  BILIRUBINUR Negative 08/10/2011 0226   KETONESUR NEGATIVE 06/15/2020 0903   PROTEINUR 100 (A) 06/15/2020 0903   NITRITE POSITIVE (A) 06/15/2020 0903   LEUKOCYTESUR TRACE (A) 06/15/2020 0903   LEUKOCYTESUR Negative 08/10/2011 0226    Radiological Exams on Admission: CT ABDOMEN PELVIS WO CONTRAST  Result Date: 08/27/2020 CLINICAL DATA:  Abdominal pain and distension. EXAM: CT ABDOMEN AND PELVIS WITHOUT CONTRAST TECHNIQUE: Multidetector CT imaging of the abdomen and pelvis was performed following the standard protocol without IV contrast. COMPARISON:  None. FINDINGS: Lower chest: No acute airspace disease or pleural effusion. Question of mild subpleural reticulation in the lingula and right middle lobe. Aortic atherosclerosis and coronary artery calcifications. Hepatobiliary: No focal liver abnormality on this noncontrast exam. Gallbladder physiologically distended, no calcified stone. No biliary dilatation. Pancreas: No ductal dilatation or inflammation. Spleen: Normal in size without focal abnormality. Adrenals/Urinary Tract: 19 mm low-density right adrenal nodule is consistent with adenoma. Normal left adrenal gland. Slight prominence of the right renal pelvis without frank hydronephrosis. No significant perinephric edema. No left hydronephrosis. No renal  or ureteral stones. Partially distended urinary bladder. There is diffuse perivesicular fat stranding and wall thickening about the bladder dome. Stomach/Bowel: Prominent rectosigmoid distention with stool, rectal dimension of 10 cm. There is mild rectal wall thickening and perirectal fat stranding. No bowel pneumatosis. Moderate volume of stool throughout the remainder of the colon. Two enteric sutures are noted in the sigmoid. Scattered colonic diverticulosis without focal diverticulitis. Appendix is not visualized. No appendicitis. There is no small bowel obstruction or inflammation. Decompressed stomach. Vascular/Lymphatic: Aortic atherosclerosis. No aortic aneurysm. No portal venous or mesenteric gas. Small retroperitoneal lymph nodes are not enlarged by size criteria. No enlarged lymph nodes in the abdomen or pelvis. Reproductive: Uterus surgically absent. Quiescent ovaries. No adnexal mass. Other: No free air, free fluid, or intra-abdominal fluid collection. Postsurgical change of the anterior abdominal wall. Musculoskeletal: Bones diffusely under mineralized. There is anterolisthesis of L5 on S1 and L4 on L5 without visualized pars defects. Mild inferior endplate compression deformities of T12 and L1, mild superior compression deformity of L5. Bones are diffusely under mineralized. Surgical hardware in the right proximal femur. IMPRESSION: 1. Prominent rectosigmoid distention with stool, rectal dimension of 10 cm. Mild rectal wall thickening and perirectal fat stranding, suspicious for fecal impaction. 2. Diffuse perivesicular fat stranding and wall thickening about the bladder dome, suspicious for cystitis. Recommend correlation with urinalysis. 3. Colonic diverticulosis without diverticulitis. 4. Right adrenal adenoma. 5. Mild T12, L1 and L5 compression deformities, age indeterminate, but likely chronic. Aortic Atherosclerosis (ICD10-I70.0). Electronically Signed   By: Keith Rake M.D.   On:  08/27/2020 21:02     Assessment/Plan 87 year old female with history of HTN, wheelchair-bound at baseline, hospitalized in March for E. coli UTI, currently on Macrobid as outpatient for UTI presenting with abnormal creatinine, and  recently symptomatic for nausea and vomiting abdominal discomfort with decreased appetite.  Abdominal pain,  vomiting Recent UTI with acute cystitis on CT Fecal impaction on CT - Suspect related to UTI,  And likely also to fecal impaction seen on CT, though patient denies change in bowel habits.  Lipase normal - Patient recently on Macrobid - Follow urinalysis and urine culture prior to continuation of antibiotics - Received milk and molasses enema from the ED - Daily MiraLAX to maintain bowel regularity    Wheelchair bound - Increase nursing assistance with transfers    Benign essential HTN - Continue home amlodipine.  Hold enalapril on account of kidney  function    Hyponatremia - Likely hypovolemic due to poor oral intake and vomiting - Follow serum and urine osmolality ordered from the ED    DVT prophylaxis: Lovenox  Code Status: full code  Family Communication:  none  Disposition Plan: Back to previous home environment Consults called: none  Status: Observation    Athena Masse MD [facility]     08/27/2020, 11:12 PM

## 2020-08-28 DIAGNOSIS — E871 Hypo-osmolality and hyponatremia: Secondary | ICD-10-CM

## 2020-08-28 DIAGNOSIS — E875 Hyperkalemia: Secondary | ICD-10-CM | POA: Diagnosis present

## 2020-08-28 DIAGNOSIS — Z87891 Personal history of nicotine dependence: Secondary | ICD-10-CM | POA: Diagnosis not present

## 2020-08-28 DIAGNOSIS — M6281 Muscle weakness (generalized): Secondary | ICD-10-CM | POA: Diagnosis present

## 2020-08-28 DIAGNOSIS — N390 Urinary tract infection, site not specified: Secondary | ICD-10-CM | POA: Diagnosis present

## 2020-08-28 DIAGNOSIS — Z66 Do not resuscitate: Secondary | ICD-10-CM | POA: Diagnosis present

## 2020-08-28 DIAGNOSIS — N179 Acute kidney failure, unspecified: Secondary | ICD-10-CM | POA: Diagnosis present

## 2020-08-28 DIAGNOSIS — K5641 Fecal impaction: Secondary | ICD-10-CM | POA: Diagnosis present

## 2020-08-28 DIAGNOSIS — Z888 Allergy status to other drugs, medicaments and biological substances status: Secondary | ICD-10-CM | POA: Diagnosis not present

## 2020-08-28 DIAGNOSIS — Z8249 Family history of ischemic heart disease and other diseases of the circulatory system: Secondary | ICD-10-CM | POA: Diagnosis not present

## 2020-08-28 DIAGNOSIS — Z885 Allergy status to narcotic agent status: Secondary | ICD-10-CM | POA: Diagnosis not present

## 2020-08-28 DIAGNOSIS — I10 Essential (primary) hypertension: Secondary | ICD-10-CM | POA: Diagnosis present

## 2020-08-28 DIAGNOSIS — B9620 Unspecified Escherichia coli [E. coli] as the cause of diseases classified elsewhere: Secondary | ICD-10-CM | POA: Diagnosis present

## 2020-08-28 DIAGNOSIS — Z853 Personal history of malignant neoplasm of breast: Secondary | ICD-10-CM | POA: Diagnosis not present

## 2020-08-28 DIAGNOSIS — Z993 Dependence on wheelchair: Secondary | ICD-10-CM | POA: Diagnosis not present

## 2020-08-28 DIAGNOSIS — Z923 Personal history of irradiation: Secondary | ICD-10-CM | POA: Diagnosis not present

## 2020-08-28 DIAGNOSIS — Z87440 Personal history of urinary (tract) infections: Secondary | ICD-10-CM | POA: Diagnosis not present

## 2020-08-28 DIAGNOSIS — Z79899 Other long term (current) drug therapy: Secondary | ICD-10-CM | POA: Diagnosis not present

## 2020-08-28 DIAGNOSIS — N3001 Acute cystitis with hematuria: Secondary | ICD-10-CM

## 2020-08-28 DIAGNOSIS — U071 COVID-19: Secondary | ICD-10-CM | POA: Diagnosis present

## 2020-08-28 DIAGNOSIS — E861 Hypovolemia: Secondary | ICD-10-CM | POA: Diagnosis present

## 2020-08-28 LAB — CBC
HCT: 33.4 % — ABNORMAL LOW (ref 36.0–46.0)
Hemoglobin: 11.6 g/dL — ABNORMAL LOW (ref 12.0–15.0)
MCH: 29.1 pg (ref 26.0–34.0)
MCHC: 34.7 g/dL (ref 30.0–36.0)
MCV: 83.7 fL (ref 80.0–100.0)
Platelets: 374 10*3/uL (ref 150–400)
RBC: 3.99 MIL/uL (ref 3.87–5.11)
RDW: 14 % (ref 11.5–15.5)
WBC: 8.6 10*3/uL (ref 4.0–10.5)
nRBC: 0 % (ref 0.0–0.2)

## 2020-08-28 LAB — TROPONIN I (HIGH SENSITIVITY): Troponin I (High Sensitivity): 13 ng/L (ref <18)

## 2020-08-28 LAB — BASIC METABOLIC PANEL
Anion gap: 9 (ref 5–15)
BUN: 51 mg/dL — ABNORMAL HIGH (ref 8–23)
CO2: 18 mmol/L — ABNORMAL LOW (ref 22–32)
Calcium: 9.7 mg/dL (ref 8.9–10.3)
Chloride: 105 mmol/L (ref 98–111)
Creatinine, Ser: 1.8 mg/dL — ABNORMAL HIGH (ref 0.44–1.00)
GFR, Estimated: 27 mL/min — ABNORMAL LOW (ref >60)
Glucose, Bld: 92 mg/dL (ref 70–99)
Potassium: 4.6 mmol/L (ref 3.5–5.1)
Sodium: 132 mmol/L — ABNORMAL LOW (ref 135–145)

## 2020-08-28 LAB — OSMOLALITY: Osmolality: 288 mOsm/kg (ref 275–295)

## 2020-08-28 LAB — RESP PANEL BY RT-PCR (FLU A&B, COVID) ARPGX2
Influenza A by PCR: NEGATIVE
Influenza B by PCR: NEGATIVE
SARS Coronavirus 2 by RT PCR: POSITIVE — AB

## 2020-08-28 MED ORDER — OXYCODONE HCL 5 MG PO TABS: 5.0000 mg | ORAL_TABLET | ORAL | Status: DC | PRN

## 2020-08-28 MED ORDER — ACETAMINOPHEN 650 MG RE SUPP: 650.0000 mg | Freq: Four times a day (QID) | RECTAL | Status: DC | PRN

## 2020-08-28 MED ORDER — SODIUM CHLORIDE 0.9 % IV SOLN
1.0000 g | INTRAVENOUS | Status: DC
  Administered 2020-08-28 – 2020-08-29 (×2): 1 g via INTRAVENOUS
  Filled 2020-08-28 (×2): qty 10

## 2020-08-28 MED ORDER — SENNOSIDES-DOCUSATE SODIUM 8.6-50 MG PO TABS
1.0000 | ORAL_TABLET | Freq: Two times a day (BID) | ORAL | Status: DC
  Administered 2020-08-28: 08:00:00 1 via ORAL
  Filled 2020-08-28: qty 1

## 2020-08-28 MED ORDER — SODIUM CHLORIDE 0.9 % IV SOLN: INTRAVENOUS | Status: DC

## 2020-08-28 MED ORDER — ACETAMINOPHEN 325 MG PO TABS
650.0000 mg | ORAL_TABLET | ORAL | Status: DC | PRN
  Administered 2020-08-28 (×2): 650 mg via ORAL
  Filled 2020-08-28 (×2): qty 2

## 2020-08-28 NOTE — Progress Notes (Signed)
Pt refused enema. MD notified

## 2020-08-28 NOTE — Plan of Care (Signed)
  Problem: Education: Goal: Knowledge of risk factors and measures for prevention of condition will improve Outcome: Progressing   Problem: Coping: Goal: Psychosocial and spiritual needs will be supported Outcome: Progressing   Problem: Respiratory: Goal: Will maintain a patent airway Outcome: Progressing Goal: Complications related to the disease process, condition or treatment will be avoided or minimized Outcome: Progressing   

## 2020-08-28 NOTE — Progress Notes (Signed)
Progress Note    Virginia Clements   PPJ:[id number]  DOB: 22-Feb-1930  DOA: 08/27/2020     0  PCP: Baxter Hire, MD  CC: Vomiting, abdominal discomfort, abnormal labs  Hospital Course: Virginia Clements is a 87 y.o. female with medical history significant for HTN, wheelchair-bound at baseline, hospitalized in March for E. coli UTI, currently on Macrobid as outpatient for UTI who was sent to the ER for evaluation of abnormal renal function tests.  Patient was recently symptomatic for nausea and vomiting abdominal discomfort and she was found to have a UTI (has been on outpatient macrobid but still endorsing symptoms on admission of dysuria).  She denied fever or chills, cough or shortness of breath, chest pain or change in bowel habits. On arrival, she had a soft blood pressure of 106/58 with pulse 85, afebrile, O2 sat 98% on room air. Blood work significant for creatinine of 2.33, up from a baseline of 0.93 a couple months prior with bicarb of 19 and potassium of 5.3.  Sodium 126.  WBC 11.  Lipase normal troponin normal at 12.  CT abdomen and pelvis with without contrast significant for findings suspicious for fecal impaction as well as findings suspicious for cystitis.  Patient was ordered a milk and molasses enema from the emergency room and urinalysis was sent off.  IV fluid bolus was administered.  Interval History:  No events overnight.  Seen in her room resting comfortably this morning.  Endorsed some improvement in her constipation and was having a bowel movement in bed this morning.  Nausea/vomiting also has improved.  She still was endorsing ongoing urinary symptoms despite having been on Macrobid outpatient.  She states she typically does not have urinary symptoms at baseline.  We therefore decided to start her on IV antibiotics in addition to continuing fluids for her underlying renal dysfunction.  Patient amenable with plan.  ROS: Constitutional: negative for  chills and fevers, Respiratory: negative for cough, Cardiovascular: negative for chest pain and Gastrointestinal: negative for abdominal pain  Assessment & Plan:  UTI - still symptomatic despite macrobid outpt; UA consistent with infection as well - Will start on Rocephin and monitor clinical response as well as cultures.  Previously has grown pansensitive E. coli  Severe constipation - Seen on CT - Given milk & molasses enema on admission - Continue laxative treatment during hospitalization  AKI - baseline creatinine ~ 0.9 - patient presents with increase in creat >0.3 mg/dL above baseline, creat increase >1.5x baseline presumed to have occurred within past 7 days PTA - creat 2.33 on admission; may even be due in part to severe constipation -Responding to IV fluids, continue fluids longer  Hypovolemic hyponatremia - Sodium 126 on admission, likely due to dehydration and poor intake - Continue fluids - Sodium responding.  Repeat BMP in a.m.  Generalized muscle weakness - Patient mostly uses wheelchair but states that she can walk some with walker  Hypertension - Continue amlodipine - Enalapril on hold due to AKI  Old records reviewed in assessment of this patient  Antimicrobials: Rocephin 5/28 >> current  DVT prophylaxis: enoxaparin (LOVENOX) injection 30 mg Start: 08/27/20 2345   Code Status:   Code Status: DNR Family Communication:   Disposition Plan: Status is: Observation  The patient will require care spanning > 2 midnights and should be moved to inpatient because: IV treatments appropriate due to intensity of illness or inability to take PO, Inpatient level of care appropriate due to severity of illness  and on IVF and IV abx  Dispo: The patient is from: SNF              Anticipated d/c is to: SNF              Patient currently is not medically stable to d/c.   Difficult to place patient No  Risk of unplanned readmission score:     Objective: Blood pressure  (!) 117/59, pulse 81, temperature 98.4 F (36.9 C), resp. rate 17, height 5\' 5"  (1.651 m), weight 76.6 kg, SpO2 96 %.  Examination: General appearance: alert, cooperative and no distress Head: Normocephalic, without obvious abnormality, atraumatic Eyes: EOMI Lungs: clear to auscultation bilaterally Heart: regular rate and rhythm and S1, S2 normal Abdomen: soft, NT, ND, BS present Extremities: no edema Skin: mobility and turgor normal Neurologic: generalized weakness, no focal deficits   Consultants:   n/a  Procedures:     Data Reviewed: I have personally reviewed following labs and imaging studies Results for orders placed or performed during the hospital encounter of 08/27/20 (from the past 24 hour(s))  Comprehensive metabolic panel     Status: Abnormal   Collection Time: 08/27/20  5:04 PM  Result Value Ref Range   Sodium 126 (L) 135 - 145 mmol/L   Potassium 5.3 (H) 3.5 - 5.1 mmol/L   Chloride 98 98 - 111 mmol/L   CO2 19 (L) 22 - 32 mmol/L   Glucose, Bld 138 (H) 70 - 99 mg/dL   BUN 59 (H) 8 - 23 mg/dL   Creatinine, Ser 2.33 (H) 0.44 - 1.00 mg/dL   Calcium 10.2 8.9 - 10.3 mg/dL   Total Protein 7.3 6.5 - 8.1 g/dL   Albumin 3.5 3.5 - 5.0 g/dL   AST 15 15 - 41 U/L   ALT 11 0 - 44 U/L   Alkaline Phosphatase 113 38 - 126 U/L   Total Bilirubin 0.8 0.3 - 1.2 mg/dL   GFR, Estimated 20 (L) >60 mL/min   Anion gap 9 5 - 15  CBC     Status: Abnormal   Collection Time: 08/27/20  5:04 PM  Result Value Ref Range   WBC 11.0 (H) 4.0 - 10.5 K/uL   RBC 4.19 3.87 - 5.11 MIL/uL   Hemoglobin 12.3 12.0 - 15.0 g/dL   HCT 35.5 (L) 36.0 - 46.0 %   MCV 84.7 80.0 - 100.0 fL   MCH 29.4 26.0 - 34.0 pg   MCHC 34.6 30.0 - 36.0 g/dL   RDW 14.2 11.5 - 15.5 %   Platelets 396 150 - 400 K/uL   nRBC 0.0 0.0 - 0.2 %  Lipase, blood     Status: None   Collection Time: 08/27/20  9:20 PM  Result Value Ref Range   Lipase 49 11 - 51 U/L  Troponin I (High Sensitivity)     Status: None   Collection  Time: 08/27/20  9:20 PM  Result Value Ref Range   Troponin I (High Sensitivity) 12 <18 ng/L  Urinalysis, Complete w Microscopic     Status: Abnormal   Collection Time: 08/27/20 11:00 PM  Result Value Ref Range   Color, Urine YELLOW (A) YELLOW   APPearance TURBID (A) CLEAR   Specific Gravity, Urine 1.005 1.005 - 1.030   pH 5.0 5.0 - 8.0   Glucose, UA NEGATIVE NEGATIVE mg/dL   Hgb urine dipstick LARGE (A) NEGATIVE   Bilirubin Urine NEGATIVE NEGATIVE   Ketones, ur NEGATIVE NEGATIVE mg/dL   Protein, ur  100 (A) NEGATIVE mg/dL   Nitrite NEGATIVE NEGATIVE   Leukocytes,Ua MODERATE (A) NEGATIVE   RBC / HPF >50 (H) 0 - 5 RBC/hpf   WBC, UA >50 (H) 0 - 5 WBC/hpf   Bacteria, UA MANY (A) NONE SEEN   Squamous Epithelial / LPF 21-50 0 - 5   WBC Clumps PRESENT    Mucus PRESENT    Hyaline Casts, UA PRESENT   Osmolality, urine     Status: Abnormal   Collection Time: 08/27/20 11:00 PM  Result Value Ref Range   Osmolality, Ur 212 (L) 300 - 900 mOsm/kg  Sodium, urine, random     Status: None   Collection Time: 08/27/20 11:00 PM  Result Value Ref Range   Sodium, Ur 29 mmol/L  Troponin I (High Sensitivity)     Status: None   Collection Time: 08/27/20 11:30 PM  Result Value Ref Range   Troponin I (High Sensitivity) 13 <18 ng/L  Osmolality     Status: None   Collection Time: 08/27/20 11:30 PM  Result Value Ref Range   Osmolality 288 275 - 295 mOsm/kg  Resp Panel by RT-PCR (Flu A&B, Covid) Nasopharyngeal Swab     Status: Abnormal   Collection Time: 08/27/20 11:30 PM   Specimen: Nasopharyngeal Swab; Nasopharyngeal(NP) swabs in vial transport medium  Result Value Ref Range   SARS Coronavirus 2 by RT PCR POSITIVE (A) NEGATIVE   Influenza A by PCR NEGATIVE NEGATIVE   Influenza B by PCR NEGATIVE NEGATIVE  Basic metabolic panel     Status: Abnormal   Collection Time: 08/28/20  5:11 AM  Result Value Ref Range   Sodium 132 (L) 135 - 145 mmol/L   Potassium 4.6 3.5 - 5.1 mmol/L   Chloride 105 98 - 111  mmol/L   CO2 18 (L) 22 - 32 mmol/L   Glucose, Bld 92 70 - 99 mg/dL   BUN 51 (H) 8 - 23 mg/dL   Creatinine, Ser 1.80 (H) 0.44 - 1.00 mg/dL   Calcium 9.7 8.9 - 10.3 mg/dL   GFR, Estimated 27 (L) >60 mL/min   Anion gap 9 5 - 15  CBC     Status: Abnormal   Collection Time: 08/28/20  5:11 AM  Result Value Ref Range   WBC 8.6 4.0 - 10.5 K/uL   RBC 3.99 3.87 - 5.11 MIL/uL   Hemoglobin 11.6 (L) 12.0 - 15.0 g/dL   HCT 33.4 (L) 36.0 - 46.0 %   MCV 83.7 80.0 - 100.0 fL   MCH 29.1 26.0 - 34.0 pg   MCHC 34.7 30.0 - 36.0 g/dL   RDW 14.0 11.5 - 15.5 %   Platelets 374 150 - 400 K/uL   nRBC 0.0 0.0 - 0.2 %    Recent Results (from the past 240 hour(s))  Resp Panel by RT-PCR (Flu A&B, Covid) Nasopharyngeal Swab     Status: Abnormal   Collection Time: 08/27/20 11:30 PM   Specimen: Nasopharyngeal Swab; Nasopharyngeal(NP) swabs in vial transport medium  Result Value Ref Range Status   SARS Coronavirus 2 by RT PCR POSITIVE (A) NEGATIVE Final    Comment: RESULT CALLED TO, READ BACK BY AND VERIFIED WITHStanton Kidney [facility] @ [phone] 08/28/20 LFD (NOTE) SARS-CoV-2 target nucleic acids are DETECTED.  The SARS-CoV-2 RNA is generally detectable in upper respiratory specimens during the acute phase of infection. Positive results are indicative of the presence of the identified virus, but do not rule out bacterial infection or co-infection with other pathogens not detected  by the test. Clinical correlation with patient history and other diagnostic information is necessary to determine patient infection status. The expected result is Negative.  Fact Sheet for Patients: EntrepreneurPulse.com.au  Fact Sheet for Healthcare Providers: IncredibleEmployment.be  This test is not yet approved or cleared by the [place] FDA and  has been authorized for detection and/or diagnosis of SARS-CoV-2 by FDA under an Emergency Use Authorization (EUA).  This EUA will remain in  effect (meaning this test ca n be used) for the duration of  the COVID-19 declaration under Section 564(b)(1) of the Act, 21 U.S.C. section 360bbb-3(b)(1), unless the authorization is terminated or revoked sooner.     Influenza A by PCR NEGATIVE NEGATIVE Final   Influenza B by PCR NEGATIVE NEGATIVE Final    Comment: (NOTE) The Xpert Xpress SARS-CoV-2/FLU/RSV plus assay is intended as an aid in the diagnosis of influenza from Nasopharyngeal swab specimens and should not be used as a sole basis for treatment. Nasal washings and aspirates are unacceptable for Xpert Xpress SARS-CoV-2/FLU/RSV testing.  Fact Sheet for Patients: EntrepreneurPulse.com.au  Fact Sheet for Healthcare Providers: IncredibleEmployment.be  This test is not yet approved or cleared by the [place] FDA and has been authorized for detection and/or diagnosis of SARS-CoV-2 by FDA under an Emergency Use Authorization (EUA). This EUA will remain in effect (meaning this test can be used) for the duration of the COVID-19 declaration under Section 564(b)(1) of the Act, 21 U.S.C. section 360bbb-3(b)(1), unless the authorization is terminated or revoked.  Performed at [facility], [address], [place], [place] [address]      Radiology Studies: CT ABDOMEN PELVIS WO CONTRAST  Result Date: 08/27/2020 CLINICAL DATA:  Abdominal pain and distension. EXAM: CT ABDOMEN AND PELVIS WITHOUT CONTRAST TECHNIQUE: Multidetector CT imaging of the abdomen and pelvis was performed following the standard protocol without IV contrast. COMPARISON:  None. FINDINGS: Lower chest: No acute airspace disease or pleural effusion. Question of mild subpleural reticulation in the lingula and right middle lobe. Aortic atherosclerosis and coronary artery calcifications. Hepatobiliary: No focal liver abnormality on this noncontrast exam. Gallbladder physiologically distended, no calcified stone. No  biliary dilatation. Pancreas: No ductal dilatation or inflammation. Spleen: Normal in size without focal abnormality. Adrenals/Urinary Tract: 19 mm low-density right adrenal nodule is consistent with adenoma. Normal left adrenal gland. Slight prominence of the right renal pelvis without frank hydronephrosis. No significant perinephric edema. No left hydronephrosis. No renal or ureteral stones. Partially distended urinary bladder. There is diffuse perivesicular fat stranding and wall thickening about the bladder dome. Stomach/Bowel: Prominent rectosigmoid distention with stool, rectal dimension of 10 cm. There is mild rectal wall thickening and perirectal fat stranding. No bowel pneumatosis. Moderate volume of stool throughout the remainder of the colon. Two enteric sutures are noted in the sigmoid. Scattered colonic diverticulosis without focal diverticulitis. Appendix is not visualized. No appendicitis. There is no small bowel obstruction or inflammation. Decompressed stomach. Vascular/Lymphatic: Aortic atherosclerosis. No aortic aneurysm. No portal venous or mesenteric gas. Small retroperitoneal lymph nodes are not enlarged by size criteria. No enlarged lymph nodes in the abdomen or pelvis. Reproductive: Uterus surgically absent. Quiescent ovaries. No adnexal mass. Other: No free air, free fluid, or intra-abdominal fluid collection. Postsurgical change of the anterior abdominal wall. Musculoskeletal: Bones diffusely under mineralized. There is anterolisthesis of L5 on S1 and L4 on L5 without visualized pars defects. Mild inferior endplate compression deformities of T12 and L1, mild superior compression deformity of L5. Bones are diffusely under mineralized. Surgical  hardware in the right proximal femur. IMPRESSION: 1. Prominent rectosigmoid distention with stool, rectal dimension of 10 cm. Mild rectal wall thickening and perirectal fat stranding, suspicious for fecal impaction. 2. Diffuse perivesicular fat  stranding and wall thickening about the bladder dome, suspicious for cystitis. Recommend correlation with urinalysis. 3. Colonic diverticulosis without diverticulitis. 4. Right adrenal adenoma. 5. Mild T12, L1 and L5 compression deformities, age indeterminate, but likely chronic. Aortic Atherosclerosis (ICD10-I70.0). Electronically Signed   By: Keith Rake M.D.   On: 08/27/2020 21:02   CT ABDOMEN PELVIS WO CONTRAST  Final Result      Scheduled Meds: . enoxaparin (LOVENOX) injection  30 mg Subcutaneous Q24H  . polyethylene glycol  17 g Oral Daily  . senna-docusate  1 tablet Oral BID   PRN Meds: acetaminophen **OR** acetaminophen, ondansetron **OR** ondansetron (ZOFRAN) IV, oxyCODONE Continuous Infusions: . sodium chloride 75 mL/hr at 08/28/20 0755     LOS: 0 days  Time spent: Greater than 50% of the 35 minute visit was spent in counseling/coordination of care for the patient as laid out in the A&P.   Dwyane Dee, MD [facility] 08/28/2020, 12:13 PM

## 2020-08-28 NOTE — Progress Notes (Signed)
Admitted pt from the ED, transported via stretcher. Pt is aox4, c/o of pain on bil knees and abdomen. Medicated with tylenol. Bladder scan revealed 13 ml. Pt voided 150 ml of yellow urine with sediment. Urine specimen sent. IVF started. Call bell placed within reach. Orientation given. Bed alarm activated. Will monitor.

## 2020-08-29 DIAGNOSIS — K5641 Fecal impaction: Secondary | ICD-10-CM

## 2020-08-29 DIAGNOSIS — N179 Acute kidney failure, unspecified: Secondary | ICD-10-CM | POA: Diagnosis not present

## 2020-08-29 DIAGNOSIS — N3001 Acute cystitis with hematuria: Secondary | ICD-10-CM | POA: Diagnosis not present

## 2020-08-29 LAB — BASIC METABOLIC PANEL
Anion gap: 7 (ref 5–15)
BUN: 28 mg/dL — ABNORMAL HIGH (ref 8–23)
CO2: 19 mmol/L — ABNORMAL LOW (ref 22–32)
Calcium: 9.7 mg/dL (ref 8.9–10.3)
Chloride: 110 mmol/L (ref 98–111)
Creatinine, Ser: 1.01 mg/dL — ABNORMAL HIGH (ref 0.44–1.00)
GFR, Estimated: 54 mL/min — ABNORMAL LOW (ref >60)
Glucose, Bld: 83 mg/dL (ref 70–99)
Potassium: 4.8 mmol/L (ref 3.5–5.1)
Sodium: 136 mmol/L (ref 135–145)

## 2020-08-29 LAB — CBC WITH DIFFERENTIAL/PLATELET
Abs Immature Granulocytes: 0.04 10*3/uL (ref 0.00–0.07)
Basophils Absolute: 0 10*3/uL (ref 0.0–0.1)
Basophils Relative: 1 %
Eosinophils Absolute: 0.3 10*3/uL (ref 0.0–0.5)
Eosinophils Relative: 4 %
HCT: 31.5 % — ABNORMAL LOW (ref 36.0–46.0)
Hemoglobin: 10.7 g/dL — ABNORMAL LOW (ref 12.0–15.0)
Immature Granulocytes: 1 %
Lymphocytes Relative: 25 %
Lymphs Abs: 1.7 10*3/uL (ref 0.7–4.0)
MCH: 29.1 pg (ref 26.0–34.0)
MCHC: 34 g/dL (ref 30.0–36.0)
MCV: 85.6 fL (ref 80.0–100.0)
Monocytes Absolute: 0.8 10*3/uL (ref 0.1–1.0)
Monocytes Relative: 11 %
Neutro Abs: 4.1 10*3/uL (ref 1.7–7.7)
Neutrophils Relative %: 58 %
Platelets: 309 10*3/uL (ref 150–400)
RBC: 3.68 MIL/uL — ABNORMAL LOW (ref 3.87–5.11)
RDW: 14.5 % (ref 11.5–15.5)
WBC: 6.9 10*3/uL (ref 4.0–10.5)
nRBC: 0 % (ref 0.0–0.2)

## 2020-08-29 LAB — MAGNESIUM: Magnesium: 2 mg/dL (ref 1.7–2.4)

## 2020-08-29 MED ORDER — NAPROXEN 375 MG PO TABS: 375.0000 mg | ORAL_TABLET | Freq: Two times a day (BID) | ORAL | Status: DC | PRN

## 2020-08-29 MED ORDER — ENOXAPARIN SODIUM 40 MG/0.4ML IJ SOSY: 40.0000 mg | PREFILLED_SYRINGE | INTRAMUSCULAR | Status: DC

## 2020-08-29 MED ORDER — CEFUROXIME AXETIL 500 MG PO TABS: 500.0000 mg | ORAL_TABLET | Freq: Two times a day (BID) | ORAL | 0 refills | Status: AC

## 2020-08-29 MED ORDER — POLYETHYLENE GLYCOL 3350 17 G PO PACK: 17.0000 g | PACK | Freq: Every day | ORAL | 0 refills | Status: DC | PRN

## 2020-08-29 MED ORDER — SENNOSIDES-DOCUSATE SODIUM 8.6-50 MG PO TABS: 1.0000 | ORAL_TABLET | Freq: Two times a day (BID) | ORAL | Status: DC

## 2020-08-29 NOTE — NC FL2 (Signed)
[place] LEVEL OF CARE SCREENING TOOL     IDENTIFICATION  Patient Name: Virginia Clements Birthdate: 1929/04/18 Sex: female Admission Date (Current Location): 08/27/2020  [place] and [place] Number:  Engineering geologist and Address:  [facility], [address], [place], [place] [address]      Provider Number: [id number]  Attending Physician Name and Address:  Dwyane Dee, MD  Relative Name and Phone Number:       Current Level of Care: Hospital Recommended Level of Care: [place] Prior Approval Number:    Date Approved/Denied:   PASRR Number: [id number]  Discharge Plan: SNF    Current Diagnoses: Patient Active Problem List   Diagnosis Date Noted  . Hyponatremia 08/27/2020  . Hyperkalemia 08/27/2020  . Fecal impaction ([place]) 08/27/2020  . AKI (acute kidney injury) ([place]) 06/18/2020  . Vomiting 06/18/2020  . Wheelchair dependent 06/18/2020  . Benign essential HTN 06/18/2020  . Wheelchair bound 06/16/2020  . Generalized weakness 06/16/2020  . Sepsis secondary to UTI ([place]) 06/16/2020  . UTI (urinary tract infection) 06/15/2020  . Hypokalemia 05/31/2018    Orientation RESPIRATION BLADDER Height & Weight     Self,Situation,Place  Normal Incontinent Weight: 168 lb 14 oz (76.6 kg) Height:  5\' 5"  (165.1 cm)  BEHAVIORAL SYMPTOMS/MOOD NEUROLOGICAL BOWEL NUTRITION STATUS   (None)   Incontinent Diet (Low sodium, Heart healthy)  AMBULATORY STATUS COMMUNICATION OF NEEDS Skin   Extensive Assist Verbally Skin abrasions                       Personal Care Assistance Level of Assistance              Functional Limitations Info  Sight,Hearing,Speech Sight Info: Adequate Hearing Info: Adequate Speech Info: Adequate    SPECIAL CARE FACTORS FREQUENCY  PT (By licensed PT),OT (By licensed OT)     PT Frequency: 5 x week OT Frequency: 5 x week            Contractures Contractures Info: Not  present    Additional Factors Info  Code Status,Allergies Code Status Info: DNR Allergies Info: Alendronate, Codeine           Current Medications (08/29/2020):  This is the current hospital active medication list Current Facility-Administered Medications  Medication Dose Route Frequency Provider Last Rate Last Admin  . 0.9 %  sodium chloride infusion   Intravenous Continuous Dwyane Dee, MD 75 mL/hr at 08/28/20 2225 New Bag at 08/28/20 2225  . acetaminophen (TYLENOL) tablet 650 mg  650 mg Oral Q4H PRN Dwyane Dee, MD   650 mg at 08/28/20 1942   Or  . acetaminophen (TYLENOL) suppository 650 mg  650 mg Rectal Q6H PRN Dwyane Dee, MD      . cefTRIAXone (ROCEPHIN) 1 g in sodium chloride 0.9 % 100 mL IVPB  1 g Intravenous Q24H Dwyane Dee, MD 200 mL/hr at 08/29/20 1053 1 g at 08/29/20 1053  . enoxaparin (LOVENOX) injection 40 mg  40 mg Subcutaneous Q24H Dallie Piles, RPH      . ondansetron [facility]) tablet 4 mg  4 mg Oral Q6H PRN Athena Masse, MD       Or  . ondansetron [facility]) injection 4 mg  4 mg Intravenous Q6H PRN Athena Masse, MD      . oxyCODONE (Oxy IR/ROXICODONE) immediate release tablet 5 mg  5 mg Oral Q4H PRN Dwyane Dee, MD      .  polyethylene glycol (MIRALAX / GLYCOLAX) packet 17 g  17 g Oral Daily Athena Masse, MD   17 g at 08/28/20 5027  . senna-docusate (Senokot-S) tablet 1 tablet  1 tablet Oral BID Dwyane Dee, MD   1 tablet at 08/28/20 7412     Discharge Medications: Please see discharge summary for a list of discharge medications.  Relevant Imaging Results:  Relevant Lab Results:   Additional Information SS#: [id number]  Candie Chroman, LCSW

## 2020-08-29 NOTE — Discharge Summary (Addendum)
Physician Discharge Summary   Virginia Clements:[id number] DOB: Dec 03, 1929 DOA: 08/27/2020  PCP: Baxter Hire, MD  Admit date: 08/27/2020 Discharge date:  08/29/2020  Admitted From: SNF Disposition:  SNF Discharging physician: Dwyane Dee, MD  Recommendations for Outpatient Follow-up:  1. Sensitivities to [facility] in urine culture pending at time of discharge; if resistance seen to cefuroxime, will contact facility 2. Continue laxative regimen to prevent future constipation 3. Encourage oral hydration   Patient discharged to SNF in Discharge Condition: stable Risk of unplanned readmission score: Unplanned Admission- Pilot do not use: 11.05  CODE STATUS: DNR Diet recommendation:  Diet Orders (From admission, onward)    Start     Ordered   08/29/20 0000  Diet - low sodium heart healthy        08/29/20 1049      08/28/20 0139          Hospital Course: Virginia Clements a 87 y.o.femalewith medical history significant forHTN, mostly wheelchair-bound but states she ambulates some, hospitalized in March for E. coli UTI, currently on Macrobid as outpatient for UTI who was sent to the ER for evaluation of abnormal renal function tests. Patient was recently symptomatic for nausea and vomiting abdominal discomfort and she was found to have a UTI (has been on outpatient macrobid but still endorsing symptoms on admission of dysuria). She denied fever or chills, cough or shortness of breath, chest pain or change in bowel habits. On arrival, she had a soft blood pressure of 106/58 with pulse 85, afebrile, O2 sat 98% on room air. Blood work significant for creatinine of 2.33, up from a baseline of 0.93 a couple months prior with bicarb of 19 and potassium of 5.3. Sodium 126. WBC 11. Lipase normal troponin normal at 12.  CT abdomen and pelvis with without contrast significant for findings suspicious for fecal impaction as well as findings suspicious for  cystitis.  Patient was ordered a milk and molasses enema from the emergency room and urinalysis was sent off. IV fluid bolus was administered.  UTI - still symptomatic despite macrobid outpt; UA consistent with infection as well.  - urine culture growing. E coli, sensitivities pending - s/p 2 days Rocephin in hospital; d/c with cefuroxime for 3 more days. Previously has grown pansensitive E. Coli  Severe constipation - Seen on CT - Given milk & molasses enema on admission - Continue laxative treatment during hospitalization - constipation responded well to treatment   AKI - baseline creatinine ~ 0.9 - patient presents with increase in creat >0.3 mg/dL above baseline, creat increase >1.5x baseline presumed to have occurred within past 7 days PTA - creat 2.33 on admission; may even be due in part to severe constipation -Responding to IV fluids - creat improved to 1.01 at discharge   Hypovolemic hyponatremia - Sodium 126 on admission, likely due to dehydration and poor intake - Sodium responding.   - Na 136 on discharge   Generalized muscle weakness - Patient mostly uses wheelchair but states that she can walk some with walker  Hypertension - Continue amlodipine - Enalapril on hold due to AKI; resumed at discharge    Principal Diagnosis: AKI (acute kidney injury) [facility])  Discharge Diagnoses: Active Hospital Problems   Diagnosis Date Noted  . AKI (acute kidney injury) ([place]) 06/18/2020  . Hyponatremia 08/27/2020  . Hyperkalemia 08/27/2020  . Fecal impaction ([place]) 08/27/2020  . Benign essential HTN 06/18/2020  . Wheelchair bound 06/16/2020  . UTI (urinary tract infection) 06/15/2020  Resolved Hospital Problems  No resolved problems to display.    Discharge Instructions    Diet - low sodium heart healthy   Complete by: As directed    Increase activity slowly   Complete by: As directed      Allergies as of 08/29/2020      Reactions   Alendronate Other (See  Comments)   Nervousness and shakiness per pt   Codeine    dizziness      Medication List    TAKE these medications   acetaminophen 325 MG tablet Commonly known as: TYLENOL Take 2 tablets (650 mg total) by mouth every 6 (six) hours as needed for mild pain (or Fever >/= 101).   amLODipine 10 MG tablet Commonly known as: NORVASC Take 1 tablet (10 mg total) by mouth daily.   cefUROXime 500 MG tablet Commonly known as: CEFTIN Take 1 tablet (500 mg total) by mouth 2 (two) times daily with a meal for 3 days. Start taking on: Aug 30, 2020   enalapril 10 MG tablet Commonly known as: VASOTEC Take 10 mg by mouth daily.   metoprolol tartrate 25 MG tablet Commonly known as: LOPRESSOR Take 1 tablet (25 mg total) by mouth 2 (two) times daily.   mirtazapine 7.5 MG tablet Commonly known as: REMERON Take 7.5 mg by mouth at bedtime.   naproxen 375 MG tablet Commonly known as: NAPROSYN Take 1 tablet (375 mg total) by mouth 2 (two) times daily as needed for mild pain or moderate pain. What changed:   when to take this  reasons to take this   polyethylene glycol 17 g packet Commonly known as: MIRALAX / GLYCOLAX Take 17 g by mouth daily as needed for mild constipation or moderate constipation.   senna-docusate 8.6-50 MG tablet Commonly known as: Senokot-S Take 1 tablet by mouth 2 (two) times daily.   timolol 0.5 % ophthalmic gel-forming Commonly known as: TIMOPTIC-XR Place 1 drop into both eyes at bedtime.       Allergies  Allergen Reactions  . Alendronate Other (See Comments)    Nervousness and shakiness per pt  . Codeine     dizziness    Consultations: n/a  Discharge Exam: BP (!) 117/58 (BP Location: Left Arm)   Pulse 93   Temp 98 F (36.7 C)   Resp 16   Ht 5\' 5"  (1.651 m)   Wt 76.6 kg   SpO2 94%   BMI 28.10 kg/m  General appearance: alert, cooperative and no distress Head: Normocephalic, without obvious abnormality, atraumatic Eyes: EOMI Lungs: clear to  auscultation bilaterally Heart: regular rate and rhythm and S1, S2 normal Abdomen: soft, NT, ND, BS present Extremities: no edema Skin: mobility and turgor normal Neurologic: generalized weakness, no focal deficits   The results of significant diagnostics from this hospitalization (including imaging, microbiology, ancillary and laboratory) are listed below for reference.   Microbiology: Recent Results (from the past 240 hour(s))  Urine culture     Status: Abnormal (Preliminary result)   Collection Time: 08/27/20 11:00 PM   Specimen: Urine, Random  Result Value Ref Range Status   Specimen Description   Final    URINE, RANDOM Performed at [facility], [address] [place], [place] [address]    Special Requests   Final    NONE Performed at [facility], [address] [place], [place] [address]    Culture (A)  Final    >=100,000 COLONIES/mL ESCHERICHIA COLI SUSCEPTIBILITIES TO FOLLOW Performed at [facility]  [facility], [place] [address] [place], [place] [address]    Report Status PENDING  Incomplete  Resp Panel by RT-PCR (Flu A&B, Covid) Nasopharyngeal Swab     Status: Abnormal   Collection Time: 08/27/20 11:30 PM   Specimen: Nasopharyngeal Swab; Nasopharyngeal(NP) swabs in vial transport medium  Result Value Ref Range Status   SARS Coronavirus 2 by RT PCR POSITIVE (A) NEGATIVE Final    Comment: RESULT CALLED TO, READ BACK BY AND VERIFIED WITHJacelyn Grip @ 1941 08/28/20 LFD (NOTE) SARS-CoV-2 target nucleic acids are DETECTED.  The SARS-CoV-2 RNA is generally detectable in upper respiratory specimens during the acute phase of infection. Positive results are indicative of the presence of the identified virus, but do not rule out bacterial infection or co-infection with other pathogens not detected by the test. Clinical correlation with patient history and other diagnostic information is necessary to determine patient infection status. The expected  result is Negative.  Fact Sheet for Patients: EntrepreneurPulse.com.au  Fact Sheet for Healthcare Providers: IncredibleEmployment.be  This test is not yet approved or cleared by the [place] FDA and  has been authorized for detection and/or diagnosis of SARS-CoV-2 by FDA under an Emergency Use Authorization (EUA).  This EUA will remain in effect (meaning this test ca n be used) for the duration of  the COVID-19 declaration under Section 564(b)(1) of the Act, 21 U.S.C. section 360bbb-3(b)(1), unless the authorization is terminated or revoked sooner.     Influenza A by PCR NEGATIVE NEGATIVE Final   Influenza B by PCR NEGATIVE NEGATIVE Final    Comment: (NOTE) The Xpert Xpress SARS-CoV-2/FLU/RSV plus assay is intended as an aid in the diagnosis of influenza from Nasopharyngeal swab specimens and should not be used as a sole basis for treatment. Nasal washings and aspirates are unacceptable for Xpert Xpress SARS-CoV-2/FLU/RSV testing.  Fact Sheet for Patients: EntrepreneurPulse.com.au  Fact Sheet for Healthcare Providers: IncredibleEmployment.be  This test is not yet approved or cleared by the [place] FDA and has been authorized for detection and/or diagnosis of SARS-CoV-2 by FDA under an Emergency Use Authorization (EUA). This EUA will remain in effect (meaning this test can be used) for the duration of the COVID-19 declaration under Section 564(b)(1) of the Act, 21 U.S.C. section 360bbb-3(b)(1), unless the authorization is terminated or revoked.  Performed at [facility], [address] [place], [place] [address]      Labs: BNP (last 3 results) Recent Labs    06/15/20 0903  BNP 44.8   Basic Metabolic Panel: Recent Labs  Lab 08/27/20 1704 08/28/20 0511 08/29/20 0753  NA 126* 132* 136  K 5.3* 4.6 4.8  CL 98 105 110  CO2 19* 18* 19*  GLUCOSE 138* 92 83  BUN 59* 51*  28*  CREATININE 2.33* 1.80* 1.01*  CALCIUM 10.2 9.7 9.7  MG  --   --  2.0   Liver Function Tests: Recent Labs  Lab 08/27/20 1704  AST 15  ALT 11  ALKPHOS 113  BILITOT 0.8  PROT 7.3  ALBUMIN 3.5   Recent Labs  Lab 08/27/20 2120  LIPASE 49   No results for input(s): AMMONIA in the last 168 hours. CBC: Recent Labs  Lab 08/27/20 1704 08/28/20 0511 08/29/20 0753  WBC 11.0* 8.6 6.9  NEUTROABS  --   --  4.1  HGB 12.3 11.6* 10.7*  HCT 35.5* 33.4* 31.5*  MCV 84.7 83.7 85.6  PLT 396 374 309   Cardiac Enzymes: No results for input(s):  CKTOTAL, CKMB, CKMBINDEX, TROPONINI in the last 168 hours. BNP: Invalid input(s): POCBNP CBG: No results for input(s): GLUCAP in the last 168 hours. D-Dimer No results for input(s): DDIMER in the last 72 hours. Hgb A1c No results for input(s): HGBA1C in the last 72 hours. Lipid Profile No results for input(s): CHOL, HDL, LDLCALC, TRIG, CHOLHDL, LDLDIRECT in the last 72 hours. Thyroid function studies No results for input(s): TSH, T4TOTAL, T3FREE, THYROIDAB in the last 72 hours.  Invalid input(s): FREET3 Anemia work up No results for input(s): VITAMINB12, FOLATE, FERRITIN, TIBC, IRON, RETICCTPCT in the last 72 hours. Urinalysis    Component Value Date/Time   COLORURINE YELLOW (A) 08/27/2020 2300   APPEARANCEUR TURBID (A) 08/27/2020 2300   APPEARANCEUR Clear 08/10/2011 0226   LABSPEC 1.005 08/27/2020 2300   LABSPEC 1.025 08/10/2011 0226   PHURINE 5.0 08/27/2020 2300   GLUCOSEU NEGATIVE 08/27/2020 2300   GLUCOSEU Negative 08/10/2011 0226   HGBUR LARGE (A) 08/27/2020 2300   BILIRUBINUR NEGATIVE 08/27/2020 2300   BILIRUBINUR Negative 08/10/2011 0226   KETONESUR NEGATIVE 08/27/2020 2300   PROTEINUR 100 (A) 08/27/2020 2300   NITRITE NEGATIVE 08/27/2020 2300   LEUKOCYTESUR MODERATE (A) 08/27/2020 2300   LEUKOCYTESUR Negative 08/10/2011 0226   Sepsis Labs Invalid input(s): PROCALCITONIN,  WBC,  LACTICIDVEN Microbiology Recent  Results (from the past 240 hour(s))  Urine culture     Status: Abnormal (Preliminary result)   Collection Time: 08/27/20 11:00 PM   Specimen: Urine, Random  Result Value Ref Range Status   Specimen Description   Final    URINE, RANDOM Performed at [facility], [address] [place], [place] [address]    Special Requests   Final    NONE Performed at [facility], [address] [place], [place] [address]    Culture (A)  Final    >=100,000 COLONIES/mL ESCHERICHIA COLI SUSCEPTIBILITIES TO FOLLOW Performed at [facility], [place] [address] [place],  [address]    Report Status PENDING  Incomplete  Resp Panel by RT-PCR (Flu A&B, Covid) Nasopharyngeal Swab     Status: Abnormal   Collection Time: 08/27/20 11:30 PM   Specimen: Nasopharyngeal Swab; Nasopharyngeal(NP) swabs in vial transport medium  Result Value Ref Range Status   SARS Coronavirus 2 by RT PCR POSITIVE (A) NEGATIVE Final    Comment: RESULT CALLED TO, READ BACK BY AND VERIFIED WITHJacelyn Grip @ 8416 08/28/20 LFD (NOTE) SARS-CoV-2 target nucleic acids are DETECTED.  The SARS-CoV-2 RNA is generally detectable in upper respiratory specimens during the acute phase of infection. Positive results are indicative of the presence of the identified virus, but do not rule out bacterial infection or co-infection with other pathogens not detected by the test. Clinical correlation with patient history and other diagnostic information is necessary to determine patient infection status. The expected result is Negative.  Fact Sheet for Patients: EntrepreneurPulse.com.au  Fact Sheet for Healthcare Providers: IncredibleEmployment.be  This test is not yet approved or cleared by the [place] FDA and  has been authorized for detection and/or diagnosis of SARS-CoV-2 by FDA under an Emergency Use Authorization (EUA).  This EUA will remain in effect (meaning  this test ca n be used) for the duration of  the COVID-19 declaration under Section 564(b)(1) of the Act, 21 U.S.C. section 360bbb-3(b)(1), unless the authorization is terminated or revoked sooner.     Influenza A by PCR NEGATIVE NEGATIVE Final   Influenza B by PCR NEGATIVE NEGATIVE Final    Comment: (  NOTE) The Xpert Xpress SARS-CoV-2/FLU/RSV plus assay is intended as an aid in the diagnosis of influenza from Nasopharyngeal swab specimens and should not be used as a sole basis for treatment. Nasal washings and aspirates are unacceptable for Xpert Xpress SARS-CoV-2/FLU/RSV testing.  Fact Sheet for Patients: EntrepreneurPulse.com.au  Fact Sheet for Healthcare Providers: IncredibleEmployment.be  This test is not yet approved or cleared by the [place] FDA and has been authorized for detection and/or diagnosis of SARS-CoV-2 by FDA under an Emergency Use Authorization (EUA). This EUA will remain in effect (meaning this test can be used) for the duration of the COVID-19 declaration under Section 564(b)(1) of the Act, 21 U.S.C. section 360bbb-3(b)(1), unless the authorization is terminated or revoked.  Performed at [facility], [address] [place], [place] [address]     Procedures/Studies: CT ABDOMEN PELVIS WO CONTRAST  Result Date: 08/27/2020 CLINICAL DATA:  Abdominal pain and distension. EXAM: CT ABDOMEN AND PELVIS WITHOUT CONTRAST TECHNIQUE: Multidetector CT imaging of the abdomen and pelvis was performed following the standard protocol without IV contrast. COMPARISON:  None. FINDINGS: Lower chest: No acute airspace disease or pleural effusion. Question of mild subpleural reticulation in the lingula and right middle lobe. Aortic atherosclerosis and coronary artery calcifications. Hepatobiliary: No focal liver abnormality on this noncontrast exam. Gallbladder physiologically distended, no calcified stone. No biliary dilatation.  Pancreas: No ductal dilatation or inflammation. Spleen: Normal in size without focal abnormality. Adrenals/Urinary Tract: 19 mm low-density right adrenal nodule is consistent with adenoma. Normal left adrenal gland. Slight prominence of the right renal pelvis without frank hydronephrosis. No significant perinephric edema. No left hydronephrosis. No renal or ureteral stones. Partially distended urinary bladder. There is diffuse perivesicular fat stranding and wall thickening about the bladder dome. Stomach/Bowel: Prominent rectosigmoid distention with stool, rectal dimension of 10 cm. There is mild rectal wall thickening and perirectal fat stranding. No bowel pneumatosis. Moderate volume of stool throughout the remainder of the colon. Two enteric sutures are noted in the sigmoid. Scattered colonic diverticulosis without focal diverticulitis. Appendix is not visualized. No appendicitis. There is no small bowel obstruction or inflammation. Decompressed stomach. Vascular/Lymphatic: Aortic atherosclerosis. No aortic aneurysm. No portal venous or mesenteric gas. Small retroperitoneal lymph nodes are not enlarged by size criteria. No enlarged lymph nodes in the abdomen or pelvis. Reproductive: Uterus surgically absent. Quiescent ovaries. No adnexal mass. Other: No free air, free fluid, or intra-abdominal fluid collection. Postsurgical change of the anterior abdominal wall. Musculoskeletal: Bones diffusely under mineralized. There is anterolisthesis of L5 on S1 and L4 on L5 without visualized pars defects. Mild inferior endplate compression deformities of T12 and L1, mild superior compression deformity of L5. Bones are diffusely under mineralized. Surgical hardware in the right proximal femur. IMPRESSION: 1. Prominent rectosigmoid distention with stool, rectal dimension of 10 cm. Mild rectal wall thickening and perirectal fat stranding, suspicious for fecal impaction. 2. Diffuse perivesicular fat stranding and wall  thickening about the bladder dome, suspicious for cystitis. Recommend correlation with urinalysis. 3. Colonic diverticulosis without diverticulitis. 4. Right adrenal adenoma. 5. Mild T12, L1 and L5 compression deformities, age indeterminate, but likely chronic. Aortic Atherosclerosis (ICD10-I70.0). Electronically Signed   By: Keith Rake M.D.   On: 08/27/2020 21:02     Time coordinating discharge: Over 30 minutes    Dwyane Dee, MD  [facility] 08/29/2020, 10:50 AM

## 2020-08-29 NOTE — TOC Transition Note (Signed)
Transition of Care [facility]) - CM/SW Discharge Note   Patient Details  Name: Virginia Clements MRN: [id number] Date of Birth: 1929-04-17  [facility] CM/SW Contact:  Candie Chroman, LCSW Phone Number: 08/29/2020, 11:56 AM   Clinical Narrative:  Patient has orders to discharge to [facility] SNF today. RN has already called report. EMS transport is set up and she is first on the list. No further concerns. CSW signing off.   Final next level of care: [place] Barriers to Discharge: No Barriers Identified   Patient Goals and CMS Choice     Choice offered to / list presented to : Sibling  Discharge Placement   Existing PASRR number confirmed : 08/29/20          Patient [facility] bed at: [facility] Patient to be transferred to facility by: EMS Name of family member notified: Rush Landmark and [name] Patient and family notified of of transfer: 08/29/20  Discharge Plan and Services                                     Social Determinants of Health (SDOH) Interventions     Readmission Risk Interventions Readmission Risk Prevention Plan 06/16/2020  Post Dischage Appt Complete  Medication Screening Complete  Transportation Screening Complete  Some recent data might be hidden

## 2020-08-29 NOTE — Progress Notes (Signed)
Called report to Delsa Sale LPN at [facility]  [phone] patient is going to room 19B

## 2020-08-30 LAB — URINE CULTURE: Culture: >=100000 — AB

## 2020-10-22 ENCOUNTER — Encounter: Payer: Self-pay | Admitting: Nurse Practitioner

## 2020-10-22 ENCOUNTER — Non-Acute Institutional Stay: Payer: Medicare Other | Admitting: Nurse Practitioner

## 2020-10-22 VITALS — BP 128/77 | HR 66 | Temp 98.1°F | Wt 157.0 lb

## 2020-10-22 DIAGNOSIS — R5381 Other malaise: Secondary | ICD-10-CM

## 2020-10-22 DIAGNOSIS — Z515 Encounter for palliative care: Secondary | ICD-10-CM

## 2020-10-22 NOTE — Progress Notes (Signed)
[facility] Pall[facility] Consult Note Telephone: [phone]  Fax: [phone]   Date of encounter: 10/22/20 PATIENT NAME: Virginia Clements [id number]   [phone] (home)  DOB: 22-Apr-1929 MRN: [id number] PRIMARY CARE PROVIDER:   Dr Lucianne Lei [facility] Baxter Hire, MD,  [place] [address] [phone]  RESPONSIBLE PARTY:    Contact Information     Name Relation Home Work Mobile   [name] Sister [phone]        I met face to face with patient in facility. Palliative Care was asked to follow this patient by consultation request of Dr Nona Dell to address advance care planning and complex medical decision making. This is the initial visit.  ASSESSMENT AND PLAN / RECOMMENDATIONS:   Advance Care Planning/Goals of Care: Goals include to maximize quality of life and symptom management. Patient/health care surrogate gave his/her permission to discuss.Our advance care planning conversation included a discussion about:    The value and importance of advance care planning  Experiences with loved ones who have been seriously ill or have died  Exploration of personal, cultural or spiritual beliefs that might influence medical decisions  Exploration of goals of care in the event of a sudden injury or illness  Identification and preparation of a healthcare agent  Review and updating or creation of an  advance directive document . Decision not to resuscitate or to de-escalate disease focused treatments due to poor prognosis. CODE STATUS:  Symptom Management/Plan: ACP: DNR in place. Will put in vynca 2.  Debility secondary to deconditioning; continue to encourage mobility, self independence, continue OOB 3. Goals of Care: Goals include to maximize quality of life and symptom management. Our advance care planning conversation included a discussion about:    The value  and importance of advance care planning  Exploration of personal, cultural or spiritual beliefs that might influence medical decisions  Exploration of goals of care in the event of a sudden injury or illness  Identification and preparation of a healthcare agent  Review and updating or creation of an advance directive document. 4. Palliative care encounter; Palliative care encounter; Palliative medicine team will continue to support patient, patient's family, and medical team. Visit consisted of counseling and education dealing with the complex and emotionally intense issues of symptom management and palliative care in the setting of serious and potentially life-threatening illness 5. f/u 2 weeks for ongoing monitoring chronic disease progression, ongoing discussions complex medical decision making  Follow up Palliative Care Visit: Palliative care will continue to follow for complex medical decision making, advance care planning, and clarification of goals. Return 2 weeks or prn.  I spent 65 minutes providing this consultation. More than 50% of the time in this consultation was spent in counseling and care coordination.  PPS: 40%  Chief Complaint: Initial Palliative consult for complex medical decision making  HISTORY OF PRESENT ILLNESS:  Virginia Clements is a 87 y.o. year old female  with Hypertension, wheelchair dependent, depression, protein calorie malnutrition. Virginia Clements resides at [facility] skilled nursing facility following Hospitalization that transitioned from short term rehab transitioned to long term care.  Virginia Clements does require assistance with transfers but is able to sit up in a wheelchair.  Virginia Clements does require assistance with bathing, dressing, toileting.  Virginia Clements does feed herself and appetite has been fair. Virginia Clements is on a regular diet regular texture, regular liquid consistency with  medplus supplement. Current weight is 157  pounds with BMI 28.7.  Virginia Clements is had a 3.4 lb weight loss in four weeks. Followed by Psychotherapy and Psychiatry with last Psychiatry visit 09/30/3020. Virginia Clements  is a DNR.  Virginia Clements was hospitalized in March for E.Coli infection Treated with macrobid. Virginia Clements  was hospitalized 08/27/2020 to 08/29/2020 for abnormal renal function tests with recent nausea and vomiting, abdominal discomfort and found to have a UTI. CT abdomen and pelvis significant finding suspicious for fecal impaction as well as suspicious for cystitis. Received antibiotics and IV fluids. Enema given. Acute kidney injury with baseline creatinine from 2.33 on admission to 1.01 at discharge noted to have severe constipation could possibly be the cause. Hypovolemic with hyponatremia with sodium 126 on admission likely due to dehydration and poor oral intake increase to 136 upon discharge. Virginia Clements returned to [facility] when stabilized. Staff endorses no new changes or concerns. At present Virginia Clements is lying in bed, appears comfortable. Virginia Clements  does make eye contact to verbal cues and is interactive. We talked about purpose of palliative care visit.  Virginia Clements agreed. We talked about how she was feeling today.  Virginia Clements endorses that overall;  Virginia Clements is doing okay. It is challenging with food as it does not always taste the way she hopes. We talked about our appetite. We talked about symptoms of pain and shortness of breath which currently [name] is comfortable. We talked about the last time [name] was independent at home as she has been widowed for quite some time. Life review done. We talked about she's retired from [employer] doing various jobs. We talked about her living with her sister prior to hospitalizations. We talked about transition to skilled facility. We talked about medical goals of care period we talked about quality-of-life. We  talked about mobility and being in her wheelchair. We talked about her daily routine. We talked about role of palliative care and plan of care. We talked about follow up visit, Virginia Clements agreed. I updated nursing staff no new changes at present time.  History obtained from review of EMR, discussion with facility staff and Virginia Clements.  I reviewed available labs, medications, imaging, studies and related documents from the EMR.  Records reviewed and summarized above.   ROS Full 14 system review of systems performed and negative with exception of: as per HPI.   Physical Exam: Constitutional: NAD General: obese debilitated pleasant female EYES: lids intact ENMT: oral mucous membranes moist CV: S1S2, RRR Pulmonary: LCTA, no increased work of breathing, no cough Abdomen: soft and non tender MSK: non-ambulatory; w/c dependent Skin: warm and dry Neuro:  + generalized weakness,  no cognitive impairment Psych: non-anxious affect, A and O x 3  CURRENT PROBLEM LIST:  Patient Active Problem List   Diagnosis Date Noted   Hyponatremia 08/27/2020   Hyperkalemia 08/27/2020   Fecal impaction ([place]) 08/27/2020   AKI (acute kidney injury) ([place]) 06/18/2020   Vomiting 06/18/2020   Wheelchair dependent 06/18/2020   Benign essential HTN 06/18/2020   Wheelchair bound 06/16/2020   Generalized weakness 06/16/2020   Sepsis secondary to UTI ([place]) 06/16/2020   UTI (urinary tract infection) 06/15/2020   Hypokalemia 05/31/2018   PAST MEDICAL HISTORY:  Active Ambulatory Problems    Diagnosis Date Noted   Hypokalemia 05/31/2018   UTI (urinary tract infection) 06/15/2020   Wheelchair bound 06/16/2020   Generalized weakness 06/16/2020   Sepsis secondary to UTI ([place])  06/16/2020   AKI (acute kidney injury) ([place]) 06/18/2020   Vomiting 06/18/2020   Wheelchair dependent 06/18/2020   Benign essential HTN 06/18/2020   Hyponatremia 08/27/2020   Hyperkalemia 08/27/2020   Fecal impaction ([place])  08/27/2020   Resolved Ambulatory Problems    Diagnosis Date Noted   Hypertension complicating diabetes ([place]) 06/15/2020   Past Medical History:  Diagnosis Date   Breast cancer ([place])    Hypertension    Hypoglycemia    Radiation 2009   SOCIAL HX:  Social History   Tobacco Use   Smoking status: Former   Smokeless tobacco: Never  Substance Use Topics   Alcohol use: Not Currently   FAMILY HX:  Family History  Problem Relation Age of Onset   Hypertension Mother     reviewed  ALLERGIES:  Allergies  Allergen Reactions   Alendronate Other (See Comments)    Nervousness and shakiness per pt   Codeine     dizziness     Questions and concerns were addressed.  Provided general support and encouragement, no other unmet needs identified  Thank you for the opportunity to participate in the care of Ms. Decker.  The palliative care team will continue to follow. Please call our office at [phone] if we can be of additional assistance.  This chart was dictated using voice recognition software.  Despite best efforts to proofread,  errors can occur which can change the documentation meaning.   Emmerich Cryer Z Izick Gasbarro, NP ,

## 2020-10-25 ENCOUNTER — Other Ambulatory Visit: Payer: Self-pay

## 2020-11-18 ENCOUNTER — Other Ambulatory Visit: Payer: Self-pay

## 2020-11-18 ENCOUNTER — Encounter: Payer: Self-pay | Admitting: Nurse Practitioner

## 2020-11-18 ENCOUNTER — Non-Acute Institutional Stay: Payer: Medicare Other | Admitting: Nurse Practitioner

## 2020-11-18 VITALS — BP 136/70 | HR 78 | Temp 97.7°F | Resp 20 | Wt 158.0 lb

## 2020-11-18 DIAGNOSIS — R5381 Other malaise: Secondary | ICD-10-CM

## 2020-11-18 DIAGNOSIS — Z515 Encounter for palliative care: Secondary | ICD-10-CM

## 2020-11-18 NOTE — Progress Notes (Signed)
[facility] Palliative Care Consult Note Telephone: [phone]  Fax: [phone]    Date of encounter: 11/18/20 4:19 PM PATIENT NAME: Virginia Clements [place] [id number]   [phone] (home)  DOB: [birth year] MRN: [id number] PRIMARY CARE PROVIDER:    Dr Lucianne Lei [facility]  RESPONSIBLE PARTY:    Contact Information     Name Relation Home Work Mobile   [name] Sister [phone]        I met face to face with patient in facility. Palliative Care was asked to follow this patient by consultation request of  Dr Nona Dell to address advance care planning and complex medical decision making. This is a follow up visit ASSESSMENT AND PLAN / RECOMMENDATIONS:   Symptom Management/Plan: ACP: DNR in place. Will put in vynca 2.  Debility secondary to deconditioning; continue to encourage mobility, self independence, continue OOB 3. Palliative care encounter; Palliative care encounter; Palliative medicine team will continue to support patient, patient's family, and medical team. Visit consisted of counseling and education dealing with the complex and emotionally intense issues of symptom management and palliative care in the setting of serious and potentially life-threatening illness  Follow up Palliative Care Visit: Palliative care will continue to follow for complex medical decision making, advance care planning, and clarification of goals. Return 4 weeks or prn.  I spent 67 minutes providing this consultation. More than 50% of the time in this consultation was spent in counseling and care coordination. PPS: 30%  Chief Complaint: Follow up palliative consult for complex medical decision making/symptoms   HISTORY OF PRESENT ILLNESS:  Virginia Clements is a 87 y.o. year old female  with multiple medical problems including hypertension, wheelchair dependent, depression, protein calorie malnutrition. Ms.  Stauffer resides at [facility] skilled nursing facility following Hospitalization that transitioned from short term rehab transitioned to long term care.  Ms. Yepez does require assistance with transfers but is able to sit up in a wheelchair.  Ms. Flener does require assistance with bathing, dressing, toileting.  Ms. Bouler does feed herself and appetite has been fair. Ms. Hollars is on a regular diet regular texture, regular liquid consistency with medplus supplement. Staff endorses no new changes or concerns. At present Ms. Pamintuan is lying in bed, appears comfortable. No visitors present. Ms. Dene Gentry does make eye contact, answers some questions. Ms. Casares endorses her lack of motivation to get oob, to eat. Ms. Dene Gentry and I talked about quality of life, mobility, things that make her enjoy life. We talked about symptoms, currently asymptomatic. We talked about foods she likes. We talked about barriers residing at SNF, loss of independence, encouraged more discussion with psychotherapist/psychiatry who continues to follow at the facility. We talked about self care. Medical goals reviewed. We talked about role pc in poc. Therapeutic listening, emotional support provided. Encouraged Ms. Mandarino to get oob to w/c, go to activities, interact socially. Attempted to contact sister for update PC visit. I updated nursing staff  History obtained from review of EMR, discussion with facility staff and  Ms. Hoar.  I reviewed available labs, medications, imaging, studies and related documents from the EMR.  Records reviewed and summarized above.   ROS  Full 14 system review of systems performed and negative with exception of: as per HPI.   Physical Exam: Constitutional: NAD General: obese, debilitated, chronically ill, pleasantly confused female EYES: lids intact, no discharge  ENMT: oral mucous membranes moist CV: S1S2, RRR Pulmonary:  LCTA, no  increased work of breathing Abdomen: soft and non tender MSK: bedbound Skin: warm and dry Neuro:  + generalized weakness,  + cognitive impairment Psych: non-anxious affect, A and Oriented to self  Questions and concerns were addressed. Provided general support and encouragement, no other unmet needs identified   Thank you for the opportunity to participate in the care of Ms. Farner.  The palliative care team will continue to follow. Please call our office at [phone] if we can be of additional assistance.   This chart was dictated using voice recognition software.  Despite best efforts to proofread,  errors can occur which can change the documentation meaning.   Briggette Najarian Ihor Gully, NP

## 2020-12-20 ENCOUNTER — Other Ambulatory Visit: Payer: Self-pay

## 2020-12-20 ENCOUNTER — Non-Acute Institutional Stay: Payer: Medicare Other | Admitting: Nurse Practitioner

## 2020-12-20 VITALS — BP 122/72 | HR 68 | Resp 18 | Wt 168.9 lb

## 2020-12-20 DIAGNOSIS — R5381 Other malaise: Secondary | ICD-10-CM

## 2020-12-20 DIAGNOSIS — Z515 Encounter for palliative care: Secondary | ICD-10-CM

## 2020-12-20 NOTE — Progress Notes (Addendum)
[facility] Consult Note Telephone: [phone]  Fax: [phone]    Date of encounter: 12/20/20 3:38 PM PATIENT NAME: Virginia Clements [place] [id number]   [phone] (home)  DOB: 05-25-1929 MRN: [id number] PRIMARY CARE PROVIDER:    Dr Nona Dell [facility]  RESPONSIBLE PARTY:    Contact Information     Name Relation Home Work Mobile   [name] Sister [phone]        I met face to face with patient in facility. Palliative Care was asked to follow this patient by consultation request of  Dr Nona Dell to address advance care planning and complex medical decision making. This is a follow up visit.                                  ASSESSMENT AND PLAN / RECOMMENDATIONS: Symptom Management/Plan: ACP: DNR in place. Will put in vynca 2.  Debility secondary to deconditioning; continue to encourage mobility, self independence, continue OOB 3. Palliative care encounter; Palliative care encounter; Palliative medicine team will continue to support patient, patient's family, and medical team. Visit consisted of counseling and education dealing with the complex and emotionally intense issues of symptom management and palliative care in the setting of serious and potentially life-threatening illness 4. f/u 8 weeks for ongoing monitoring chronic disease progression, ongoing discussions complex medical decision making Follow up Palliative Care Visit: Palliative care will continue to follow for complex medical decision making, advance care planning, and clarification of goals. Return 8 weeks or prn.  I spent 62 minutes providing this consultation. More than 50% of the time in this consultation was spent in counseling and care coordination.  PPS: 30%  Chief Complaint: Follow up Palliative consult for complex medical decision making  HISTORY OF PRESENT ILLNESS:  Virginia Clements is a 87 y.o.  year old female  with multiple medical problems including Hypertension, wheelchair dependent, depression, protein calorie malnutrition. Virginia Clements resides at [facility] skilled nursing facility following Hospitalization that transitioned from short term rehab transitioned to long term care.  Virginia Clements does require assistance with transfers but is able to sit up in a wheelchair.  Virginia Clements does require assistance with bathing, dressing, toileting.  Virginia Clements does feed herself and appetite has been fair. At present, Virginia Clements is lying in bed, appears debilitated, comfortable. No visitors present. I visited and observed Virginia Clements. We talked about purpose of PC visit. Virginia Clements in agreement. We talked about how she has been feeling. Virginia Clements endorses she is doing well. We talked about ros, denied. We talked about mobility, getting oob which she declined. We talked about appetite, foods she likes, nutrition. We talked about the importance of social interactions, participating in activities at the facility, being oob. We talked about medical goals, reviewed. We talked about sleep pattern, hygiene. We talked about role pc in poc. Most pc visit supportive. Discussed with Virginia Clements to continue to encourage to advocate and empower self to be as independent as possible. I updated staff  History obtained from review of EMR, discussion with facility staff and [name].  I reviewed available labs, medications, imaging, studies and related documents from the EMR.  Records reviewed and summarized above.   ROS  Full 14 system review of systems performed and negative with exception of: as per HPI.   Physical Exam: Constitutional:  NAD General: obese, debilitated pleasant female EYES:  lids intact ENMT: oral mucous membranes moist CV: S1S2, RRR Pulmonary: clear, decrease bases, no increased work of breathing, no cough Abdomen: normo-active BS +  4 quadrants, soft and non tender MSK: bed bound Skin: warm and dry, no rashes or wounds on visible skin Neuro:  + generalized weakness,  no cognitive impairment Psych: non-anxious affect, A and O x 3  Questions and concerns were addressed. Provided general support and encouragement, no other unmet needs identified   Thank you for the opportunity to participate in the care of Virginia Clements.  The palliative care team will continue to follow. Please call our office at [phone] if we can be of additional assistance.  This chart was dictated using voice recognition software.  Despite best efforts to proofread,  errors can occur which can change the documentation meaning.   Ernesha Ramone Ihor Gully, NP

## 2021-01-10 ENCOUNTER — Non-Acute Institutional Stay: Payer: Medicare Other | Admitting: Nurse Practitioner

## 2021-01-10 ENCOUNTER — Encounter: Payer: Self-pay | Admitting: Nurse Practitioner

## 2021-01-10 ENCOUNTER — Other Ambulatory Visit: Payer: Self-pay

## 2021-01-10 VITALS — BP 130/81 | HR 82 | Temp 97.0°F | Resp 18 | Wt 166.9 lb

## 2021-01-10 DIAGNOSIS — Z515 Encounter for palliative care: Secondary | ICD-10-CM

## 2021-01-10 DIAGNOSIS — R5381 Other malaise: Secondary | ICD-10-CM

## 2021-01-10 NOTE — Progress Notes (Signed)
[facility] Palliative Care Consult Note Telephone: [phone]  Fax: [phone]    Date of encounter: 01/10/21 4:45 PM PATIENT NAME: Virginia Clements [place]  [id number]   [phone] (home)  DOB: 10-Feb-1930 MRN: [id number] PRIMARY CARE PROVIDER:   Dr [facility] Baxter Hire, MD,  [place] [address] [phone]  RESPONSIBLE PARTY:    Contact Information     Name Relation Home Work Mobile   [name] Sister [phone]        I met face to face with patient in facility. Palliative Care was asked to follow this patient by consultation request of  Dr Nicole Kindred to address advance care planning and complex medical decision making. This is a follow up visit.                                  ASSESSMENT AND PLAN / RECOMMENDATIONS: Symptom Management/Plan: ACP: DNR in place. Will put in vynca 2.  Debility secondary to deconditioning; continue to encourage mobility, self independence, Ms. Mcwhirter has been getting oob to w/c, continue to encourage, Ms. Forness in agreement 3. Palliative care encounter; Palliative care encounter; Palliative medicine team will continue to support patient, patient's family, and medical team. Visit consisted of counseling and education dealing with the complex and emotionally intense issues of symptom management and palliative care in the setting of serious and potentially life-threatening illness  Follow up Palliative Care Visit: Palliative care will continue to follow for complex medical decision making, advance care planning, and clarification of goals. Return 8 weeks or prn.  I spent 68 minutes providing this consultation starting at 2:40pm. More than 50% of the time in this consultation was spent in counseling and care coordination. PPS: 40%  Chief Complaint: Follow up palliative consult for complex medical decision  making  HISTORY OF PRESENT ILLNESS:  Virginia Clements is a 87 y.o. year old female  with multiple medical problems including hypertension, wheelchair dependent, depression, protein calorie malnutrition. Ms. Norby resides at [facility] skilled nursing facility following Hospitalization that transitioned from short term rehab transitioned to long term care.  Ms. Giarratano does require assistance with transfers but is able to sit up in a wheelchair.  Ms. Frandsen does require assistance with bathing, dressing, toileting.  Ms. Boline does feed herself and appetite has been fair. Ms. Herbison is on a regular diet regular texture, regular liquid consistency with medplus supplement. Staff endorses no new changes or concerns. At present Ms. Romick is lying in bed, appears comfortable. No visitors present. Ms. Parchment is smiling, laughing during pc visit. We talked about how she has been feeling. Ms. Dormer endorses today, "great". We talked about ros, all negative. We talked about her daily routine. We talked about food, what she likes to eat, nutrition. We talked about quality of life, reviewed medical goals. We talked about the things that bring her joy. At present time Ms. Moncayo appears stable. Will continue to f/u pc in 8 weeks. Most pc visit supportive, Ms. Ruggiero endorses appreciated the visit and support. I updated staff.   History obtained from review of EMR, discussion with facility staff and  Ms. Mcsorley.  I reviewed available labs, medications, imaging, studies and related documents from the EMR.  Records reviewed and summarized above.   ROS Full 10 system review of systems performed and negative with exception of: as  per HPI.   Physical Exam: Constitutional: NAD General: obese, debilitated, pleasant female EYES: lids intact ENMT:  oral mucous membranes moist CV: S1S2, RRR Pulmonary: LCTA, no increased work of breathing, no  cough, room air Abdomen: normo-active BS + 4 quadrants, soft and non tender MSK: w/c Skin: warm and dry, Neuro:  + generalized weakness Psych: non-anxious affect, A and O x 3  Questions and concerns were addressed.  Provided general support and encouragement, no other unmet needs identified   Thank you for the opportunity to participate in the care of Ms. Friscia.  The palliative care team will continue to follow. Please call our office at [phone] if we can be of additional assistance.   This chart was dictated using voice recognition software.  Despite best efforts to proofread,  errors can occur which can change the documentation meaning.   Lindie Roberson Ihor Gully, NP

## 2022-03-17 ENCOUNTER — Inpatient Hospital Stay
Admission: EM | Admit: 2022-03-17 | Discharge: 2022-03-21 | DRG: 177 | Disposition: A | Discharge status: Home Health | Payer: Medicare Other | Attending: Osteopathic Medicine | Admitting: Osteopathic Medicine

## 2022-03-17 ENCOUNTER — Emergency Department: Payer: Medicare Other

## 2022-03-17 DIAGNOSIS — Z885 Allergy status to narcotic agent status: Secondary | ICD-10-CM | POA: Diagnosis not present

## 2022-03-17 DIAGNOSIS — Z853 Personal history of malignant neoplasm of breast: Secondary | ICD-10-CM

## 2022-03-17 DIAGNOSIS — Z66 Do not resuscitate: Secondary | ICD-10-CM | POA: Diagnosis present

## 2022-03-17 DIAGNOSIS — Z888 Allergy status to other drugs, medicaments and biological substances status: Secondary | ICD-10-CM | POA: Diagnosis not present

## 2022-03-17 DIAGNOSIS — N390 Urinary tract infection, site not specified: Secondary | ICD-10-CM | POA: Diagnosis present

## 2022-03-17 DIAGNOSIS — Z7401 Bed confinement status: Secondary | ICD-10-CM | POA: Diagnosis not present

## 2022-03-17 DIAGNOSIS — F32A Depression, unspecified: Secondary | ICD-10-CM | POA: Diagnosis present

## 2022-03-17 DIAGNOSIS — J40 Bronchitis, not specified as acute or chronic: Secondary | ICD-10-CM | POA: Diagnosis present

## 2022-03-17 DIAGNOSIS — Z79899 Other long term (current) drug therapy: Secondary | ICD-10-CM

## 2022-03-17 DIAGNOSIS — Z923 Personal history of irradiation: Secondary | ICD-10-CM | POA: Diagnosis not present

## 2022-03-17 DIAGNOSIS — N3001 Acute cystitis with hematuria: Secondary | ICD-10-CM

## 2022-03-17 DIAGNOSIS — Z23 Encounter for immunization: Secondary | ICD-10-CM | POA: Diagnosis present

## 2022-03-17 DIAGNOSIS — B9689 Other specified bacterial agents as the cause of diseases classified elsewhere: Secondary | ICD-10-CM | POA: Diagnosis present

## 2022-03-17 DIAGNOSIS — Z87891 Personal history of nicotine dependence: Secondary | ICD-10-CM | POA: Diagnosis not present

## 2022-03-17 DIAGNOSIS — G9341 Metabolic encephalopathy: Secondary | ICD-10-CM | POA: Diagnosis present

## 2022-03-17 DIAGNOSIS — U071 COVID-19: Principal | ICD-10-CM

## 2022-03-17 DIAGNOSIS — Z993 Dependence on wheelchair: Secondary | ICD-10-CM | POA: Diagnosis not present

## 2022-03-17 DIAGNOSIS — R4182 Altered mental status, unspecified: Secondary | ICD-10-CM

## 2022-03-17 DIAGNOSIS — Z8249 Family history of ischemic heart disease and other diseases of the circulatory system: Secondary | ICD-10-CM

## 2022-03-17 DIAGNOSIS — J9601 Acute respiratory failure with hypoxia: Secondary | ICD-10-CM | POA: Diagnosis present

## 2022-03-17 DIAGNOSIS — I10 Essential (primary) hypertension: Secondary | ICD-10-CM | POA: Diagnosis present

## 2022-03-17 DIAGNOSIS — I480 Paroxysmal atrial fibrillation: Secondary | ICD-10-CM | POA: Diagnosis present

## 2022-03-17 LAB — CBC WITH DIFFERENTIAL/PLATELET
Abs Immature Granulocytes: 0.01 10*3/uL (ref 0.00–0.07)
Basophils Absolute: 0 10*3/uL (ref 0.0–0.1)
Basophils Relative: 0 %
Eosinophils Absolute: 0.3 10*3/uL (ref 0.0–0.5)
Eosinophils Relative: 4 %
HCT: 36 % (ref 36.0–46.0)
Hemoglobin: 11.7 g/dL — ABNORMAL LOW (ref 12.0–15.0)
Immature Granulocytes: 0 %
Lymphocytes Relative: 25 %
Lymphs Abs: 2 10*3/uL (ref 0.7–4.0)
MCH: 30.9 pg (ref 26.0–34.0)
MCHC: 32.5 g/dL (ref 30.0–36.0)
MCV: 95 fL (ref 80.0–100.0)
Monocytes Absolute: 0.6 10*3/uL (ref 0.1–1.0)
Monocytes Relative: 8 %
Neutro Abs: 5 10*3/uL (ref 1.7–7.7)
Neutrophils Relative %: 63 %
Platelets: 187 10*3/uL (ref 150–400)
RBC: 3.79 MIL/uL — ABNORMAL LOW (ref 3.87–5.11)
RDW: 12.7 % (ref 11.5–15.5)
WBC: 8 10*3/uL (ref 4.0–10.5)
nRBC: 0 % (ref 0.0–0.2)

## 2022-03-17 LAB — URINALYSIS, ROUTINE W REFLEX MICROSCOPIC
Bacteria, UA: NONE SEEN
Bilirubin Urine: NEGATIVE
Glucose, UA: NEGATIVE mg/dL
Ketones, ur: NEGATIVE mg/dL
Leukocytes,Ua: NEGATIVE
Nitrite: NEGATIVE
Protein, ur: NEGATIVE mg/dL
RBC / HPF: >50 RBC/hpf — ABNORMAL HIGH (ref 0–5)
Specific Gravity, Urine: 1.013 (ref 1.005–1.030)
pH: 5 (ref 5.0–8.0)

## 2022-03-17 LAB — TROPONIN I (HIGH SENSITIVITY)
Troponin I (High Sensitivity): 6 ng/L (ref <18)
Troponin I (High Sensitivity): 6 ng/L (ref <18)

## 2022-03-17 LAB — COMPREHENSIVE METABOLIC PANEL
ALT: 18 U/L (ref 0–44)
AST: 17 U/L (ref 15–41)
Albumin: 3.1 g/dL — ABNORMAL LOW (ref 3.5–5.0)
Alkaline Phosphatase: 59 U/L (ref 38–126)
Anion gap: 6 (ref 5–15)
BUN: 16 mg/dL (ref 8–23)
CO2: 28 mmol/L (ref 22–32)
Calcium: 10 mg/dL (ref 8.9–10.3)
Chloride: 97 mmol/L — ABNORMAL LOW (ref 98–111)
Creatinine, Ser: 0.75 mg/dL (ref 0.44–1.00)
GFR, Estimated: >60 mL/min (ref >60)
Glucose, Bld: 104 mg/dL — ABNORMAL HIGH (ref 70–99)
Potassium: 4.7 mmol/L (ref 3.5–5.1)
Sodium: 131 mmol/L — ABNORMAL LOW (ref 135–145)
Total Bilirubin: 0.6 mg/dL (ref 0.3–1.2)
Total Protein: 6.1 g/dL — ABNORMAL LOW (ref 6.5–8.1)

## 2022-03-17 LAB — C-REACTIVE PROTEIN: CRP: 5.6 mg/dL — ABNORMAL HIGH (ref <1.0)

## 2022-03-17 LAB — RESP PANEL BY RT-PCR (RSV, FLU A&B, COVID)  RVPGX2
Influenza A by PCR: NEGATIVE
Influenza B by PCR: NEGATIVE
Resp Syncytial Virus by PCR: NEGATIVE
SARS Coronavirus 2 by RT PCR: POSITIVE — AB

## 2022-03-17 LAB — PROCALCITONIN: Procalcitonin: <0.1 ng/mL

## 2022-03-17 LAB — FERRITIN: Ferritin: 177 ng/mL (ref 11–307)

## 2022-03-17 LAB — LACTIC ACID, PLASMA: Lactic Acid, Venous: 0.6 mmol/L (ref 0.5–1.9)

## 2022-03-17 LAB — BRAIN NATRIURETIC PEPTIDE: B Natriuretic Peptide: 84.2 pg/mL (ref 0.0–100.0)

## 2022-03-17 LAB — LACTATE DEHYDROGENASE: LDH: 130 U/L (ref 98–192)

## 2022-03-17 LAB — D-DIMER, QUANTITATIVE: D-Dimer, Quant: 0.45 ug/mL-FEU (ref 0.00–0.50)

## 2022-03-17 MED ORDER — HYDRALAZINE HCL 20 MG/ML IJ SOLN: 5.0000 mg | INTRAMUSCULAR | Status: DC | PRN

## 2022-03-17 MED ORDER — ENOXAPARIN SODIUM 40 MG/0.4ML IJ SOSY
40.0000 mg | PREFILLED_SYRINGE | INTRAMUSCULAR | Status: DC
  Administered 2022-03-17 – 2022-03-20 (×4): 40 mg via SUBCUTANEOUS
  Filled 2022-03-17 (×4): qty 0.4

## 2022-03-17 MED ORDER — MAGNESIUM HYDROXIDE 400 MG/5ML PO SUSP: 30.0000 mL | Freq: Every day | ORAL | Status: DC | PRN

## 2022-03-17 MED ORDER — POLYETHYLENE GLYCOL 3350 17 G PO PACK: 17.0000 g | PACK | Freq: Every day | ORAL | Status: DC | PRN

## 2022-03-17 MED ORDER — NIRMATRELVIR/RITONAVIR (PAXLOVID)TABLET: 3.0000 | ORAL_TABLET | Freq: Two times a day (BID) | ORAL | Status: DC

## 2022-03-17 MED ORDER — AMLODIPINE BESYLATE 10 MG PO TABS
10.0000 mg | ORAL_TABLET | Freq: Every day | ORAL | Status: DC
  Administered 2022-03-17 – 2022-03-21 (×5): 10 mg via ORAL
  Filled 2022-03-17: qty 2
  Filled 2022-03-17 (×4): qty 1

## 2022-03-17 MED ORDER — ACETAMINOPHEN 325 MG PO TABS: 650.0000 mg | ORAL_TABLET | Freq: Four times a day (QID) | ORAL | Status: DC | PRN

## 2022-03-17 MED ORDER — BUSPIRONE HCL 10 MG PO TABS
10.0000 mg | ORAL_TABLET | Freq: Two times a day (BID) | ORAL | Status: DC
  Administered 2022-03-17 – 2022-03-21 (×9): 10 mg via ORAL
  Filled 2022-03-17 (×8): qty 1
  Filled 2022-03-17: qty 2

## 2022-03-17 MED ORDER — VITAMIN C 500 MG PO TABS
500.0000 mg | ORAL_TABLET | Freq: Every day | ORAL | Status: DC
  Administered 2022-03-17 – 2022-03-21 (×5): 500 mg via ORAL
  Filled 2022-03-17 (×5): qty 1

## 2022-03-17 MED ORDER — IPRATROPIUM BROMIDE HFA 17 MCG/ACT IN AERS
2.0000 | INHALATION_SPRAY | RESPIRATORY_TRACT | Status: DC
  Administered 2022-03-17 – 2022-03-21 (×21): 2 via RESPIRATORY_TRACT
  Filled 2022-03-17: qty 12.9

## 2022-03-17 MED ORDER — HYDROCOD POLI-CHLORPHE POLI ER 10-8 MG/5ML PO SUER: 5.0000 mL | Freq: Two times a day (BID) | ORAL | Status: DC | PRN

## 2022-03-17 MED ORDER — GUAIFENESIN-DM 100-10 MG/5ML PO SYRP
10.0000 mL | ORAL_SOLUTION | ORAL | Status: DC | PRN
  Administered 2022-03-17: 10 mL via ORAL
  Filled 2022-03-17: qty 10

## 2022-03-17 MED ORDER — NIRMATRELVIR/RITONAVIR (PAXLOVID) TABLET (RENAL DOSING)
2.0000 | ORAL_TABLET | Freq: Two times a day (BID) | ORAL | Status: DC
  Administered 2022-03-17 – 2022-03-21 (×9): 2 via ORAL
  Filled 2022-03-17: qty 20

## 2022-03-17 MED ORDER — ONDANSETRON HCL 4 MG PO TABS: 4.0000 mg | ORAL_TABLET | Freq: Four times a day (QID) | ORAL | Status: DC | PRN

## 2022-03-17 MED ORDER — PREDNISONE 50 MG PO TABS
50.0000 mg | ORAL_TABLET | Freq: Every day | ORAL | Status: AC
  Administered 2022-03-20 – 2022-03-21 (×2): 50 mg via ORAL
  Filled 2022-03-17 (×2): qty 1

## 2022-03-17 MED ORDER — ALBUTEROL SULFATE HFA 108 (90 BASE) MCG/ACT IN AERS: 2.0000 | INHALATION_SPRAY | RESPIRATORY_TRACT | Status: DC | PRN

## 2022-03-17 MED ORDER — ONDANSETRON HCL 4 MG/2ML IJ SOLN: 4.0000 mg | Freq: Four times a day (QID) | INTRAMUSCULAR | Status: DC | PRN

## 2022-03-17 MED ORDER — SODIUM CHLORIDE 0.9 % IV SOLN: INTRAVENOUS | Status: DC

## 2022-03-17 MED ORDER — FAMOTIDINE 20 MG PO TABS
20.0000 mg | ORAL_TABLET | Freq: Two times a day (BID) | ORAL | Status: DC
  Administered 2022-03-17 – 2022-03-21 (×9): 20 mg via ORAL
  Filled 2022-03-17 (×9): qty 1

## 2022-03-17 MED ORDER — SODIUM CHLORIDE 0.9 % IV SOLN
1.0000 g | INTRAVENOUS | Status: DC
  Administered 2022-03-17 – 2022-03-20 (×4): 1 g via INTRAVENOUS
  Filled 2022-03-17 (×2): qty 10
  Filled 2022-03-17: qty 1
  Filled 2022-03-17: qty 10

## 2022-03-17 MED ORDER — METOPROLOL TARTRATE 25 MG PO TABS
25.0000 mg | ORAL_TABLET | Freq: Two times a day (BID) | ORAL | Status: DC
  Administered 2022-03-17 – 2022-03-20 (×7): 25 mg via ORAL
  Filled 2022-03-17 (×7): qty 1

## 2022-03-17 MED ORDER — METHYLPREDNISOLONE SODIUM SUCC 125 MG IJ SOLR
1.0000 mg/kg | Freq: Two times a day (BID) | INTRAMUSCULAR | Status: AC
  Administered 2022-03-17 – 2022-03-19 (×6): 75 mg via INTRAVENOUS
  Filled 2022-03-17 (×6): qty 2

## 2022-03-17 MED ORDER — ZINC SULFATE 220 (50 ZN) MG PO CAPS
220.0000 mg | ORAL_CAPSULE | Freq: Every day | ORAL | Status: DC
  Administered 2022-03-17 – 2022-03-21 (×5): 220 mg via ORAL
  Filled 2022-03-17 (×5): qty 1

## 2022-03-17 MED ORDER — ENALAPRIL MALEATE 10 MG PO TABS
10.0000 mg | ORAL_TABLET | Freq: Every day | ORAL | Status: DC
  Administered 2022-03-17 – 2022-03-21 (×5): 10 mg via ORAL
  Filled 2022-03-17 (×5): qty 1

## 2022-03-17 MED ORDER — TIMOLOL MALEATE 0.5 % OP SOLN
1.0000 [drp] | Freq: Every day | OPHTHALMIC | Status: DC
  Administered 2022-03-18 – 2022-03-20 (×3): 1 [drp] via OPHTHALMIC
  Filled 2022-03-17: qty 5

## 2022-03-17 NOTE — H&P (Addendum)
History and Physical    [name]:[id number] DOB: [birth year] DOA: 03/17/2022  Referring MD/NP/PA:   PCP: Baxter Hire, MD   Patient coming from:  The patient is coming from home.    Chief Complaint: SOB and cough  HPI: Virginia Clements is a 88 y.o. female with medical history significant of wheelchair-bound, hypertension, depression, breast cancer (s/p of radiation therapy), who presents with cough and shortness breath.  Patient states that she has been sick for about 4 days.  She has cough with little mucus production, shortness breath, which has been progressively worsening.  No fever or chills.  Denies chest pain.  No nausea vomiting, diarrhea or abdominal pain. She also reports burning on urination.  No dysuria or hematuria.  Patient is normally not using oxygen at home.   She was found to have oxygen desaturation to 84% on room air, which improved to 97% on 2 L oxygen.  At her normal baseline, patient is alert, oriented x 3 per her brother-in-law (I called him by phone).  Per ED physician, patient had confusion earlier, which seem to have resolved.  When I saw patient in ED, she is alert, oriented x 3.  She answered all questions appropriately.  She moves all extremities normally.  Data reviewed independently and ED Course: pt was found to have positive COVID PCR, D-dimer 0.45, troponin level 6 --> 6, BNP 84.2, lactic acid 0.6.  Urinalysis (hazy appearance, negative leukocyte, negative bacteria, WBC 11-20).  Temperature normal, blood pressure 139/61, heart rate 65, RR 17.  Chest x-ray showed bronchitis change.  CT of head is negative.  Patient is admitted to telemetry bed as inpatient.   EKG: I have personally reviewed.  Sinus rhythm, QTc 413, LAD, nonspecific T wave change.   Review of Systems:   General: no fevers, chills, no body weight gain, has fatigue HEENT: no blurry vision, hearing changes or sore throat Respiratory: has dyspnea, coughing, no  wheezing CV: no chest pain, no palpitations GI: no nausea, vomiting, abdominal pain, diarrhea, constipation GU: no dysuria, has burning on urination, no increased urinary frequency, hematuria  Ext: has leg edema Neuro: no unilateral weakness, numbness, or tingling, no vision change or hearing loss Skin: no rash, no skin tear. MSK: No muscle spasm, no deformity, no limitation of range of movement in spin Heme: No easy bruising.  Travel history: No recent long distant travel.   Allergy:  Allergies  Allergen Reactions   Alendronate Other (See Comments)    Nervousness and shakiness per pt   Codeine     dizziness    Past Medical History:  Diagnosis Date   Breast cancer ([place])    Hypertension    Hypoglycemia    Radiation 2009    Past Surgical History:  Procedure Laterality Date   BREAST BIOPSY Right 2009    Social History:  reports that she has quit smoking. She has never used smokeless tobacco. She reports that she does not currently use alcohol. She reports that she does not use drugs.  Family History:  Family History  Problem Relation Age of Onset   Hypertension Mother      Prior to Admission medications   Medication Sig Start Date End Date Taking? Authorizing Provider  amLODipine (NORVASC) 10 MG tablet Take 1 tablet (10 mg total) by mouth daily. 06/05/18  Yes [name], MD  busPIRone (BUSPAR) 10 MG tablet Take 10 mg by mouth 2 (two) times daily.   Yes [provider]  enalapril (VASOTEC) 10 MG tablet Take 10 mg by mouth daily.   Yes [provider]  HYDROcodone bit-homatropine (HYCODAN) 5-1.5 MG/5ML syrup Take 5 mLs by mouth every 6 (six) hours as needed. 03/14/22  Yes [provider]  metoprolol tartrate (LOPRESSOR) 25 MG tablet Take 1 tablet (25 mg total) by mouth 2 (two) times daily. 06/04/18  Yes [name], MD  naproxen (NAPROSYN) 375 MG tablet Take 1 tablet (375 mg total) by mouth 2 (two) times daily as needed for  mild pain or moderate pain. 08/29/20  Yes Dwyane Dee, MD  timolol (TIMOPTIC-XR) 0.5 % ophthalmic gel-forming Place 1 drop into both eyes at bedtime. 07/06/14  Yes [provider]  acetaminophen (TYLENOL) 325 MG tablet Take 2 tablets (650 mg total) by mouth every 6 (six) hours as needed for mild pain (or Fever >/= 101). Patient not taking: Reported on 03/17/2022 06/04/18   [name], MD  mirtazapine (REMERON) 7.5 MG tablet Take 7.5 mg by mouth at bedtime. Patient not taking: Reported on 03/17/2022    [provider]  polyethylene glycol (MIRALAX / GLYCOLAX) 17 g packet Take 17 g by mouth daily as needed for mild constipation or moderate constipation. 08/29/20   Dwyane Dee, MD  senna-docusate (SENOKOT-S) 8.6-50 MG tablet Take 1 tablet by mouth 2 (two) times daily. Patient not taking: Reported on 03/17/2022 08/29/20   Dwyane Dee, MD    Physical Exam: Vitals:   03/17/22 1500 03/17/22 1515 03/17/22 1600 03/17/22 1630  BP: 126/71  (!) 126/55 (!) 119/56  Pulse: 64 62    Resp: '15 18 11 13  '$ Temp:      TempSrc:      SpO2: 98% 97%    Weight:      Height:       General: Not in acute distress HEENT:       Eyes: PERRL, EOMI, no scleral icterus.       ENT: No discharge from the ears and nose, no pharynx injection, no tonsillar enlargement.        Neck: No JVD, no bruit, no mass felt. Heme: No neck lymph node enlargement. Cardiac: S1/S2, RRR, No murmurs, No gallops or rubs. Respiratory: No rales, wheezing, rhonchi or rubs. GI: Soft, nondistended, nontender, no rebound pain, no organomegaly, BS present. GU: No hematuria Ext: 1+ pitting leg edema bilaterally. 1+DP/PT pulse bilaterally. Musculoskeletal: No joint deformities, No joint redness or warmth, no limitation of ROM in spin. Skin: No rashes.  Neuro: Alert, oriented X3, cranial nerves II-XII grossly intact, moves all extremities. Psych: Patient is not psychotic, no suicidal or hemocidal ideation.  Labs on  Admission: I have personally reviewed following labs and imaging studies  CBC: Recent Labs  Lab 03/17/22 0250  WBC 8.0  NEUTROABS 5.0  HGB 11.7*  HCT 36.0  MCV 95.0  PLT 209   Basic Metabolic Panel: Recent Labs  Lab 03/17/22 0250  NA 131*  K 4.7  CL 97*  CO2 28  GLUCOSE 104*  BUN 16  CREATININE 0.75  CALCIUM 10.0   GFR: Estimated Creatinine Clearance: 47.3 mL/min (by C-G formula based on SCr of 0.75 mg/dL). Liver Function Tests: Recent Labs  Lab 03/17/22 0250  AST 17  ALT 18  ALKPHOS 59  BILITOT 0.6  PROT 6.1*  ALBUMIN 3.1*   No results for input(s): "LIPASE", "AMYLASE" in the last 168 hours. No results for input(s): "AMMONIA" in the last 168 hours. Coagulation Profile: No results for input(s): "INR", "PROTIME" in the last  168 hours. Cardiac Enzymes: No results for input(s): "CKTOTAL", "CKMB", "CKMBINDEX", "TROPONINI" in the last 168 hours. BNP (last 3 results) No results for input(s): "PROBNP" in the last 8760 hours. HbA1C: No results for input(s): "HGBA1C" in the last 72 hours. CBG: No results for input(s): "GLUCAP" in the last 168 hours. Lipid Profile: No results for input(s): "CHOL", "HDL", "LDLCALC", "TRIG", "CHOLHDL", "LDLDIRECT" in the last 72 hours. Thyroid Function Tests: No results for input(s): "TSH", "T4TOTAL", "FREET4", "T3FREE", "THYROIDAB" in the last 72 hours. Anemia Panel: Recent Labs    03/17/22 0618  FERRITIN 177   Urine analysis:    Component Value Date/Time   COLORURINE YELLOW (A) 03/17/2022 0250   APPEARANCEUR HAZY (A) 03/17/2022 0250   APPEARANCEUR Clear 08/10/2011 0226   LABSPEC 1.013 03/17/2022 0250   LABSPEC 1.025 08/10/2011 0226   PHURINE 5.0 03/17/2022 0250   GLUCOSEU NEGATIVE 03/17/2022 0250   GLUCOSEU Negative 08/10/2011 0226   HGBUR MODERATE (A) 03/17/2022 0250   BILIRUBINUR NEGATIVE 03/17/2022 0250   BILIRUBINUR Negative 08/10/2011 0226   KETONESUR NEGATIVE 03/17/2022 0250   PROTEINUR NEGATIVE 03/17/2022 0250    NITRITE NEGATIVE 03/17/2022 0250   LEUKOCYTESUR NEGATIVE 03/17/2022 0250   LEUKOCYTESUR Negative 08/10/2011 0226   Sepsis Labs: '@LABRCNTIP'$ (procalcitonin:4,lacticidven:4) ) Recent Results (from the past 240 hour(s))  Resp panel by RT-PCR (RSV, Flu A&B, Covid) Anterior Nasal Swab     Status: Abnormal   Collection Time: 03/17/22  2:51 AM   Specimen: Anterior Nasal Swab  Result Value Ref Range Status   SARS Coronavirus 2 by RT PCR POSITIVE (A) NEGATIVE Final   Influenza A by PCR NEGATIVE NEGATIVE Final   Influenza B by PCR NEGATIVE NEGATIVE Final   Resp Syncytial Virus by PCR NEGATIVE NEGATIVE Final    Comment: Performed at [facility], [address], [place], [place] [address]     Radiological Exams on Admission: CT HEAD WO CONTRAST (5MM)  Result Date: 03/17/2022 CLINICAL DATA:  Mental status change with unknown cause EXAM: CT HEAD WITHOUT CONTRAST TECHNIQUE: Contiguous axial images were obtained from the base of the skull through the vertex without intravenous contrast. RADIATION DOSE REDUCTION: This exam was performed according to the departmental dose-optimization program which includes automated exposure control, adjustment of the mA and/or kV according to patient size and/or use of iterative reconstruction technique. COMPARISON:  06/15/2020 FINDINGS: Brain: No evidence of acute infarction, hemorrhage, hydrocephalus, extra-axial collection or mass lesion/mass effect. Generalized atrophy with variable sulcal widening that is unchanged in pattern. Confluent chronic small vessel ischemia in the deep cerebral white matter. Vascular: No hyperdense vessel or unexpected calcification. Skull: Normal. Negative for fracture or focal lesion. Sinuses/Orbits: Patchy bilateral sinus opacification with left maxillary, bilateral sphenoid, and frontal sinus fluid levels. IMPRESSION: 1. No acute intracranial finding. Brain atrophy and chronic small vessel ischemia. 2. Active bilateral sinusitis  with fluid levels. Electronically Signed   By: Jorje Guild M.D.   On: 03/17/2022 04:39   DG Chest Portable 1 View  Result Date: 03/17/2022 CLINICAL DATA:  Dyspnea.  Difficulty breathing. EXAM: PORTABLE CHEST 1 VIEW COMPARISON:  06/15/2020 FINDINGS: Shallow inspiration. Cardiac enlargement. Peribronchial thickening with perihilar interstitial changes similar to prior study, likely representing chronic bronchitis and fibrosis. No developing consolidation or airspace disease. No pleural effusions. No pneumothorax. Mediastinal contours appear intact. Calcification of the aorta. IMPRESSION: Peribronchial thickening with central perihilar interstitial changes similar to prior study, likely chronic bronchitic changes and chronic fibrosis. No developing consolidation. Electronically Signed   By: Lucienne Capers  M.D.   On: 03/17/2022 03:23      Assessment/Plan Principal Problem:   Acute hypoxemic respiratory failure due to COVID-19 [facility]) Active Problems:   Benign essential HTN   UTI (urinary tract infection)   Depression   Acute metabolic encephalopathy   Assessment and Plan:  Acute hypoxemic respiratory failure due to COVID-19 [facility]): Patient has 2 L new oxygen requirement. -Admit to telemetry bed as inpatient -Started Paxlovid -check CRP -Bronchodilators -As needed Robitussin -Patient was given 75 mg of Solu-Medrol, that prednisone 50 mg daily -check BNP due to leg edema --> 84.2  Benign essential HTN -IV hydralazine as needed -Metoprolol, amlodipine, enalapril  UTI (urinary tract infection) -IV Rocephin -Follow-up urine culture  Depression -Continue home medication  Acute metabolic encephalopathy: CT head negative.  This issue has resolved.  Likely due to UTI -Frequent neurocheck     DVT ppx: SQ Lovenox  Code Status: DNR per pt and her sister  Family Communication: I called her brother-in-law and sister  Disposition Plan:  Anticipate discharge back to previous  environment  Consults called: None  Admission status and Level of care: Telemetry Medical:   as inpt    Dispo: The patient is from: Home              Anticipated d/c is to: Home              Anticipated d/c date is: 2 days              Patient currently is not medically stable to d/c.    Severity of Illness:  The appropriate patient status for this patient is INPATIENT. Inpatient status is judged to be reasonable and necessary in order to provide the required intensity of service to ensure the patient's safety. The patient's presenting symptoms, physical exam findings, and initial radiographic and laboratory data in the context of their chronic comorbidities is felt to place them at high risk for further clinical deterioration. Furthermore, it is not anticipated that the patient will be medically stable for discharge from the hospital within 2 midnights of admission.   * I certify that at the point of admission it is my clinical judgment that the patient will require inpatient hospital care spanning beyond 2 midnights from the point of admission due to high intensity of service, high risk for further deterioration and high frequency of surveillance required.*       Date of Service 03/17/2022    [facility]   If 7PM-7AM, please contact night-coverage [email or web address] 03/17/2022, 5:55 PM

## 2022-03-17 NOTE — ED Notes (Signed)
This RN at bedside, pt ripped off all leads and ripped out IV.

## 2022-03-17 NOTE — ED Provider Notes (Signed)
[facility] Provider Note    Event Date/Time   First MD Initiated Contact with Patient 03/17/22 [id number]     (approximate)   History   Shortness of Breath   HPI  Virginia Clements is a 88 y.o. female with history of hypertension, breast cancer who presents to the emergency department EMS from home where she lives with her sister for concerns for cough, congestion and difficulty breathing.  Patient is bedbound for the past 3 years and lives with her sister and sister's husband. Sister reports she has had cough and congestion for 4 days.  Was prescribed hydrocodone by her PCP but has not been on antibiotics.  EMS found her to be 84% on room air.  She does not wear oxygen chronically.  Improved on nonrebreather.  Patient tells me that she hurts all over.  She is unable to tell me how long this has been going on.  Also told nursing staff that she had burning with urination.  Patient is confused here.  Family reports this is abnormal.  Patient is reportedly "sharp as attack".   History provided by patient's sister Virginia Clements by phone, EMS.  Level 5 caveat secondary to altered mental status.    Past Medical History:  Diagnosis Date   Breast cancer ([place])    Hypertension    Hypoglycemia    Radiation 2009    Past Surgical History:  Procedure Laterality Date   BREAST BIOPSY Right 2009    MEDICATIONS:  Prior to Admission medications   Medication Sig Start Date End Date Taking? Authorizing Provider  acetaminophen (TYLENOL) 325 MG tablet Take 2 tablets (650 mg total) by mouth every 6 (six) hours as needed for mild pain (or Fever >/= 101). 06/04/18   [name], MD  amLODipine (NORVASC) 10 MG tablet Take 1 tablet (10 mg total) by mouth daily. 06/05/18   [name], MD  enalapril (VASOTEC) 10 MG tablet Take 10 mg by mouth daily.    [provider]  metoprolol tartrate (LOPRESSOR) 25 MG tablet Take 1 tablet (25 mg total) by mouth 2 (two)  times daily. 06/04/18   [name], MD  mirtazapine (REMERON) 7.5 MG tablet Take 7.5 mg by mouth at bedtime.    [provider]  naproxen (NAPROSYN) 375 MG tablet Take 1 tablet (375 mg total) by mouth 2 (two) times daily as needed for mild pain or moderate pain. 08/29/20   Dwyane Dee, MD  polyethylene glycol (MIRALAX / GLYCOLAX) 17 g packet Take 17 g by mouth daily as needed for mild constipation or moderate constipation. 08/29/20   Dwyane Dee, MD  senna-docusate (SENOKOT-S) 8.6-50 MG tablet Take 1 tablet by mouth 2 (two) times daily. 08/29/20   Dwyane Dee, MD  timolol (TIMOPTIC-XR) 0.5 % ophthalmic gel-forming Place 1 drop into both eyes at bedtime. 07/06/14   [provider]    Physical Exam   Triage Vital Signs: ED Triage Vitals  Enc Vitals Group     BP 03/17/22 0248 138/70     Pulse Rate 03/17/22 0248 63     Resp 03/17/22 0248 20     Temp 03/17/22 0248 98 F (36.7 C)     Temp Source 03/17/22 0248 Oral     SpO2 03/17/22 0248 93 %     Weight 03/17/22 0244 165 lb (74.8 kg)     Height 03/17/22 0244 '5\' 5"'$  (1.651 m)     Head Circumference --      Peak Flow --  Pain Score 03/17/22 0243 0     Pain Loc --      Pain Edu? --      Excl. in [place]? --     Most recent vital signs: Vitals:   03/17/22 0248 03/17/22 0311  BP: 138/70   Pulse: 63   Resp: 20   Temp: 98 F (36.7 C) 98.3 F (36.8 C)  SpO2: 93%     CONSTITUTIONAL: Alert and oriented to person only.  Elderly.  Obese. HEAD: Normocephalic, atraumatic EYES: Conjunctivae clear, pupils appear equal, sclera nonicteric ENT: normal nose; moist mucous membranes NECK: Supple, normal ROM CARD: RRR; S1 and S2 appreciated; no murmurs, no clicks, no rubs, no gallops RESP: Slightly diminished aeration at bases bilaterally without rhonchi, wheezing or rales.  Sats 84% on room air with EMS.  Currently on nasal cannula. ABD/GI: Normal bowel sounds; non-distended; soft, non-tender, no rebound, no  guarding, no peritoneal signs BACK: The back appears normal EXT: Normal ROM in all joints; no deformity noted, no edema; no cyanosis, no calf tenderness or calf swelling SKIN: Normal color for age and race; warm; no rash on exposed skin NEURO: Moves all extremities equally, normal speech, no facial asymmetry PSYCH: The patient's mood and manner are appropriate.   ED Results / Procedures / Treatments   LABS: (all labs ordered are listed, but only abnormal results are displayed) Labs Reviewed  RESP PANEL BY RT-PCR (RSV, FLU A&B, COVID)  RVPGX2 - Abnormal; Notable for the following components:      Result Value   SARS Coronavirus 2 by RT PCR POSITIVE (*)    All other components within normal limits  CBC WITH DIFFERENTIAL/PLATELET - Abnormal; Notable for the following components:   RBC 3.79 (*)    Hemoglobin 11.7 (*)    All other components within normal limits  COMPREHENSIVE METABOLIC PANEL - Abnormal; Notable for the following components:   Sodium 131 (*)    Chloride 97 (*)    Glucose, Bld 104 (*)    Total Protein 6.1 (*)    Albumin 3.1 (*)    All other components within normal limits  URINALYSIS, ROUTINE W REFLEX MICROSCOPIC - Abnormal; Notable for the following components:   Color, Urine YELLOW (*)    APPearance HAZY (*)    Hgb urine dipstick MODERATE (*)    RBC / HPF >50 (*)    All other components within normal limits  URINE CULTURE  LACTIC ACID, PLASMA  D-DIMER, QUANTITATIVE  TROPONIN I (HIGH SENSITIVITY)  TROPONIN I (HIGH SENSITIVITY)     EKG:  EKG Interpretation  Date/Time:  Friday March 17 2022 02:55:08 EST Ventricular Rate:  63 PR Interval:  184 QRS Duration: 90 QT Interval:  403 QTC Calculation: 413 R Axis:   -24 Text Interpretation: Sinus rhythm Abnormal R-wave progression, early transition Left ventricular hypertrophy Confirmed by Pryor Curia [id number]) on 03/17/2022 3:43:07 AM         RADIOLOGY: My personal review and interpretation of imaging:  CT head unremarkable.  Chest x-ray shows bronchitis.  I have personally reviewed all radiology reports.   CT HEAD WO CONTRAST (5MM)  Result Date: 03/17/2022 CLINICAL DATA:  Mental status change with unknown cause EXAM: CT HEAD WITHOUT CONTRAST TECHNIQUE: Contiguous axial images were obtained from the base of the skull through the vertex without intravenous contrast. RADIATION DOSE REDUCTION: This exam was performed according to the departmental dose-optimization program which includes automated exposure control, adjustment of the mA and/or kV according to patient size and/or  use of iterative reconstruction technique. COMPARISON:  06/15/2020 FINDINGS: Brain: No evidence of acute infarction, hemorrhage, hydrocephalus, extra-axial collection or mass lesion/mass effect. Generalized atrophy with variable sulcal widening that is unchanged in pattern. Confluent chronic small vessel ischemia in the deep cerebral white matter. Vascular: No hyperdense vessel or unexpected calcification. Skull: Normal. Negative for fracture or focal lesion. Sinuses/Orbits: Patchy bilateral sinus opacification with left maxillary, bilateral sphenoid, and frontal sinus fluid levels. IMPRESSION: 1. No acute intracranial finding. Brain atrophy and chronic small vessel ischemia. 2. Active bilateral sinusitis with fluid levels. Electronically Signed   By: Jorje Guild M.D.   On: 03/17/2022 04:39   DG Chest Portable 1 View  Result Date: 03/17/2022 CLINICAL DATA:  Dyspnea.  Difficulty breathing. EXAM: PORTABLE CHEST 1 VIEW COMPARISON:  06/15/2020 FINDINGS: Shallow inspiration. Cardiac enlargement. Peribronchial thickening with perihilar interstitial changes similar to prior study, likely representing chronic bronchitis and fibrosis. No developing consolidation or airspace disease. No pleural effusions. No pneumothorax. Mediastinal contours appear intact. Calcification of the aorta. IMPRESSION: Peribronchial thickening with central  perihilar interstitial changes similar to prior study, likely chronic bronchitic changes and chronic fibrosis. No developing consolidation. Electronically Signed   By: Lucienne Capers M.D.   On: 03/17/2022 03:23     PROCEDURES:  Critical Care performed: Yes, see critical care procedure note(s)   CRITICAL CARE Performed by: Cyril Mourning Kennie Karapetian   Total critical care time: 40 minutes  Critical care time was exclusive of separately billable procedures and treating other patients.  Critical care was necessary to treat or prevent imminent or life-threatening deterioration.  Critical care was time spent personally by me on the following activities: development of treatment plan with patient and/or surrogate as well as nursing, discussions with consultants, evaluation of patient's response to treatment, examination of patient, obtaining history from patient or surrogate, ordering and performing treatments and interventions, ordering and review of laboratory studies, ordering and review of radiographic studies, pulse oximetry and re-evaluation of patient's condition.   Marland Kitchen1-3 Lead EKG Interpretation  Performed by: Zemira Zehring, Delice Bison, DO Authorized by: Nihal Doan, Delice Bison, DO     Interpretation: normal     ECG rate:  63   ECG rate assessment: normal     Rhythm: sinus rhythm     Ectopy: none     Conduction: normal       IMPRESSION / MDM / ASSESSMENT AND PLAN / ED COURSE  I reviewed the triage vital signs and the nursing notes.    Patient here with shortness of breath, cough and congestion.  Hypoxic with EMS.  Does not wear oxygen chronically.  The patient is on the cardiac monitor to evaluate for evidence of arrhythmia and/or significant heart rate changes.   DIFFERENTIAL DIAGNOSIS (includes but not limited to):   Pneumonia, viral URI, COPD, CHF, pneumothorax, PE   Patient's presentation is most consistent with acute presentation with potential threat to life or bodily function.   PLAN: Will  obtain CBC, BMP, troponin, D-dimer, COVID and flu swab, chest x-ray.   MEDICATIONS GIVEN IN ED: Medications - No data to display   ED COURSE: Patient's labs show no leukocytosis.  Reassuring electrolytes and LFTs.  Troponin negative.  Lactic normal.  Cath urine shows a large amount of blood and some pyuria but no bacteria.  Culture is pending.  D-dimer negative.  Chest x-ray reviewed and interpreted by myself and the radiologist and shows bronchitic changes but no infiltrate.  CT head shows sinusitis but no intracranial abnormality.  Patient is  positive for COVID-19.  Given altered mental status and intermittent hypoxia, will admit.   CONSULTS:  Consulted and discussed patient's case with hospitalist, Dr. Sidney Ace.  I have recommended admission and consulting physician agrees and will place admission orders.  Patient (and family if present) agree with this plan.   I reviewed all nursing notes, vitals, pertinent previous records.  All labs, EKGs, imaging ordered have been independently reviewed and interpreted by myself.    OUTSIDE RECORDS REVIEWED: Reviewed last PCP note on 03/14/2022.  Dr. Wynetta Emery prescribed prescription for Hycodan.       FINAL CLINICAL IMPRESSION(S) / ED DIAGNOSES   Final diagnoses:  Acute respiratory failure with hypoxia (HCC)  Altered mental status, unspecified altered mental status type  COVID-19     Rx / DC Orders   ED Discharge Orders     None        Note:  This document was prepared using Dragon voice recognition software and may include unintentional dictation errors.   Estoria Geary, Delice Bison, DO 03/17/22 0530

## 2022-03-17 NOTE — ED Notes (Signed)
Changed pts brief and provided peri care. Noticed pitting edema in BLE through lower abdomen.  MD notified.

## 2022-03-17 NOTE — ED Triage Notes (Signed)
Pt from home via [facility] with no c/o.  Pt sister called EMS " because Just didn't think she was doing good." Per EMS, pt having difficulty breathing.  Pt 84% on RA upon EMS arrival Pt does not wear home 02.  Pt placed on NRB at 15L with improvement of sat to 99%. EMS reports pt was initially refusing to be transported.

## 2022-03-18 DIAGNOSIS — J9601 Acute respiratory failure with hypoxia: Secondary | ICD-10-CM | POA: Diagnosis not present

## 2022-03-18 DIAGNOSIS — U071 COVID-19: Secondary | ICD-10-CM | POA: Diagnosis not present

## 2022-03-18 LAB — CBC WITH DIFFERENTIAL/PLATELET
Abs Immature Granulocytes: 0.02 10*3/uL (ref 0.00–0.07)
Basophils Absolute: 0 10*3/uL (ref 0.0–0.1)
Basophils Relative: 0 %
Eosinophils Absolute: 0 10*3/uL (ref 0.0–0.5)
Eosinophils Relative: 0 %
HCT: 36.8 % (ref 36.0–46.0)
Hemoglobin: 12.3 g/dL (ref 12.0–15.0)
Immature Granulocytes: 0 %
Lymphocytes Relative: 13 %
Lymphs Abs: 0.7 10*3/uL (ref 0.7–4.0)
MCH: 30.9 pg (ref 26.0–34.0)
MCHC: 33.4 g/dL (ref 30.0–36.0)
MCV: 92.5 fL (ref 80.0–100.0)
Monocytes Absolute: 0.2 10*3/uL (ref 0.1–1.0)
Monocytes Relative: 4 %
Neutro Abs: 4.6 10*3/uL (ref 1.7–7.7)
Neutrophils Relative %: 83 %
Platelets: 205 10*3/uL (ref 150–400)
RBC: 3.98 MIL/uL (ref 3.87–5.11)
RDW: 12.3 % (ref 11.5–15.5)
WBC: 5.5 10*3/uL (ref 4.0–10.5)
nRBC: 0 % (ref 0.0–0.2)

## 2022-03-18 LAB — COMPREHENSIVE METABOLIC PANEL
ALT: 21 U/L (ref 0–44)
AST: 22 U/L (ref 15–41)
Albumin: 3 g/dL — ABNORMAL LOW (ref 3.5–5.0)
Alkaline Phosphatase: 57 U/L (ref 38–126)
Anion gap: 7 (ref 5–15)
BUN: 14 mg/dL (ref 8–23)
CO2: 29 mmol/L (ref 22–32)
Calcium: 10.1 mg/dL (ref 8.9–10.3)
Chloride: 99 mmol/L (ref 98–111)
Creatinine, Ser: 0.61 mg/dL (ref 0.44–1.00)
GFR, Estimated: >60 mL/min (ref >60)
Glucose, Bld: 148 mg/dL — ABNORMAL HIGH (ref 70–99)
Potassium: 4 mmol/L (ref 3.5–5.1)
Sodium: 135 mmol/L (ref 135–145)
Total Bilirubin: 0.6 mg/dL (ref 0.3–1.2)
Total Protein: 6.5 g/dL (ref 6.5–8.1)

## 2022-03-18 LAB — URINE CULTURE: Culture: NO GROWTH

## 2022-03-18 MED ORDER — HALOPERIDOL LACTATE 5 MG/ML IJ SOLN
2.0000 mg | Freq: Once | INTRAMUSCULAR | Status: AC
  Administered 2022-03-18: 2 mg via INTRAVENOUS
  Filled 2022-03-18: qty 1

## 2022-03-18 NOTE — Progress Notes (Addendum)
PROGRESS NOTE    Virginia Clements   KMQ:[id number] DOB: 07/14/1929  DOA: 03/17/2022 Date of Service: 03/18/22 PCP: Baxter Hire, MD     Brief Narrative / Hospital Course:  Virginia Clements is a 88 y.o. female with medical history significant of wheelchair-bound, hypertension, depression, breast cancer (s/p of radiation therapy), who presents with cough and shortness breath x4 days, worsening. Associated w/ dysuria.  12/15: presented to ED. She was found to have oxygen desaturation to 84% on room air, which improved to 97% on 2 L oxygen.  At her normal baseline, patient is alert, oriented x 3 per her brother-in-law, brief confusion epidoe in ED appears to have resolved. Positive COVID PCR, D-dimer 0.45, troponin level 6 --> 6, BNP 84.2, lactic acid 0.6.  Urinalysis (hazy appearance, negative leukocyte, negative bacteria, WBC 11-20).  Temperature normal, blood pressure 139/61, heart rate 65, RR 17.  Chest x-ray showed bronchitis change.  CT of head is negative.  Patient is admitted to telemetry bed as inpatient w/ plan to continue Paxlovid, bronchodilators, O2, await UCx 12/16: remains on supplemental O2 and on IV rocephin pending urine culture   Consultants:  none  Procedures: none      ASSESSMENT & PLAN:   Principal Problem:   Acute hypoxemic respiratory failure due to COVID-19 [facility]) Active Problems:   Benign essential HTN   UTI (urinary tract infection)   Depression   Acute metabolic encephalopathy   Acute hypoxemic respiratory failure due to COVID-19 [facility]):  Patient has 2 L new oxygen requirement. telemetry bed as inpatient - can d/c tele today  continue Paxlovid check CRP Bronchodilators As needed Robitussin S/p 75 mg of Solu-Medrol, now on prednisone 50 mg daily   Benign essential HTN IV hydralazine as needed Metoprolol, amlodipine, enalapril   UTI (urinary tract infection) IV Rocephin Follow-up urine culture   Depression Continue home  medication   Acute metabolic encephalopathy: CT head negative.  This issue has resolved.  Likely due to UTI Frequent neurocheck      DVT prophylaxis: enoxaparin Pertinent IV fluids/nutrition: none Central lines / invasive devices: none  Code Status: DNR  Disposition: inpatient TOC needs: none at this time, may need HH/SNF - PT eval pending Barriers to discharge / significant pending items: O2 requirement d/t COVID, IV abx for UTI pending cx                Subjective:  Patient reports breathing feels a bit worse from yesterday Denies CP.  Pain controlled.  Denies new weakness.  Tolerating diet.  Reports no concerns w/ urination/defecation.   Family Communication: requested call to sister, Parke Simmers. Spoke w/ her by phone 03/18/22 12:34 PM all questions answered.     Objective Findings:  Vitals:   03/17/22 2014 03/17/22 2343 03/18/22 0625 03/18/22 0731  BP: 131/63 (!) 147/67 133/62 128/86  Pulse: 67 70 60 61  Resp: '18 20 18 16  '$ Temp: 98.9 F (37.2 C) 97.9 F (36.6 C) (!) 97.4 F (36.3 C) 98.7 F (37.1 C)  TempSrc:  Oral Oral   SpO2: 93% 92% 99% 93%  Weight:      Height:        Intake/Output Summary (Last 24 hours) at 03/18/2022 1234 Last data filed at 03/18/2022 1040 Gross per 24 hour  Intake 1100 ml  Output 1500 ml  Net -400 ml   Filed Weights   03/17/22 0244  Weight: 74.8 kg    Examination: Constitutional:  VS as above General Appearance: alert,  well-developed, well-nourished, NAD Respiratory: Normal respiratory effort No wheeze Scattered coarse breath sounds  Cardiovascular: S1/S2 normal RRR No murmur, no rub/gallop auscultated No lower extremity edema Gastrointestinal: No tenderness Musculoskeletal:  No clubbing/cyanosis of digits Symmetrical movement in all extremities Neurological: No cranial nerve deficit on limited exam Alert Psychiatric: Normal judgment/insight Normal mood and affect       Scheduled Medications:    amLODipine  10 mg Oral Daily   vitamin C  500 mg Oral Daily   busPIRone  10 mg Oral BID   enalapril  10 mg Oral Daily   enoxaparin (LOVENOX) injection  40 mg Subcutaneous Q24H   famotidine  20 mg Oral BID   ipratropium  2 puff Inhalation Q4H   methylPREDNISolone (SOLU-MEDROL) injection  1 mg/kg Intravenous Q12H   Followed by   Derrill Memo ON 03/20/2022] predniSONE  50 mg Oral Daily   metoprolol tartrate  25 mg Oral BID   nirmatrelvir/ritonavir EUA (renal dosing)  2 tablet Oral BID   timolol  1 drop Both Eyes QHS   zinc sulfate  220 mg Oral Daily    Continuous Infusions:  cefTRIAXone (ROCEPHIN)  IV 1 g (03/18/22 0929)    PRN Medications:  acetaminophen, albuterol, chlorpheniramine-HYDROcodone, guaiFENesin-dextromethorphan, hydrALAZINE, magnesium hydroxide, ondansetron **OR** ondansetron (ZOFRAN) IV, polyethylene glycol  Antimicrobials:  Anti-infectives (From admission, onward)    Start     Dose/Rate Route Frequency Ordered Stop   03/17/22 1000  nirmatrelvir/ritonavir EUA (PAXLOVID) 3 tablet  Status:  Discontinued        3 tablet Oral 2 times daily 03/17/22 0612 03/17/22 0617   03/17/22 1000  nirmatrelvir/ritonavir EUA (renal dosing) (PAXLOVID) 2 tablet        2 tablet Oral 2 times daily 03/17/22 0617 03/22/22 0959   03/17/22 0830  cefTRIAXone (ROCEPHIN) 1 g in sodium chloride 0.9 % 100 mL IVPB        1 g 200 mL/hr over 30 Minutes Intravenous Every 24 hours 03/17/22 0820             Data Reviewed: I have personally reviewed following labs and imaging studies  CBC: Recent Labs  Lab 03/17/22 0250 03/18/22 0336  WBC 8.0 5.5  NEUTROABS 5.0 4.6  HGB 11.7* 12.3  HCT 36.0 36.8  MCV 95.0 92.5  PLT 187 563   Basic Metabolic Panel: Recent Labs  Lab 03/17/22 0250 03/18/22 0336  NA 131* 135  K 4.7 4.0  CL 97* 99  CO2 28 29  GLUCOSE 104* 148*  BUN 16 14  CREATININE 0.75 0.61  CALCIUM 10.0 10.1   GFR: Estimated Creatinine Clearance: 47.3 mL/min (by C-G formula  based on SCr of 0.61 mg/dL). Liver Function Tests: Recent Labs  Lab 03/17/22 0250 03/18/22 0336  AST 17 22  ALT 18 21  ALKPHOS 59 57  BILITOT 0.6 0.6  PROT 6.1* 6.5  ALBUMIN 3.1* 3.0*   No results for input(s): "LIPASE", "AMYLASE" in the last 168 hours. No results for input(s): "AMMONIA" in the last 168 hours. Coagulation Profile: No results for input(s): "INR", "PROTIME" in the last 168 hours. Cardiac Enzymes: No results for input(s): "CKTOTAL", "CKMB", "CKMBINDEX", "TROPONINI" in the last 168 hours. BNP (last 3 results) No results for input(s): "PROBNP" in the last 8760 hours. HbA1C: No results for input(s): "HGBA1C" in the last 72 hours. CBG: No results for input(s): "GLUCAP" in the last 168 hours. Lipid Profile: No results for input(s): "CHOL", "HDL", "LDLCALC", "TRIG", "CHOLHDL", "LDLDIRECT" in the last 72 hours. Thyroid Function  Tests: No results for input(s): "TSH", "T4TOTAL", "FREET4", "T3FREE", "THYROIDAB" in the last 72 hours. Anemia Panel: Recent Labs    03/17/22 0618  FERRITIN 177   Most Recent Urinalysis On File:     Component Value Date/Time   COLORURINE YELLOW (A) 03/17/2022 0250   APPEARANCEUR HAZY (A) 03/17/2022 0250   APPEARANCEUR Clear 08/10/2011 0226   LABSPEC 1.013 03/17/2022 0250   LABSPEC 1.025 08/10/2011 0226   PHURINE 5.0 03/17/2022 0250   GLUCOSEU NEGATIVE 03/17/2022 0250   GLUCOSEU Negative 08/10/2011 0226   HGBUR MODERATE (A) 03/17/2022 0250   BILIRUBINUR NEGATIVE 03/17/2022 0250   BILIRUBINUR Negative 08/10/2011 0226   KETONESUR NEGATIVE 03/17/2022 0250   PROTEINUR NEGATIVE 03/17/2022 0250   NITRITE NEGATIVE 03/17/2022 0250   LEUKOCYTESUR NEGATIVE 03/17/2022 0250   LEUKOCYTESUR Negative 08/10/2011 0226   Sepsis Labs: '@LABRCNTIP'$ (procalcitonin:4,lacticidven:4)  Recent Results (from the past 240 hour(s))  Urine Culture     Status: None   Collection Time: 03/17/22  2:50 AM   Specimen: Urine, Catheterized  Result Value Ref Range  Status   Specimen Description   Final    URINE, CATHETERIZED Performed at [facility], [address] [place], [place] [address]    Special Requests   Final    NONE Performed at [facility], [address], [place], [place] [address]    Culture   Final    NO GROWTH Performed at [facility], [place] [address], [place], [place] [address]    Report Status 03/18/2022 FINAL  Final  Resp panel by RT-PCR (RSV, Flu A&B, Covid) Anterior Nasal Swab     Status: Abnormal   Collection Time: 03/17/22  2:51 AM   Specimen: Anterior Nasal Swab  Result Value Ref Range Status   SARS Coronavirus 2 by RT PCR POSITIVE (A) NEGATIVE Final   Influenza A by PCR NEGATIVE NEGATIVE Final   Influenza B by PCR NEGATIVE NEGATIVE Final   Resp Syncytial Virus by PCR NEGATIVE NEGATIVE Final    Comment: Performed at [facility], [address], [place], [place] [address]         Radiology Studies: CT HEAD WO CONTRAST (5MM)  Result Date: 03/17/2022 CLINICAL DATA:  Mental status change with unknown cause EXAM: CT HEAD WITHOUT CONTRAST TECHNIQUE: Contiguous axial images were obtained from the base of the skull through the vertex without intravenous contrast. RADIATION DOSE REDUCTION: This exam was performed according to the departmental dose-optimization program which includes automated exposure control, adjustment of the mA and/or kV according to patient size and/or use of iterative reconstruction technique. COMPARISON:  06/15/2020 FINDINGS: Brain: No evidence of acute infarction, hemorrhage, hydrocephalus, extra-axial collection or mass lesion/mass effect. Generalized atrophy with variable sulcal widening that is unchanged in pattern. Confluent chronic small vessel ischemia in the deep cerebral white matter. Vascular: No hyperdense vessel or unexpected calcification. Skull: Normal. Negative for fracture or focal lesion. Sinuses/Orbits: Patchy bilateral sinus opacification with left  maxillary, bilateral sphenoid, and frontal sinus fluid levels. IMPRESSION: 1. No acute intracranial finding. Brain atrophy and chronic small vessel ischemia. 2. Active bilateral sinusitis with fluid levels. Electronically Signed   By: Jorje Guild M.D.   On: 03/17/2022 04:39   DG Chest Portable 1 View  Result Date: 03/17/2022 CLINICAL DATA:  Dyspnea.  Difficulty breathing. EXAM: PORTABLE CHEST 1 VIEW COMPARISON:  06/15/2020 FINDINGS: Shallow inspiration. Cardiac enlargement. Peribronchial thickening with perihilar interstitial changes similar to prior study, likely representing chronic bronchitis and fibrosis. No developing consolidation or airspace  disease. No pleural effusions. No pneumothorax. Mediastinal contours appear intact. Calcification of the aorta. IMPRESSION: Peribronchial thickening with central perihilar interstitial changes similar to prior study, likely chronic bronchitic changes and chronic fibrosis. No developing consolidation. Electronically Signed   By: Lucienne Capers M.D.   On: 03/17/2022 03:23            LOS: 1 day      Emeterio Reeve, DO [facility] 03/18/2022, 12:34 PM    Dictation software may have been used to generate the above note. Typos may occur and escape review in typed/dictated notes. Please contact Dr Sheppard Coil directly for clarity if needed.  Staff may message me via secure chat in [place]  but this may not receive an immediate response,  please page me for urgent matters!  If 7PM-7AM, please contact night coverage [email or web address]

## 2022-03-18 NOTE — Hospital Course (Addendum)
Virginia Clements is a 88 y.o. female with medical history significant of wheelchair-bound, hypertension, depression, breast cancer (s/p of radiation therapy), who presents with cough and shortness breath x4 days, worsening. Associated w/ dysuria.  12/15: presented to ED. She was found to have oxygen desaturation to 84% on room air, which improved to 97% on 2 L oxygen.  At her normal baseline, patient is alert, oriented x 3 per her brother-in-law, brief confusion epidoe in ED appears to have resolved. Positive COVID PCR, D-dimer 0.45, troponin level 6 --> 6, BNP 84.2, lactic acid 0.6.  Urinalysis (hazy appearance, negative leukocyte, negative bacteria, WBC 11-20).  Temperature normal, blood pressure 139/61, heart rate 65, RR 17.  Chest x-ray showed bronchitis change.  CT of head is negative.  Patient is admitted to telemetry bed as inpatient w/ plan to continue Paxlovid, bronchodilators, O2, await UCx 12/16: remains on supplemental O2 and on IV rocephin pending urine culture  12/17: UCx no growth, d/c IV abx. Trial off [place] O2  but pt became tachycardic and tachypneic, restart telemetry and placed back on O2   Consultants:  none  Procedures: none      ASSESSMENT & PLAN:   Principal Problem:   Acute hypoxemic respiratory failure due to COVID-19 [facility]) Active Problems:   Benign essential HTN   UTI (urinary tract infection) RULED OUT   Depression   Acute metabolic encephalopathy RESOLVES   Acute hypoxemic respiratory failure due to COVID-19 [facility]):  Patient has 2 L new oxygen requirement. telemetry bed as inpatient - can d/c tele today  continue Paxlovid check CRP Bronchodilators As needed Robitussin S/p 75 mg of Solu-Medrol, now on prednisone 50 mg daily to complete course    Benign essential HTN Metoprolol, amlodipine, enalapril   UTI (urinary tract infection) RULED OUT IV Rocephin d/c, UCx negative no growth   Depression Continue home medication   Acute metabolic  encephalopathy: CT head negative.  This issue has resolved.  Likely due toacute illness  Frequent neurocheck      DVT prophylaxis: enoxaparin Pertinent IV fluids/nutrition: none Central lines / invasive devices: none  Code Status: DNR  Disposition: inpatient TOC needs: none at this time, may need HH/SNF - PT eval pending Barriers to discharge / significant pending items: O2 requirement d/t COVID, IV abx for UTI pending cx

## 2022-03-19 DIAGNOSIS — J9601 Acute respiratory failure with hypoxia: Secondary | ICD-10-CM | POA: Diagnosis not present

## 2022-03-19 DIAGNOSIS — U071 COVID-19: Secondary | ICD-10-CM | POA: Diagnosis not present

## 2022-03-19 MED ORDER — INFLUENZA VAC A&B SA ADJ QUAD 0.5 ML IM PRSY
0.5000 mL | PREFILLED_SYRINGE | INTRAMUSCULAR | Status: AC
  Administered 2022-03-20: 0.5 mL via INTRAMUSCULAR
  Filled 2022-03-19: qty 0.5

## 2022-03-19 MED ORDER — DILTIAZEM HCL 25 MG/5ML IV SOLN
10.0000 mg | Freq: Once | INTRAVENOUS | Status: AC
  Administered 2022-03-19: 10 mg via INTRAVENOUS
  Filled 2022-03-19: qty 5

## 2022-03-19 NOTE — Progress Notes (Incomplete)
       CROSS COVER NOTE  NAME: Virginia Clements MRN: [id number] DOB : 24-Jan-1930 ATTENDING PHYSICIAN: Emeterio Reeve, DO    Date of Service   03/19/2022   HPI/Events of Note   HR 133 BP 133/104  Interventions   Assessment/Plan:  Atrial Fibrillation with Rapid Ventricular Response 10 mg IV Cardizem CBC, BMP, Mg, TSH with AM labs       To reach the provider On-Call:   7AM- 7PM see care teams to locate the attending and reach out to them via [email or web address]. 7PM-7AM contact night-coverage If you still have difficulty reaching the appropriate provider, please page the [facility] (Director on Call) for [facility] on amion for assistance  This document was prepared using Set designer software and may include unintentional dictation errors.  Neomia Glass DNP, MBA, FNP-BC Nurse Practitioner [facility] Pager [phone]

## 2022-03-19 NOTE — TOC Progression Note (Addendum)
Transition of Care [facility]) - Progression Note    Patient Details  Name: Virginia Clements MRN: [id number] Date of Birth: Nov 06, 1929  [facility]) CM/SW Contact  [name], RN Phone Number: 03/19/2022, 3:38 PM  Clinical Narrative:  12/17: Patient pending possible discharge if transportation can be arranged pending bad weather and if oxygen can be weaned or set up prior to discharge. Contacted sister where patient lives and there will be someone at the home if EMS transports home. [name] RN CM   [name] (Sister patient lives with) at address below.  [place]  [name] [phone]       Expected Discharge Plan and Services                                                 Social Determinants of Health (SDOH) Interventions    Readmission Risk Interventions    06/16/2020    3:52 PM  Readmission Risk Prevention Plan  Post Dischage Appt Complete  Medication Screening Complete  Transportation Screening Complete

## 2022-03-19 NOTE — Progress Notes (Signed)
PROGRESS NOTE    Virginia Clements   JOI:[id number] DOB: 11-09-1929  DOA: 03/17/2022 Date of Service: 03/19/22 PCP: Baxter Hire, MD     Brief Narrative / Hospital Course:  Virginia Clements is a 88 y.o. female with medical history significant of wheelchair-bound, hypertension, depression, breast cancer (s/p of radiation therapy), who presents with cough and shortness breath x4 days, worsening. Associated w/ dysuria.  12/15: presented to ED. She was found to have oxygen desaturation to 84% on room air, which improved to 97% on 2 L oxygen.  At her normal baseline, patient is alert, oriented x 3 per her brother-in-law, brief confusion epidoe in ED appears to have resolved. Positive COVID PCR, D-dimer 0.45, troponin level 6 --> 6, BNP 84.2, lactic acid 0.6.  Urinalysis (hazy appearance, negative leukocyte, negative bacteria, WBC 11-20).  Temperature normal, blood pressure 139/61, heart rate 65, RR 17.  Chest x-ray showed bronchitis change.  CT of head is negative.  Patient is admitted to telemetry bed as inpatient w/ plan to continue Paxlovid, bronchodilators, O2, await UCx 12/16: remains on supplemental O2 and on IV rocephin pending urine culture  12/17: UCx no growth, d/c IV abx. Trial off [place] O2  but pt became tachycardic and tachypneic, restart telemetry and placed back on O2   Consultants:  none  Procedures: none      ASSESSMENT & PLAN:   Principal Problem:   Acute hypoxemic respiratory failure due to COVID-19 [facility]) Active Problems:   Benign essential HTN   UTI (urinary tract infection) RULED OUT   Depression   Acute metabolic encephalopathy RESOLVES   Acute hypoxemic respiratory failure due to COVID-19 [facility]):  Patient has 2 L new oxygen requirement. telemetry bed as inpatient - can d/c tele today  continue Paxlovid check CRP Bronchodilators As needed Robitussin S/p 75 mg of Solu-Medrol, now on prednisone 50 mg daily to complete course    Benign  essential HTN Metoprolol, amlodipine, enalapril   UTI (urinary tract infection) RULED OUT IV Rocephin d/c, UCx negative no growth   Depression Continue home medication   Acute metabolic encephalopathy: CT head negative.  This issue has resolved.  Likely due toacute illness  Frequent neurocheck      DVT prophylaxis: enoxaparin Pertinent IV fluids/nutrition: none Central lines / invasive devices: none  Code Status: DNR  Disposition: inpatient TOC needs: none at this time, may need HH/SNF - PT eval pending Barriers to discharge / significant pending items: O2 requirement d/t COVID, IV abx for UTI pending cx                Subjective:  Patient reports breathing okay but then feeling worse after O2 is off, SpO2 borderline 90-94% RA at rest  Denies CP.  Pain controlled.  Denies new weakness.  Tolerating diet.  Reports no concerns w/ urination/defecation.   Family Communication: Spoke w/ her sister Parke Simmers by phone 03/19/22 4:11 PM all questions answered. TOC in touch w/ [name] today     Objective Findings:  Vitals:   03/18/22 2147 03/19/22 0448 03/19/22 0841 03/19/22 1550  BP: (!) 158/80 (!) 154/72 (!) 157/78 118/79  Pulse: 76 68 82 98  Resp: '20 18 17 '$ (!) 24  Temp: 97.8 F (36.6 C) 98 F (36.7 C) 98.4 F (36.9 C) 97.9 F (36.6 C)  TempSrc: Oral     SpO2: 100% 100% 100% 95%  Weight:      Height:        Intake/Output Summary (Last 24 hours)  at 03/19/2022 1611 Last data filed at 03/19/2022 7846 Gross per 24 hour  Intake 120 ml  Output 625 ml  Net -505 ml   Filed Weights   03/17/22 0244  Weight: 74.8 kg    Examination: Constitutional:  VS as above General Appearance: alert, well-developed, well-nourished, NAD Respiratory: Normal respiratory effort No wheeze Scattered coarse breath sounds  Cardiovascular: S1/S2 normal RRR in AM, tachycardic in PM into 110s No murmur, no rub/gallop auscultated No lower extremity edema Gastrointestinal: No  tenderness Musculoskeletal:  No clubbing/cyanosis of digits Symmetrical movement in all extremities Neurological: No cranial nerve deficit on limited exam Alert Psychiatric: Normal judgment/insight Normal mood and affect       Scheduled Medications:   amLODipine  10 mg Oral Daily   vitamin C  500 mg Oral Daily   busPIRone  10 mg Oral BID   enalapril  10 mg Oral Daily   enoxaparin (LOVENOX) injection  40 mg Subcutaneous Q24H   famotidine  20 mg Oral BID   [START ON 03/20/2022] influenza vaccine adjuvanted  0.5 mL Intramuscular Tomorrow-1000   ipratropium  2 puff Inhalation Q4H   methylPREDNISolone (SOLU-MEDROL) injection  1 mg/kg Intravenous Q12H   Followed by   Derrill Memo ON 03/20/2022] predniSONE  50 mg Oral Daily   metoprolol tartrate  25 mg Oral BID   nirmatrelvir/ritonavir EUA (renal dosing)  2 tablet Oral BID   timolol  1 drop Both Eyes QHS   zinc sulfate  220 mg Oral Daily    Continuous Infusions:  cefTRIAXone (ROCEPHIN)  IV 1 g (03/19/22 0853)    PRN Medications:  acetaminophen, albuterol, chlorpheniramine-HYDROcodone, guaiFENesin-dextromethorphan, hydrALAZINE, magnesium hydroxide, ondansetron **OR** ondansetron (ZOFRAN) IV, polyethylene glycol  Antimicrobials:  Anti-infectives (From admission, onward)    Start     Dose/Rate Route Frequency Ordered Stop   03/17/22 1000  nirmatrelvir/ritonavir EUA (PAXLOVID) 3 tablet  Status:  Discontinued        3 tablet Oral 2 times daily 03/17/22 0612 03/17/22 0617   03/17/22 1000  nirmatrelvir/ritonavir EUA (renal dosing) (PAXLOVID) 2 tablet        2 tablet Oral 2 times daily 03/17/22 0617 03/22/22 0959   03/17/22 0830  cefTRIAXone (ROCEPHIN) 1 g in sodium chloride 0.9 % 100 mL IVPB        1 g 200 mL/hr over 30 Minutes Intravenous Every 24 hours 03/17/22 0820             Data Reviewed: I have personally reviewed following labs and imaging studies  CBC: Recent Labs  Lab 03/17/22 0250 03/18/22 0336  WBC 8.0 5.5   NEUTROABS 5.0 4.6  HGB 11.7* 12.3  HCT 36.0 36.8  MCV 95.0 92.5  PLT 187 962   Basic Metabolic Panel: Recent Labs  Lab 03/17/22 0250 03/18/22 0336  NA 131* 135  K 4.7 4.0  CL 97* 99  CO2 28 29  GLUCOSE 104* 148*  BUN 16 14  CREATININE 0.75 0.61  CALCIUM 10.0 10.1   GFR: Estimated Creatinine Clearance: 47.3 mL/min (by C-G formula based on SCr of 0.61 mg/dL). Liver Function Tests: Recent Labs  Lab 03/17/22 0250 03/18/22 0336  AST 17 22  ALT 18 21  ALKPHOS 59 57  BILITOT 0.6 0.6  PROT 6.1* 6.5  ALBUMIN 3.1* 3.0*   No results for input(s): "LIPASE", "AMYLASE" in the last 168 hours. No results for input(s): "AMMONIA" in the last 168 hours. Coagulation Profile: No results for input(s): "INR", "PROTIME" in the last 168 hours. Cardiac  Enzymes: No results for input(s): "CKTOTAL", "CKMB", "CKMBINDEX", "TROPONINI" in the last 168 hours. BNP (last 3 results) No results for input(s): "PROBNP" in the last 8760 hours. HbA1C: No results for input(s): "HGBA1C" in the last 72 hours. CBG: No results for input(s): "GLUCAP" in the last 168 hours. Lipid Profile: No results for input(s): "CHOL", "HDL", "LDLCALC", "TRIG", "CHOLHDL", "LDLDIRECT" in the last 72 hours. Thyroid Function Tests: No results for input(s): "TSH", "T4TOTAL", "FREET4", "T3FREE", "THYROIDAB" in the last 72 hours. Anemia Panel: Recent Labs    03/17/22 0618  FERRITIN 177   Most Recent Urinalysis On File:     Component Value Date/Time   COLORURINE YELLOW (A) 03/17/2022 0250   APPEARANCEUR HAZY (A) 03/17/2022 0250   APPEARANCEUR Clear 08/10/2011 0226   LABSPEC 1.013 03/17/2022 0250   LABSPEC 1.025 08/10/2011 0226   PHURINE 5.0 03/17/2022 0250   GLUCOSEU NEGATIVE 03/17/2022 0250   GLUCOSEU Negative 08/10/2011 0226   HGBUR MODERATE (A) 03/17/2022 0250   BILIRUBINUR NEGATIVE 03/17/2022 0250   BILIRUBINUR Negative 08/10/2011 0226   KETONESUR NEGATIVE 03/17/2022 0250   PROTEINUR NEGATIVE 03/17/2022 0250    NITRITE NEGATIVE 03/17/2022 0250   LEUKOCYTESUR NEGATIVE 03/17/2022 0250   LEUKOCYTESUR Negative 08/10/2011 0226   Sepsis Labs: '@LABRCNTIP'$ (procalcitonin:4,lacticidven:4)  Recent Results (from the past 240 hour(s))  Urine Culture     Status: None   Collection Time: 03/17/22  2:50 AM   Specimen: Urine, Catheterized  Result Value Ref Range Status   Specimen Description   Final    URINE, CATHETERIZED Performed at [facility], [address], [place], [place] [address]    Special Requests   Final    NONE Performed at [facility], [address], [place], [place] [address]    Culture   Final    NO GROWTH Performed at [facility], [place] [address] [place], [place] [address]    Report Status 03/18/2022 FINAL  Final  Resp panel by RT-PCR (RSV, Flu A&B, Covid) Anterior Nasal Swab     Status: Abnormal   Collection Time: 03/17/22  2:51 AM   Specimen: Anterior Nasal Swab  Result Value Ref Range Status   SARS Coronavirus 2 by RT PCR POSITIVE (A) NEGATIVE Final   Influenza A by PCR NEGATIVE NEGATIVE Final   Influenza B by PCR NEGATIVE NEGATIVE Final   Resp Syncytial Virus by PCR NEGATIVE NEGATIVE Final    Comment: Performed at [facility], [address], [place], [place] [address]         Radiology Studies: CT HEAD WO CONTRAST (5MM)  Result Date: 03/17/2022 CLINICAL DATA:  Mental status change with unknown cause EXAM: CT HEAD WITHOUT CONTRAST TECHNIQUE: Contiguous axial images were obtained from the base of the skull through the vertex without intravenous contrast. RADIATION DOSE REDUCTION: This exam was performed according to the departmental dose-optimization program which includes automated exposure control, adjustment of the mA and/or kV according to patient size and/or use of iterative reconstruction technique. COMPARISON:  06/15/2020 FINDINGS: Brain: No evidence of acute infarction, hemorrhage, hydrocephalus, extra-axial collection or mass  lesion/mass effect. Generalized atrophy with variable sulcal widening that is unchanged in pattern. Confluent chronic small vessel ischemia in the deep cerebral white matter. Vascular: No hyperdense vessel or unexpected calcification. Skull: Normal. Negative for fracture or focal lesion. Sinuses/Orbits: Patchy bilateral sinus opacification with left maxillary, bilateral sphenoid, and frontal sinus fluid levels. IMPRESSION: 1. No acute intracranial finding. Brain atrophy and chronic small vessel ischemia. 2. Active bilateral sinusitis  with fluid levels. Electronically Signed   By: Jorje Guild M.D.   On: 03/17/2022 04:39   DG Chest Portable 1 View  Result Date: 03/17/2022 CLINICAL DATA:  Dyspnea.  Difficulty breathing. EXAM: PORTABLE CHEST 1 VIEW COMPARISON:  06/15/2020 FINDINGS: Shallow inspiration. Cardiac enlargement. Peribronchial thickening with perihilar interstitial changes similar to prior study, likely representing chronic bronchitis and fibrosis. No developing consolidation or airspace disease. No pleural effusions. No pneumothorax. Mediastinal contours appear intact. Calcification of the aorta. IMPRESSION: Peribronchial thickening with central perihilar interstitial changes similar to prior study, likely chronic bronchitic changes and chronic fibrosis. No developing consolidation. Electronically Signed   By: Lucienne Capers M.D.   On: 03/17/2022 03:23            LOS: 2 days      Emeterio Reeve, DO [facility] 03/19/2022, 4:11 PM    Dictation software may have been used to generate the above note. Typos may occur and escape review in typed/dictated notes. Please contact Dr Sheppard Coil directly for clarity if needed.  Staff may message me via secure chat in [place]  but this may not receive an immediate response,  please page me for urgent matters!  If 7PM-7AM, please contact night coverage [email or web address]

## 2022-03-19 NOTE — Evaluation (Signed)
Physical Therapy Evaluation Patient Details Name: Virginia Clements MRN: [id number] DOB: 1929/05/03 Today's Date: 03/19/2022  History of Present Illness  Virginia Clements is a 88 y.o. female with medical history significant of wheelchair-bound, hypertension, depression, breast cancer (s/p of radiation therapy), who presents with cough and shortness breath. Patient is COVID+  Clinical Impression  Patient received in bed. She is agreeable to PT assessment. Patient reports she is bedbound at home and lives with sister and brother in [place]. Sister in law called and she is in agreement to bring patient back home at discharge. They have no equipment needs at this time. Patient is at baseline level of mobility and does not need further PT needs at this time. Signing off.         Recommendations for follow up therapy are one component of a multi-disciplinary discharge planning process, led by the attending physician.  Recommendations may be updated based on patient status, additional functional criteria and insurance authorization.  Follow Up Recommendations No PT follow up      Assistance Recommended at Discharge None  Patient can return home with the following  A lot of help with bathing/dressing/bathroom    Equipment Recommendations None recommended by PT  Recommendations for Other Services       Functional Status Assessment Patient has not had a recent decline in their functional status     Precautions / Restrictions Precautions Precautions: Fall Restrictions Weight Bearing Restrictions: No      Mobility  Bed Mobility Overal bed mobility: Needs Assistance Bed Mobility: Rolling Rolling: +2 for physical assistance, Max assist, Total assist         General bed mobility comments: patient required total assist for rolling in bed. Cues needed to reach for bed rails to assist as able    Transfers                   General transfer comment: unable     Ambulation/Gait               General Gait Details: unable  Stairs            Wheelchair Mobility    Modified Rankin (Stroke Patients Only)       Balance Overall balance assessment: History of Falls                                           Pertinent Vitals/Pain Pain Assessment Pain Assessment: Faces Faces Pain Scale: Hurts a little bit Breathing: normal Negative Vocalization: occasional moan/groan, low speech, negative/disapproving quality Facial Expression: smiling or inexpressive Body Language: relaxed Consolability: no need to console PAINAD Score: 1 Pain Location: Right knee Pain Descriptors / Indicators: Sore, Discomfort Pain Intervention(s): Monitored during session, Repositioned    Home Living Family/patient expects to be discharged to:: Private residence Living Arrangements: Other relatives Available Help at Discharge: Family;Available 24 hours/day Type of Home: House           Home Equipment: Hospital bed Additional Comments: patient and sister report she is mainly bed bound. Sister plans to continue caring for her.    Prior Function Prior Level of Function : Needs assist             Mobility Comments: patient is bedbound ADLs Comments: requires assistance     Hand Dominance        Extremity/Trunk Assessment  Upper Extremity Assessment Upper Extremity Assessment: Defer to OT evaluation    Lower Extremity Assessment Lower Extremity Assessment: Generalized weakness;RLE deficits/detail RLE Deficits / Details: pain behind right knee RLE Coordination: decreased gross motor       Communication   Communication: No difficulties  Cognition Arousal/Alertness: Awake/alert Behavior During Therapy: WFL for tasks assessed/performed Overall Cognitive Status: Within Functional Limits for tasks assessed                                          General Comments      Exercises      Assessment/Plan    PT Assessment Patient does not need any further PT services  PT Problem List         PT Treatment Interventions      PT Goals (Current goals can be found in the Care Plan section)  Acute Rehab PT Goals Patient Stated Goal: to return home with sister and brother in law PT Goal Formulation: With patient/family Time For Goal Achievement: 03/26/22 Potential to Achieve Goals: Good    Frequency       Co-evaluation               AM-PAC PT "6 Clicks" Mobility  Outcome Measure Help needed turning from your back to your side while in a flat bed without using bedrails?: Total Help needed moving from lying on your back to sitting on the side of a flat bed without using bedrails?: Total Help needed moving to and from a bed to a chair (including a wheelchair)?: Total Help needed standing up from a chair using your arms (e.g., wheelchair or bedside chair)?: Total Help needed to walk in hospital room?: Total Help needed climbing 3-5 steps with a railing? : Total 6 Click Score: 6    End of Session   Activity Tolerance: Patient limited by pain;Patient limited by fatigue Patient left: in bed;with call bell/phone within reach;with bed alarm set Nurse Communication: Mobility status PT Visit Diagnosis: Other abnormalities of gait and mobility (R26.89);Muscle weakness (generalized) (M62.81)    Time: 4715-9539 PT Time Calculation (min) (ACUTE ONLY): 18 min   Charges:   PT Evaluation $PT Eval Moderate Complexity: 1 Mod          Jaxiel Kines, PT, GCS 03/19/22,10:04 AM

## 2022-03-19 NOTE — TOC Transition Note (Addendum)
Transition of Care [facility]) - CM/SW Discharge Note   Patient Details  Name: Virginia Clements MRN: [id number] Date of Birth: [birth year]  [facility] CM/SW Contact:  [name], RN Phone Number: 03/19/2022, 3:50 PM   Clinical Narrative: 12/17: EMS forms, excluding DNR form, printed to unit. Confirmed with sister that someone at the destination home. Pending oxygen weaning progress, and EMS transport availability.     Non-emergency [facility] [phone] if after 6 pm when TOC unavailable. Barbie Bell RN CM   420 pm DC canceled due to irregular and increased HR and RR off oxygen per provider/Unit RN chat and notes. [name] RN CM   Final next level of care: [place]     Patient Goals and CMS Choice        Discharge Placement                       Discharge Plan and Services                          HH Arranged: NA HH Agency: NA        Social Determinants of Health (SDOH) Interventions     Readmission Risk Interventions    06/16/2020    3:52 PM  Readmission Risk Prevention Plan  Post Dischage Appt Complete  Medication Screening Complete  Transportation Screening Complete

## 2022-03-20 ENCOUNTER — Other Ambulatory Visit (HOSPITAL_BASED_OUTPATIENT_CLINIC_OR_DEPARTMENT_OTHER): Payer: Self-pay | Admitting: Osteopathic Medicine

## 2022-03-20 DIAGNOSIS — I480 Paroxysmal atrial fibrillation: Secondary | ICD-10-CM

## 2022-03-20 DIAGNOSIS — J9601 Acute respiratory failure with hypoxia: Secondary | ICD-10-CM | POA: Diagnosis not present

## 2022-03-20 DIAGNOSIS — U071 COVID-19: Secondary | ICD-10-CM | POA: Diagnosis not present

## 2022-03-20 LAB — BASIC METABOLIC PANEL
Anion gap: 10 (ref 5–15)
BUN: 18 mg/dL (ref 8–23)
CO2: 30 mmol/L (ref 22–32)
Calcium: 10.6 mg/dL — ABNORMAL HIGH (ref 8.9–10.3)
Chloride: 100 mmol/L (ref 98–111)
Creatinine, Ser: 0.59 mg/dL (ref 0.44–1.00)
GFR, Estimated: >60 mL/min (ref >60)
Glucose, Bld: 164 mg/dL — ABNORMAL HIGH (ref 70–99)
Potassium: 3.5 mmol/L (ref 3.5–5.1)
Sodium: 140 mmol/L (ref 135–145)

## 2022-03-20 LAB — CBC
HCT: 40.3 % (ref 36.0–46.0)
Hemoglobin: 13.6 g/dL (ref 12.0–15.0)
MCH: 30.7 pg (ref 26.0–34.0)
MCHC: 33.7 g/dL (ref 30.0–36.0)
MCV: 91 fL (ref 80.0–100.0)
Platelets: 339 10*3/uL (ref 150–400)
RBC: 4.43 MIL/uL (ref 3.87–5.11)
RDW: 12.7 % (ref 11.5–15.5)
WBC: 14.7 10*3/uL — ABNORMAL HIGH (ref 4.0–10.5)
nRBC: 0 % (ref 0.0–0.2)

## 2022-03-20 LAB — MAGNESIUM: Magnesium: 2.2 mg/dL (ref 1.7–2.4)

## 2022-03-20 LAB — TSH: TSH: 0.102 u[IU]/mL — ABNORMAL LOW (ref 0.350–4.500)

## 2022-03-20 MED ORDER — IPRATROPIUM BROMIDE HFA 17 MCG/ACT IN AERS: 2.0000 | INHALATION_SPRAY | RESPIRATORY_TRACT | 12 refills | Status: DC

## 2022-03-20 MED ORDER — METOPROLOL TARTRATE 25 MG PO TABS
12.5000 mg | ORAL_TABLET | Freq: Two times a day (BID) | ORAL | Status: DC
  Administered 2022-03-20 – 2022-03-21 (×2): 12.5 mg via ORAL
  Filled 2022-03-20 (×2): qty 1

## 2022-03-20 MED ORDER — PREDNISONE 10 MG PO TABS: ORAL_TABLET | ORAL | 0 refills | Status: AC

## 2022-03-20 MED ORDER — FAMOTIDINE 20 MG PO TABS: 20.0000 mg | ORAL_TABLET | Freq: Two times a day (BID) | ORAL | 0 refills | Status: DC

## 2022-03-20 MED ORDER — NIRMATRELVIR/RITONAVIR (PAXLOVID) TABLET (RENAL DOSING): 2.0000 | ORAL_TABLET | Freq: Two times a day (BID) | ORAL | 0 refills | Status: DC

## 2022-03-20 MED ORDER — ALBUTEROL SULFATE HFA 108 (90 BASE) MCG/ACT IN AERS: 2.0000 | INHALATION_SPRAY | RESPIRATORY_TRACT | 0 refills | Status: AC | PRN

## 2022-03-20 MED ORDER — ZINC SULFATE 220 (50 ZN) MG PO CAPS: 220.0000 mg | ORAL_CAPSULE | Freq: Every day | ORAL | 0 refills | Status: DC

## 2022-03-20 MED ORDER — METOPROLOL TARTRATE 5 MG/5ML IV SOLN: 5.0000 mg | Freq: Four times a day (QID) | INTRAVENOUS | Status: DC | PRN

## 2022-03-20 MED ORDER — METOPROLOL TARTRATE 25 MG PO TABS: 12.5000 mg | ORAL_TABLET | Freq: Two times a day (BID) | ORAL | 0 refills | Status: DC

## 2022-03-20 MED ORDER — GUAIFENESIN-DM 100-10 MG/5ML PO SYRP: 10.0000 mL | ORAL_SOLUTION | ORAL | 0 refills | Status: DC | PRN

## 2022-03-20 NOTE — Progress Notes (Signed)
Attempted to call sister, Parke Simmers.  No answer - Case Manager aware.

## 2022-03-20 NOTE — Discharge Summary (Addendum)
Physician Discharge Summary   Patient: Virginia Clements MRN: [id number]  DOB: 08-Nov-1929   Admit:     Date of Admission: 03/17/2022 Admitted from: home   Discharge: Date of discharge: 03/20/22 Disposition: Home Condition at discharge: fair  CODE STATUS: DNR     Discharge Physician: Emeterio Reeve, DO [facility]     PCP: Baxter Hire, MD  Recommendations for Outpatient Follow-up:  Follow up with PCP Baxter Hire, MD in 1-2 weeks Please obtain labs/tests: CBC, BMP in 1-2 weeks Please arrange echocardiogram    Discharge Instructions     Diet - low sodium heart healthy   Complete by: As directed    Diet - low sodium heart healthy   Complete by: As directed    Increase activity slowly   Complete by: As directed    Increase activity slowly   Complete by: As directed          Discharge Diagnoses: Principal Problem:   Acute hypoxemic respiratory failure due to COVID-19 [facility]) Active Problems:   Benign essential HTN   UTI (urinary tract infection)   Depression   Acute metabolic encephalopathy       Hospital Course: Virginia Clements is a 87 y.o. female with medical history significant of wheelchair-bound, hypertension, depression, breast cancer (s/p of radiation therapy), who presents with cough and shortness breath x4 days, worsening. Associated w/ dysuria.  12/15: presented to ED. She was found to have oxygen desaturation to 84% on room air, which improved to 97% on 2 L oxygen.  At her normal baseline, patient is alert, oriented x 3 per her brother-in-law, brief confusion epidoe in ED appears to have resolved. Positive COVID PCR, D-dimer 0.45, troponin level 6 --> 6, BNP 84.2, lactic acid 0.6.  Urinalysis (hazy appearance, negative leukocyte, negative bacteria, WBC 11-20).  Temperature normal, blood pressure 139/61, heart rate 65, RR 17.  Chest x-ray showed bronchitis change.  CT of head is negative.  Patient is admitted to  telemetry bed as inpatient w/ plan to continue Paxlovid, bronchodilators, O2, await UCx 12/16: remains on supplemental O2 and on IV rocephin pending urine culture  12/17: UCx no growth, d/c IV abx. Trial off [place] O2  but pt became tachycardic and tachypneic, restart telemetry and placed back on O2. Afib on EKG converted to NSR w/ IV metoprolol x1 12/18: attempted to get echo but was not completed, pt ok on RA and wants to go home, HR brady this morning, metoprolol dose reduced, pt d/c to follow outpatient   Consultants:  none  Procedures: none      ASSESSMENT & PLAN:   Principal Problem:   Acute hypoxemic respiratory failure due to COVID-19 [facility]) Active Problems:   Benign essential HTN   UTI (urinary tract infection) RULED OUT   Depression   Acute metabolic encephalopathy RESOLVES   Acute hypoxemic respiratory failure due to COVID-19 [facility]):  Patient has 2 L new oxygen requirement.  finish Paxlovid As needed Robitussin Finishe steroids   Paroxysmal atrial fibrillation Converted to sinus with metoprolol x1 IV Subsequent bradycardia, reduced home dose metoprolol  Echo outpatient  Risk felt to be greater than benefit for anticoagulation  Follow outpatient, consider cardiac monitoring but no further events on telemetry after convserion to NSR other than sinus brady   Benign essential HTN Metoprolol, amlodipine, enalapril   UTI (urinary tract infection) RULED OUT IV Rocephin d/c, UCx negative no growth   Depression Continue home medication   Acute metabolic  encephalopathy: CT head negative.  This issue has resolved.  Likely due toacute illness  Frequent neurocheck              Discharge Instructions  Allergies as of 03/20/2022       Reactions   Alendronate Other (See Comments)   Nervousness and shakiness per pt   Codeine    dizziness        Medication List     TAKE these medications    acetaminophen 325 MG tablet Commonly known as:  TYLENOL Take 2 tablets (650 mg total) by mouth every 6 (six) hours as needed for mild pain (or Fever >/= 101).   albuterol 108 (90 Base) MCG/ACT inhaler Commonly known as: VENTOLIN HFA Inhale 2 puffs into the lungs every 4 (four) hours as needed for wheezing or shortness of breath.   amLODipine 10 MG tablet Commonly known as: NORVASC Take 1 tablet (10 mg total) by mouth daily.   busPIRone 10 MG tablet Commonly known as: BUSPAR Take 10 mg by mouth 2 (two) times daily.   enalapril 10 MG tablet Commonly known as: VASOTEC Take 10 mg by mouth daily.   famotidine 20 MG tablet Commonly known as: PEPCID Take 1 tablet (20 mg total) by mouth 2 (two) times daily.   guaiFENesin-dextromethorphan 100-10 MG/5ML syrup Commonly known as: ROBITUSSIN DM Take 10 mLs by mouth every 4 (four) hours as needed for cough.   HYDROcodone bit-homatropine 5-1.5 MG/5ML syrup Commonly known as: HYCODAN Take 5 mLs by mouth every 6 (six) hours as needed.   ipratropium 17 MCG/ACT inhaler Commonly known as: ATROVENT HFA Inhale 2 puffs into the lungs every 4 (four) hours.   metoprolol tartrate 25 MG tablet Commonly known as: LOPRESSOR Take 0.5 tablets (12.5 mg total) by mouth 2 (two) times daily. What changed: how much to take   naproxen 375 MG tablet Commonly known as: NAPROSYN Take 1 tablet (375 mg total) by mouth 2 (two) times daily as needed for mild pain or moderate pain.   nirmatrelvir/ritonavir EUA (renal dosing) 10 x 150 MG & 10 x '100MG'$  Tabs Commonly known as: PAXLOVID Take 2 tablets by mouth 2 (two) times daily for 3 days. GFR >60. Take nirmatrelvir (150 mg) one tablet twice daily for 3 days and ritonavir (100 mg) one tablet twice daily for 3 days.   polyethylene glycol 17 g packet Commonly known as: MIRALAX / GLYCOLAX Take 17 g by mouth daily as needed for mild constipation or moderate constipation.   predniSONE 10 MG tablet Commonly known as: DELTASONE Take 5 tablets (50 mg total) by  mouth daily for 1 day, THEN 4 tablets (40 mg total) daily for 1 day, THEN 3 tablets (30 mg total) daily for 1 day, THEN 2 tablets (20 mg total) daily for 1 day, THEN 1 tablet (10 mg total) daily for 1 day. Start taking on: March 20, 2022   timolol 0.5 % ophthalmic gel-forming Commonly known as: TIMOPTIC-XR Place 1 drop into both eyes at bedtime.   zinc sulfate 220 (50 Zn) MG capsule Take 1 capsule (220 mg total) by mouth daily.          Allergies  Allergen Reactions   Alendronate Other (See Comments)    Nervousness and shakiness per pt   Codeine     dizziness     Subjective: pt feeling well off O2, would like to go home to have dinner.    Discharge Exam: BP 132/64 (BP Location: Left Arm)  Pulse (!) 58   Temp (!) 97.5 F (36.4 C)   Resp 20   Ht '5\' 5"'$  (1.651 m)   Wt 74.8 kg   SpO2 96%   BMI 27.46 kg/m  General: Pt is alert, awake, not in acute distress Cardiovascular: RRR, S1/S2 +, no rubs, no gallops Respiratory: CTA bilaterally, no wheezing, no rhonchi Abdominal: Soft, NT, ND, bowel sounds + Extremities: no edema, no cyanosis     The results of significant diagnostics from this hospitalization (including imaging, microbiology, ancillary and laboratory) are listed below for reference.     Microbiology: Recent Results (from the past 240 hour(s))  Urine Culture     Status: None   Collection Time: 03/17/22  2:50 AM   Specimen: Urine, Catheterized  Result Value Ref Range Status   Specimen Description   Final    URINE, CATHETERIZED Performed at [facility], [address], [place], [place] [address]    Special Requests   Final    NONE Performed at [facility], [address], [place], [place] [address]    Culture   Final    NO GROWTH Performed at [facility], [place] [address], [place], [place] [address]    Report Status 03/18/2022 FINAL  Final  Resp panel by RT-PCR (RSV, Flu A&B, Covid) Anterior Nasal Swab     Status:  Abnormal   Collection Time: 03/17/22  2:51 AM   Specimen: Anterior Nasal Swab  Result Value Ref Range Status   SARS Coronavirus 2 by RT PCR POSITIVE (A) NEGATIVE Final   Influenza A by PCR NEGATIVE NEGATIVE Final   Influenza B by PCR NEGATIVE NEGATIVE Final   Resp Syncytial Virus by PCR NEGATIVE NEGATIVE Final    Comment: Performed at [facility], [address] [place], [place] [address]     Labs: BNP (last 3 results) Recent Labs    03/17/22 0618  BNP 28.3   Basic Metabolic Panel: Recent Labs  Lab 03/17/22 0250 03/18/22 0336 03/20/22 0418  NA 131* 135 140  K 4.7 4.0 3.5  CL 97* 99 100  CO2 '28 29 30  '$ GLUCOSE 104* 148* 164*  BUN '16 14 18  '$ CREATININE 0.75 0.61 0.59  CALCIUM 10.0 10.1 10.6*  MG  --   --  2.2   Liver Function Tests: Recent Labs  Lab 03/17/22 0250 03/18/22 0336  AST 17 22  ALT 18 21  ALKPHOS 59 57  BILITOT 0.6 0.6  PROT 6.1* 6.5  ALBUMIN 3.1* 3.0*   No results for input(s): "LIPASE", "AMYLASE" in the last 168 hours. No results for input(s): "AMMONIA" in the last 168 hours. CBC: Recent Labs  Lab 03/17/22 0250 03/18/22 0336 03/20/22 0418  WBC 8.0 5.5 14.7*  NEUTROABS 5.0 4.6  --   HGB 11.7* 12.3 13.6  HCT 36.0 36.8 40.3  MCV 95.0 92.5 91.0  PLT 187 205 339   Cardiac Enzymes: No results for input(s): "CKTOTAL", "CKMB", "CKMBINDEX", "TROPONINI" in the last 168 hours. BNP: Invalid input(s): "POCBNP" CBG: No results for input(s): "GLUCAP" in the last 168 hours. D-Dimer No results for input(s): "DDIMER" in the last 72 hours. Hgb A1c No results for input(s): "HGBA1C" in the last 72 hours. Lipid Profile No results for input(s): "CHOL", "HDL", "LDLCALC", "TRIG", "CHOLHDL", "LDLDIRECT" in the last 72 hours. Thyroid function studies Recent Labs    03/20/22 0418  TSH 0.102*   Anemia work up No results for input(s): "VITAMINB12", "FOLATE", "FERRITIN", "TIBC", "IRON", "RETICCTPCT" in  the last 72 hours. Urinalysis    Component  Value Date/Time   COLORURINE YELLOW (A) 03/17/2022 0250   APPEARANCEUR HAZY (A) 03/17/2022 0250   APPEARANCEUR Clear 08/10/2011 0226   LABSPEC 1.013 03/17/2022 0250   LABSPEC 1.025 08/10/2011 0226   PHURINE 5.0 03/17/2022 0250   GLUCOSEU NEGATIVE 03/17/2022 0250   GLUCOSEU Negative 08/10/2011 0226   HGBUR MODERATE (A) 03/17/2022 0250   BILIRUBINUR NEGATIVE 03/17/2022 0250   BILIRUBINUR Negative 08/10/2011 0226   KETONESUR NEGATIVE 03/17/2022 0250   PROTEINUR NEGATIVE 03/17/2022 0250   NITRITE NEGATIVE 03/17/2022 0250   LEUKOCYTESUR NEGATIVE 03/17/2022 0250   LEUKOCYTESUR Negative 08/10/2011 0226   Sepsis Labs Recent Labs  Lab 03/17/22 0250 03/18/22 0336 03/20/22 0418  WBC 8.0 5.5 14.7*   Microbiology Recent Results (from the past 240 hour(s))  Urine Culture     Status: None   Collection Time: 03/17/22  2:50 AM   Specimen: Urine, Catheterized  Result Value Ref Range Status   Specimen Description   Final    URINE, CATHETERIZED Performed at [facility], [address] [place], [place] [address]    Special Requests   Final    NONE Performed at [facility], [address], [place], [place] [address]    Culture   Final    NO GROWTH Performed at [facility], [place] [address], [place], [place] [address]    Report Status 03/18/2022 FINAL  Final  Resp panel by RT-PCR (RSV, Flu A&B, Covid) Anterior Nasal Swab     Status: Abnormal   Collection Time: 03/17/22  2:51 AM   Specimen: Anterior Nasal Swab  Result Value Ref Range Status   SARS Coronavirus 2 by RT PCR POSITIVE (A) NEGATIVE Final   Influenza A by PCR NEGATIVE NEGATIVE Final   Influenza B by PCR NEGATIVE NEGATIVE Final   Resp Syncytial Virus by PCR NEGATIVE NEGATIVE Final    Comment: Performed at [facility], [place] [place],  [address]   Imaging CT HEAD WO CONTRAST (5MM)  Result Date: 03/17/2022 CLINICAL DATA:  Mental status change with unknown cause EXAM: CT  HEAD WITHOUT CONTRAST TECHNIQUE: Contiguous axial images were obtained from the base of the skull through the vertex without intravenous contrast. RADIATION DOSE REDUCTION: This exam was performed according to the departmental dose-optimization program which includes automated exposure control, adjustment of the mA and/or kV according to patient size and/or use of iterative reconstruction technique. COMPARISON:  06/15/2020 FINDINGS: Brain: No evidence of acute infarction, hemorrhage, hydrocephalus, extra-axial collection or mass lesion/mass effect. Generalized atrophy with variable sulcal widening that is unchanged in pattern. Confluent chronic small vessel ischemia in the deep cerebral white matter. Vascular: No hyperdense vessel or unexpected calcification. Skull: Normal. Negative for fracture or focal lesion. Sinuses/Orbits: Patchy bilateral sinus opacification with left maxillary, bilateral sphenoid, and frontal sinus fluid levels. IMPRESSION: 1. No acute intracranial finding. Brain atrophy and chronic small vessel ischemia. 2. Active bilateral sinusitis with fluid levels. Electronically Signed   By: Jorje Guild M.D.   On: 03/17/2022 04:39   DG Chest Portable 1 View  Result Date: 03/17/2022 CLINICAL DATA:  Dyspnea.  Difficulty breathing. EXAM: PORTABLE CHEST 1 VIEW COMPARISON:  06/15/2020 FINDINGS: Shallow inspiration. Cardiac enlargement. Peribronchial thickening with perihilar interstitial changes similar to prior study, likely representing chronic bronchitis and fibrosis. No developing consolidation or airspace disease. No pleural effusions. No pneumothorax. Mediastinal contours appear intact. Calcification of the aorta. IMPRESSION: Peribronchial thickening with central perihilar interstitial  changes similar to prior study, likely chronic bronchitic changes and chronic fibrosis. No developing consolidation. Electronically Signed   By: Lucienne Capers M.D.   On: 03/17/2022 03:23      Time  coordinating discharge: over 30 minutes  SIGNED:  Emeterio Reeve DO [facility]

## 2022-03-20 NOTE — Care Management Important Message (Signed)
Important Message  Patient Details  Name: Virginia Clements MRN: [id number] Date of Birth: 04/02/1930   Medicare Important Message Given:  N/A - LOS <3 / Initial given by admissions     [name] A [name] 03/20/2022, 8:02 AM

## 2022-03-20 NOTE — Progress Notes (Signed)
Pt HR on tele 132/64 SB in 40s - lowest rate at 38.  MD notified.  Will continue to monitor.

## 2022-03-20 NOTE — Progress Notes (Signed)
   03/20/22 1632  Medical Necessity for Transport Certificate --- IF THIS TRANSPORT IS ROUND TRIP OR SCHEDULED AND REPEATED, A PHYSICIAN MUST COMPLETE THIS FORM  Transport from: Teacher, English as a foreign language) Doctor, hospital to (Location) Other (Comment) ([address] [place], [place] [address])  Did the patient arrive from a [place], [place] or Group Home? No  Is this the closest appropriate facility? No  Date of Transport Service 03/20/22  Name of [facility]  Round Trip Transport? No  Reason for Transport Discharge  Describe the Medical Condition acute hypoxemic respiratory failure due to COVID-19, benign essential HTN, UTI, depression, acute metabolic encephalopathy  Q1 Are ALL the following "true"? 1. Patient unable to get up from bed without assistance  AND  2. Unable to ambulate  AND  3. Unable to sit in a chair, including wheelchair. Yes  Q2 Could the patient be transported safely by other means of transportation (I.E., wheelchair van)? No  Q3 Please check any of the following conditions that apply at the time of transport: Requires continuous oxygen;Risk of injury to self and/or others;Isolation precautions  Electronic Signature [place] DP  Date Signed 03/20/22

## 2022-03-20 NOTE — Progress Notes (Signed)
OT Cancellation Note  Patient Details Name: Virginia Clements MRN: [id number] DOB: Jun 26, 1929   Cancelled Treatment:    Reason Eval/Treat Not Completed: OT screened, no needs identified, will sign off. Per chart review, pt is bed bound and lives at home with family who care for her. No skilled acute OT need at this time. Per staff report, family plans on pt returning home at hospital discharge. OT to sign off.   Darleen Crocker, MS, OTR/L , CBIS ascom [phone]  03/20/22, 8:46 AM

## 2022-03-20 NOTE — Plan of Care (Signed)

## 2022-03-21 ENCOUNTER — Inpatient Hospital Stay
Admit: 2022-03-21 | Discharge: 2022-03-21 | Disposition: A | Discharge status: Home / Self Care | Payer: Medicare Other | Attending: Osteopathic Medicine | Admitting: Osteopathic Medicine

## 2022-03-21 LAB — ECHOCARDIOGRAM COMPLETE
AR max vel: 3.21 cm2
AV Area VTI: 4.02 cm2
AV Area mean vel: 3.57 cm2
AV Mean grad: 4 mmHg
AV Peak grad: 7.4 mmHg
Ao pk vel: 1.36 m/s
Area-P 1/2: 2.46 cm2
Height: 65 in
S' Lateral: 2.7 cm
Weight: 2640 oz

## 2022-03-21 NOTE — Progress Notes (Signed)
Ems has been called for pt at 8:08 am

## 2022-03-21 NOTE — Progress Notes (Signed)
*  PRELIMINARY RESULTS* Echocardiogram 2D Echocardiogram has been performed.  Virginia Clements 03/21/2022, 8:16 AM

## 2022-03-21 NOTE — Progress Notes (Signed)
   03/21/22 0845  Medical Necessity for Transport Certificate --- IF THIS TRANSPORT IS ROUND TRIP OR SCHEDULED AND REPEATED, A PHYSICIAN MUST COMPLETE THIS FORM  Transport from: Teacher, English as a foreign language) Doctor, hospital to (Location) Other (Comment) ([address] [place], [place] [address])  Did the patient arrive from a [place], [place] or Group Home? No  Is this the closest appropriate facility? No  Why is the transport to more distant facility required? NA  Date of Transport Service 03/21/22  Name of [facility]  Round Trip Transport? No  Reason for Transport Discharge  Is this a hospice patient? No  Describe the Medical Condition acute hypoxemic respiratory failure due to COVID-19, benign essential HTN, UTI, depression, acute metabolic encephalopathy  Q1 Are ALL the following "true"? 1. Patient unable to get up from bed without assistance  AND  2. Unable to ambulate  AND  3. Unable to sit in a chair, including wheelchair. Yes  Q2 Could the patient be transported safely by other means of transportation (I.E., wheelchair van)? No  Q3 Please check any of the following conditions that apply at the time of transport: Isolation precautions;Requires continuous oxygen;Risk of injury to self and/or others  Electronic Signature Colen Darling  Cred[employer] DP  Date Signed 03/21/22

## 2022-04-12 ENCOUNTER — Other Ambulatory Visit (HOSPITAL_BASED_OUTPATIENT_CLINIC_OR_DEPARTMENT_OTHER): Payer: Self-pay | Admitting: Osteopathic Medicine

## 2022-06-09 ENCOUNTER — Other Ambulatory Visit (HOSPITAL_BASED_OUTPATIENT_CLINIC_OR_DEPARTMENT_OTHER): Payer: Self-pay | Admitting: Osteopathic Medicine

## 2023-01-10 ENCOUNTER — Other Ambulatory Visit: Payer: Self-pay

## 2023-01-10 DIAGNOSIS — I480 Paroxysmal atrial fibrillation: Secondary | ICD-10-CM | POA: Diagnosis present

## 2023-01-10 DIAGNOSIS — I700 Atherosclerosis of aorta: Secondary | ICD-10-CM | POA: Diagnosis present

## 2023-01-10 DIAGNOSIS — G4710 Hypersomnia, unspecified: Secondary | ICD-10-CM | POA: Diagnosis present

## 2023-01-10 DIAGNOSIS — Z87891 Personal history of nicotine dependence: Secondary | ICD-10-CM

## 2023-01-10 DIAGNOSIS — T502X5A Adverse effect of carbonic-anhydrase inhibitors, benzothiadiazides and other diuretics, initial encounter: Secondary | ICD-10-CM | POA: Diagnosis present

## 2023-01-10 DIAGNOSIS — Z1152 Encounter for screening for COVID-19: Secondary | ICD-10-CM

## 2023-01-10 DIAGNOSIS — Z885 Allergy status to narcotic agent status: Secondary | ICD-10-CM

## 2023-01-10 DIAGNOSIS — E669 Obesity, unspecified: Secondary | ICD-10-CM | POA: Diagnosis present

## 2023-01-10 DIAGNOSIS — J439 Emphysema, unspecified: Secondary | ICD-10-CM | POA: Diagnosis present

## 2023-01-10 DIAGNOSIS — R0602 Shortness of breath: Secondary | ICD-10-CM | POA: Diagnosis not present

## 2023-01-10 DIAGNOSIS — Z853 Personal history of malignant neoplasm of breast: Secondary | ICD-10-CM

## 2023-01-10 DIAGNOSIS — Z87440 Personal history of urinary (tract) infections: Secondary | ICD-10-CM

## 2023-01-10 DIAGNOSIS — E871 Hypo-osmolality and hyponatremia: Secondary | ICD-10-CM | POA: Diagnosis present

## 2023-01-10 DIAGNOSIS — Z888 Allergy status to other drugs, medicaments and biological substances status: Secondary | ICD-10-CM

## 2023-01-10 DIAGNOSIS — Z8616 Personal history of COVID-19: Secondary | ICD-10-CM

## 2023-01-10 DIAGNOSIS — Z79899 Other long term (current) drug therapy: Secondary | ICD-10-CM

## 2023-01-10 DIAGNOSIS — I110 Hypertensive heart disease with heart failure: Principal | ICD-10-CM | POA: Diagnosis present

## 2023-01-10 DIAGNOSIS — E874 Mixed disorder of acid-base balance: Secondary | ICD-10-CM | POA: Diagnosis present

## 2023-01-10 DIAGNOSIS — I272 Pulmonary hypertension, unspecified: Secondary | ICD-10-CM | POA: Diagnosis present

## 2023-01-10 DIAGNOSIS — I5033 Acute on chronic diastolic (congestive) heart failure: Secondary | ICD-10-CM | POA: Diagnosis present

## 2023-01-10 DIAGNOSIS — Z923 Personal history of irradiation: Secondary | ICD-10-CM

## 2023-01-10 DIAGNOSIS — J9601 Acute respiratory failure with hypoxia: Secondary | ICD-10-CM | POA: Diagnosis present

## 2023-01-10 DIAGNOSIS — G4733 Obstructive sleep apnea (adult) (pediatric): Secondary | ICD-10-CM | POA: Diagnosis present

## 2023-01-10 DIAGNOSIS — N390 Urinary tract infection, site not specified: Secondary | ICD-10-CM | POA: Diagnosis present

## 2023-01-10 DIAGNOSIS — G9341 Metabolic encephalopathy: Secondary | ICD-10-CM | POA: Diagnosis present

## 2023-01-10 DIAGNOSIS — Z555 Less than a high school diploma: Secondary | ICD-10-CM

## 2023-01-10 DIAGNOSIS — Z993 Dependence on wheelchair: Secondary | ICD-10-CM

## 2023-01-10 DIAGNOSIS — Z23 Encounter for immunization: Secondary | ICD-10-CM

## 2023-01-10 DIAGNOSIS — J9602 Acute respiratory failure with hypercapnia: Secondary | ICD-10-CM | POA: Diagnosis present

## 2023-01-10 DIAGNOSIS — F32A Depression, unspecified: Secondary | ICD-10-CM | POA: Diagnosis present

## 2023-01-10 DIAGNOSIS — Z9221 Personal history of antineoplastic chemotherapy: Secondary | ICD-10-CM

## 2023-01-10 DIAGNOSIS — Z8249 Family history of ischemic heart disease and other diseases of the circulatory system: Secondary | ICD-10-CM

## 2023-01-10 DIAGNOSIS — Z8619 Personal history of other infectious and parasitic diseases: Secondary | ICD-10-CM

## 2023-01-10 DIAGNOSIS — Z66 Do not resuscitate: Secondary | ICD-10-CM | POA: Diagnosis present

## 2023-01-10 LAB — COMPREHENSIVE METABOLIC PANEL
ALT: 13 U/L (ref 0–44)
AST: 14 U/L — ABNORMAL LOW (ref 15–41)
Albumin: 3.8 g/dL (ref 3.5–5.0)
Alkaline Phosphatase: 76 U/L (ref 38–126)
Anion gap: 9 (ref 5–15)
BUN: 12 mg/dL (ref 8–23)
CO2: 30 mmol/L (ref 22–32)
Calcium: 9.7 mg/dL (ref 8.9–10.3)
Chloride: 88 mmol/L — ABNORMAL LOW (ref 98–111)
Creatinine, Ser: 0.49 mg/dL (ref 0.44–1.00)
GFR, Estimated: >60 mL/min (ref >60)
Glucose, Bld: 118 mg/dL — ABNORMAL HIGH (ref 70–99)
Potassium: 3.6 mmol/L (ref 3.5–5.1)
Sodium: 127 mmol/L — ABNORMAL LOW (ref 135–145)
Total Bilirubin: 0.8 mg/dL (ref 0.3–1.2)
Total Protein: 6.8 g/dL (ref 6.5–8.1)

## 2023-01-10 LAB — CBC WITH DIFFERENTIAL/PLATELET
Abs Immature Granulocytes: 0.05 10*3/uL (ref 0.00–0.07)
Basophils Absolute: 0.1 10*3/uL (ref 0.0–0.1)
Basophils Relative: 1 %
Eosinophils Absolute: 0.2 10*3/uL (ref 0.0–0.5)
Eosinophils Relative: 2 %
HCT: 45.4 % (ref 36.0–46.0)
Hemoglobin: 15 g/dL (ref 12.0–15.0)
Immature Granulocytes: 1 %
Lymphocytes Relative: 15 %
Lymphs Abs: 1.5 10*3/uL (ref 0.7–4.0)
MCH: 30.9 pg (ref 26.0–34.0)
MCHC: 33 g/dL (ref 30.0–36.0)
MCV: 93.6 fL (ref 80.0–100.0)
Monocytes Absolute: 0.6 10*3/uL (ref 0.1–1.0)
Monocytes Relative: 5 %
Neutro Abs: 7.8 10*3/uL — ABNORMAL HIGH (ref 1.7–7.7)
Neutrophils Relative %: 76 %
Platelets: 268 10*3/uL (ref 150–400)
RBC: 4.85 MIL/uL (ref 3.87–5.11)
RDW: 13 % (ref 11.5–15.5)
WBC: 10.1 10*3/uL (ref 4.0–10.5)
nRBC: 0 % (ref 0.0–0.2)

## 2023-01-10 NOTE — ED Triage Notes (Signed)
EMS brings pt in from home for decreased urinary output, odorous urine; O2 placed at 4l/min via [place] for sat 82% on ra on scene

## 2023-01-10 NOTE — ED Triage Notes (Signed)
BIB [facility] from home. C/o decreased urinary output and foul smelling urine. Pt reportedly hypoxic on scene and placed on 4L [place]. No family present for further background.

## 2023-01-11 ENCOUNTER — Encounter: Payer: Self-pay | Admitting: Internal Medicine

## 2023-01-11 ENCOUNTER — Emergency Department: Payer: Medicare Other

## 2023-01-11 ENCOUNTER — Inpatient Hospital Stay
Admission: EM | Admit: 2023-01-11 | Discharge: 2023-01-15 | DRG: 291 | Disposition: A | Discharge status: Home / Self Care | Payer: Medicare Other | Attending: Internal Medicine | Admitting: Internal Medicine

## 2023-01-11 ENCOUNTER — Other Ambulatory Visit: Payer: Self-pay

## 2023-01-11 DIAGNOSIS — R0902 Hypoxemia: Secondary | ICD-10-CM | POA: Diagnosis not present

## 2023-01-11 DIAGNOSIS — I5031 Acute diastolic (congestive) heart failure: Secondary | ICD-10-CM | POA: Diagnosis not present

## 2023-01-11 DIAGNOSIS — N390 Urinary tract infection, site not specified: Secondary | ICD-10-CM | POA: Diagnosis present

## 2023-01-11 DIAGNOSIS — I700 Atherosclerosis of aorta: Secondary | ICD-10-CM | POA: Diagnosis present

## 2023-01-11 DIAGNOSIS — E871 Hypo-osmolality and hyponatremia: Secondary | ICD-10-CM | POA: Diagnosis present

## 2023-01-11 DIAGNOSIS — I10 Essential (primary) hypertension: Secondary | ICD-10-CM | POA: Diagnosis present

## 2023-01-11 DIAGNOSIS — E669 Obesity, unspecified: Secondary | ICD-10-CM | POA: Diagnosis present

## 2023-01-11 DIAGNOSIS — G9340 Encephalopathy, unspecified: Secondary | ICD-10-CM | POA: Insufficient documentation

## 2023-01-11 DIAGNOSIS — Z1152 Encounter for screening for COVID-19: Secondary | ICD-10-CM | POA: Diagnosis not present

## 2023-01-11 DIAGNOSIS — E874 Mixed disorder of acid-base balance: Secondary | ICD-10-CM | POA: Diagnosis present

## 2023-01-11 DIAGNOSIS — J9602 Acute respiratory failure with hypercapnia: Secondary | ICD-10-CM | POA: Diagnosis present

## 2023-01-11 DIAGNOSIS — I272 Pulmonary hypertension, unspecified: Secondary | ICD-10-CM | POA: Diagnosis present

## 2023-01-11 DIAGNOSIS — Z8249 Family history of ischemic heart disease and other diseases of the circulatory system: Secondary | ICD-10-CM | POA: Diagnosis not present

## 2023-01-11 DIAGNOSIS — Z66 Do not resuscitate: Secondary | ICD-10-CM | POA: Diagnosis present

## 2023-01-11 DIAGNOSIS — Z79899 Other long term (current) drug therapy: Secondary | ICD-10-CM | POA: Diagnosis not present

## 2023-01-11 DIAGNOSIS — I110 Hypertensive heart disease with heart failure: Secondary | ICD-10-CM | POA: Diagnosis present

## 2023-01-11 DIAGNOSIS — I509 Heart failure, unspecified: Secondary | ICD-10-CM | POA: Diagnosis not present

## 2023-01-11 DIAGNOSIS — G4710 Hypersomnia, unspecified: Secondary | ICD-10-CM | POA: Diagnosis present

## 2023-01-11 DIAGNOSIS — T502X5A Adverse effect of carbonic-anhydrase inhibitors, benzothiadiazides and other diuretics, initial encounter: Secondary | ICD-10-CM | POA: Diagnosis present

## 2023-01-11 DIAGNOSIS — J9601 Acute respiratory failure with hypoxia: Secondary | ICD-10-CM | POA: Diagnosis present

## 2023-01-11 DIAGNOSIS — J439 Emphysema, unspecified: Secondary | ICD-10-CM | POA: Diagnosis present

## 2023-01-11 DIAGNOSIS — G4733 Obstructive sleep apnea (adult) (pediatric): Secondary | ICD-10-CM | POA: Diagnosis present

## 2023-01-11 DIAGNOSIS — I480 Paroxysmal atrial fibrillation: Secondary | ICD-10-CM | POA: Diagnosis present

## 2023-01-11 DIAGNOSIS — Z8616 Personal history of COVID-19: Secondary | ICD-10-CM | POA: Diagnosis not present

## 2023-01-11 DIAGNOSIS — Z87891 Personal history of nicotine dependence: Secondary | ICD-10-CM | POA: Diagnosis not present

## 2023-01-11 DIAGNOSIS — R0602 Shortness of breath: Secondary | ICD-10-CM | POA: Diagnosis present

## 2023-01-11 DIAGNOSIS — I5033 Acute on chronic diastolic (congestive) heart failure: Secondary | ICD-10-CM | POA: Diagnosis present

## 2023-01-11 DIAGNOSIS — B9689 Other specified bacterial agents as the cause of diseases classified elsewhere: Secondary | ICD-10-CM | POA: Diagnosis present

## 2023-01-11 DIAGNOSIS — R300 Dysuria: Secondary | ICD-10-CM

## 2023-01-11 DIAGNOSIS — F32A Depression, unspecified: Secondary | ICD-10-CM | POA: Diagnosis present

## 2023-01-11 DIAGNOSIS — Z23 Encounter for immunization: Secondary | ICD-10-CM | POA: Diagnosis present

## 2023-01-11 DIAGNOSIS — G9341 Metabolic encephalopathy: Secondary | ICD-10-CM | POA: Diagnosis present

## 2023-01-11 HISTORY — DX: Paroxysmal atrial fibrillation: I48.0

## 2023-01-11 LAB — BLOOD GAS, VENOUS
Acid-Base Excess: 9.7 mmol/L — ABNORMAL HIGH (ref 0.0–2.0)
Acid-Base Excess: 10.9 mmol/L — ABNORMAL HIGH (ref 0.0–2.0)
Acid-Base Excess: 13.1 mmol/L — ABNORMAL HIGH (ref 0.0–2.0)
Bicarbonate: 40.8 mmol/L — ABNORMAL HIGH (ref 20.0–28.0)
Bicarbonate: 41.8 mmol/L — ABNORMAL HIGH (ref 20.0–28.0)
Bicarbonate: 42.6 mmol/L — ABNORMAL HIGH (ref 20.0–28.0)
Delivery systems: POSITIVE
Delivery systems: POSITIVE
FIO2: 40 %
FIO2: 40 %
Mechanical Rate: 10
Mechanical Rate: 16
O2 Saturation: 76.3 %
O2 Saturation: 67.8 %
O2 Saturation: 76.9 %
Patient temperature: 37
Patient temperature: 37
Patient temperature: 37
pCO2, Ven: 93 mm[Hg] (ref 44–60)
pCO2, Ven: 91 mm[Hg] (ref 44–60)
pCO2, Ven: 79 mm[Hg] (ref 44–60)
pH, Ven: 7.25 (ref 7.25–7.43)
pH, Ven: 7.27 (ref 7.25–7.43)
pH, Ven: 7.34 (ref 7.25–7.43)
pO2, Ven: 44 mm[Hg] (ref 32–45)
pO2, Ven: 42 mm[Hg] (ref 32–45)
pO2, Ven: 45 mm[Hg] (ref 32–45)

## 2023-01-11 LAB — CREATININE, SERUM
Creatinine, Ser: 0.52 mg/dL (ref 0.44–1.00)
GFR, Estimated: >60 mL/min (ref >60)

## 2023-01-11 LAB — URINALYSIS, ROUTINE W REFLEX MICROSCOPIC
Bilirubin Urine: NEGATIVE
Glucose, UA: NEGATIVE mg/dL
Hgb urine dipstick: NEGATIVE
Ketones, ur: NEGATIVE mg/dL
Leukocytes,Ua: NEGATIVE
Nitrite: NEGATIVE
Protein, ur: NEGATIVE mg/dL
Specific Gravity, Urine: 1.033 — ABNORMAL HIGH (ref 1.005–1.030)
pH: 5 (ref 5.0–8.0)

## 2023-01-11 LAB — CBC
HCT: 43.5 % (ref 36.0–46.0)
Hemoglobin: 14.7 g/dL (ref 12.0–15.0)
MCH: 30.9 pg (ref 26.0–34.0)
MCHC: 33.8 g/dL (ref 30.0–36.0)
MCV: 91.6 fL (ref 80.0–100.0)
Platelets: 285 10*3/uL (ref 150–400)
RBC: 4.75 MIL/uL (ref 3.87–5.11)
RDW: 12.5 % (ref 11.5–15.5)
WBC: 10.5 10*3/uL (ref 4.0–10.5)
nRBC: 0 % (ref 0.0–0.2)

## 2023-01-11 LAB — TROPONIN I (HIGH SENSITIVITY)
Troponin I (High Sensitivity): 11 ng/L (ref <18)
Troponin I (High Sensitivity): 10 ng/L (ref <18)

## 2023-01-11 LAB — AMMONIA: Ammonia: 17 umol/L (ref 9–35)

## 2023-01-11 LAB — SARS CORONAVIRUS 2 BY RT PCR: SARS Coronavirus 2 by RT PCR: NEGATIVE

## 2023-01-11 LAB — BRAIN NATRIURETIC PEPTIDE: B Natriuretic Peptide: 93.1 pg/mL (ref 0.0–100.0)

## 2023-01-11 MED ORDER — ONDANSETRON HCL 4 MG PO TABS: 4.0000 mg | ORAL_TABLET | Freq: Four times a day (QID) | ORAL | Status: DC | PRN

## 2023-01-11 MED ORDER — ACETAMINOPHEN 650 MG RE SUPP: 650.0000 mg | Freq: Four times a day (QID) | RECTAL | Status: DC | PRN

## 2023-01-11 MED ORDER — ACETAMINOPHEN 325 MG PO TABS: 650.0000 mg | ORAL_TABLET | Freq: Four times a day (QID) | ORAL | Status: DC | PRN

## 2023-01-11 MED ORDER — ENOXAPARIN SODIUM 60 MG/0.6ML IJ SOSY
0.5000 mg/kg | PREFILLED_SYRINGE | INTRAMUSCULAR | Status: DC
  Administered 2023-01-11 – 2023-01-15 (×5): 45 mg via SUBCUTANEOUS
  Filled 2023-01-11 (×5): qty 0.6

## 2023-01-11 MED ORDER — SODIUM CHLORIDE 0.9 % IV SOLN
2.0000 g | INTRAVENOUS | Status: DC
  Administered 2023-01-11 – 2023-01-13 (×3): 2 g via INTRAVENOUS
  Filled 2023-01-11 (×4): qty 20

## 2023-01-11 MED ORDER — ACETAMINOPHEN 325 MG PO TABS
650.0000 mg | ORAL_TABLET | Freq: Four times a day (QID) | ORAL | Status: DC | PRN
  Administered 2023-01-12 – 2023-01-14 (×4): 650 mg via ORAL
  Filled 2023-01-11 (×4): qty 2

## 2023-01-11 MED ORDER — AMLODIPINE BESYLATE 10 MG PO TABS
10.0000 mg | ORAL_TABLET | Freq: Every day | ORAL | Status: DC
  Administered 2023-01-11 – 2023-01-15 (×5): 10 mg via ORAL
  Filled 2023-01-11 (×2): qty 1
  Filled 2023-01-11: qty 2
  Filled 2023-01-11 (×2): qty 1

## 2023-01-11 MED ORDER — ENOXAPARIN SODIUM 40 MG/0.4ML IJ SOSY: 40.0000 mg | PREFILLED_SYRINGE | INTRAMUSCULAR | Status: DC

## 2023-01-11 MED ORDER — IOHEXOL 350 MG/ML SOLN
100.0000 mL | Freq: Once | INTRAVENOUS | Status: AC | PRN
  Administered 2023-01-11: 100 mL via INTRAVENOUS

## 2023-01-11 MED ORDER — ONDANSETRON HCL 4 MG/2ML IJ SOLN: 4.0000 mg | Freq: Four times a day (QID) | INTRAMUSCULAR | Status: DC | PRN

## 2023-01-11 MED ORDER — FUROSEMIDE 10 MG/ML IJ SOLN
40.0000 mg | Freq: Once | INTRAMUSCULAR | Status: AC
  Administered 2023-01-11: 40 mg via INTRAVENOUS
  Filled 2023-01-11: qty 4

## 2023-01-11 MED ORDER — ENALAPRIL MALEATE 10 MG PO TABS
10.0000 mg | ORAL_TABLET | Freq: Every day | ORAL | Status: DC
  Administered 2023-01-11 – 2023-01-15 (×5): 10 mg via ORAL
  Filled 2023-01-11 (×5): qty 1

## 2023-01-11 MED ORDER — ALBUTEROL SULFATE (2.5 MG/3ML) 0.083% IN NEBU: 2.5000 mg | INHALATION_SOLUTION | RESPIRATORY_TRACT | Status: DC | PRN

## 2023-01-11 MED ORDER — METOPROLOL TARTRATE 25 MG PO TABS
12.5000 mg | ORAL_TABLET | Freq: Two times a day (BID) | ORAL | Status: DC
  Administered 2023-01-11 – 2023-01-15 (×7): 12.5 mg via ORAL
  Filled 2023-01-11 (×7): qty 1

## 2023-01-11 MED ORDER — FUROSEMIDE 10 MG/ML IJ SOLN
20.0000 mg | Freq: Two times a day (BID) | INTRAMUSCULAR | Status: DC
  Administered 2023-01-11 – 2023-01-12 (×3): 20 mg via INTRAVENOUS
  Filled 2023-01-11 (×2): qty 4
  Filled 2023-01-11: qty 2

## 2023-01-11 NOTE — Progress Notes (Addendum)
Patient reassessed at the bedside as well as discussed with nursing Looks to be moving air more appropriately with adjusted settings on BiPAP CO2 appears to be slowly trending in the right direction 1.5 L urine output thus far Will give additional dose of IV Lasix x 1 as patient does appear to still have a fair amount of fluid retention Will continue with BiPAP for now Repeat VBG in the next 2 to 3 hours Nursing okay to monitor respiratory status for now is it does appear to be improving Will otherwise follow closely  Greater than 50% was spent in counseling, critical care time and coordination of care with patient Total encounter time 35 minutes or more

## 2023-01-11 NOTE — Progress Notes (Signed)
+   hypercarbia on VBG  Will give trial of BIPAP  Follow

## 2023-01-11 NOTE — Assessment & Plan Note (Signed)
Remote history of atrial fibrillation during hospitalization 03/2022-converted with IV metoprolol EKG normal sinus rhythm today Not felt to be a anticoagulation candidate during that admission Cardiology consulted Monitor

## 2023-01-11 NOTE — ED Notes (Addendum)
Respiratory called with critical CO2 88. MD made aware.

## 2023-01-11 NOTE — Progress Notes (Signed)
OT Cancellation Note  Patient Details Name: Virginia Clements MRN: [id number] DOB: 1929-07-10   Cancelled Treatment:    Reason Eval/Treat Not Completed: OT screened, no needs identified, will sign off. Pt's last admission with therapy orders was in Dec 2023 and at that time pt was bed bound and living at home with sister and brother in law. Family assisting with all her needs from bed level. Pt does not need skilled OT intervention at this time as she is currently at functional baseline. OT to complete orders.   Jackquline Denmark, MS, OTR/L , CBIS ascom [phone]  01/11/23, 1:54 PM

## 2023-01-11 NOTE — Progress Notes (Signed)
PT Cancellation Note  Patient Details Name: Virginia Clements MRN: [id number] DOB: 1929-08-16   Cancelled Treatment:    Reason Eval/Treat Not Completed: Other (comment). Per chart review, pt's last admission with therapy orders was in Dec 2023 and at that time pt was bed bound and living at home with sister and brother in law. Family assisting with all her needs from bed level and has all DME in home. Pt currently at baseline level and not a rehab candidate. Will dc orders at this time.     Aayushi Solorzano 01/11/2023, 2:05 PM Elizabeth Palau, PT, DPT, GCS [phone]

## 2023-01-11 NOTE — ED Notes (Signed)
MD made aware of critical pCO2 91

## 2023-01-11 NOTE — H&P (Addendum)
History and Physical    Patient: Virginia Clements NGE:[id number] DOB: 1929/08/20 DOA: 01/11/2023 DOS: the patient was seen and examined on 01/11/2023 PCP: Gracelyn Nurse, MD  Patient coming from: Home  Chief Complaint:  Chief Complaint  Patient presents with   Dysuria   HPI: Virginia Clements is a 89 y.o. female with medical history significant of wheelchair-bound, HFpEF, episode of atrial fibrillation, hypertension, breast cancer status post radiation therapy, depression presenting with encephalopathy, acute on chronic HFpEF,?  UTI.  Limited history in the setting of encephalopathy.  Per report, patient with decreased urine output at home as well as odorous urine.  EMS evaluated the patient at home with O2 being placed to 4 L to keep O2 sats above 80% on room air.  No reports of fevers or chills.  No reports of nausea vomiting, chest pain or shortness of breath.  Noted admission December 2023 with episode of paroxysmal A-fib with self resolved after IV metoprolol x 1 as well as diagnosis of grade 1 diastolic dysfunction on 2D echo during that admission.  Unclear if patient has had any cardiology follow-up from this point. Presented to the ER afebrile, hemodynamically stable.  Satting in the low 80s on room air.  Transition to 4 L nasal cannula to keep O2 sats greater than 97%.  White count 10.1, hemoglobin 15, platelets 268, troponin negative x 1, urinalysis minimally to indicative of infection, COVID flu and RSV negative, creatinine 0.5.  BNP within normal limits.  CT of the chest negative for PE but does show changes consistent with pulmonary edema as well as pulmonary hypertension. Review of Systems: As mentioned in the history of present illness. All other systems reviewed and are negative. Past Medical History:  Diagnosis Date   Breast cancer (HCC)    Hypertension    Hypoglycemia    Radiation 2009   Past Surgical History:  Procedure Laterality Date   BREAST BIOPSY  Right 2009   Social History:  reports that she has quit smoking. She has never used smokeless tobacco. She reports that she does not currently use alcohol. She reports that she does not use drugs.  Allergies  Allergen Reactions   Alendronate Other (See Comments)    Nervousness and shakiness per pt   Codeine     dizziness    Family History  Problem Relation Age of Onset   Hypertension Mother     Prior to Admission medications   Medication Sig Start Date End Date Taking? Authorizing Provider  albuterol (VENTOLIN HFA) 108 (90 Base) MCG/ACT inhaler Inhale 2 puffs into the lungs every 4 (four) hours as needed for wheezing or shortness of breath. 03/20/22  Yes Sunnie Nielsen, DO  amLODipine (NORVASC) 10 MG tablet Take 1 tablet (10 mg total) by mouth daily. 06/05/18  Yes Altamese Dilling, MD  busPIRone (BUSPAR) 10 MG tablet Take 10 mg by mouth 2 (two) times daily.   Yes [provider]  enalapril (VASOTEC) 10 MG tablet Take 10 mg by mouth daily.   Yes [provider]  fexofenadine (ALLEGRA) 180 MG tablet Take 180 mg by mouth daily. 12/19/22 12/19/23 Yes [provider]  Infant Care Products [facility]) OINT Apply 1 Application topically daily. 01/12/22  Yes [provider]  metoprolol tartrate (LOPRESSOR) 25 MG tablet Take 0.5 tablets (12.5 mg total) by mouth 2 (two) times daily. Patient taking differently: Take 25 mg by mouth 2 (two) times daily. 03/20/22  Yes Sunnie Nielsen, DO  mirtazapine (REMERON) 7.5 MG  tablet Take 7.5 mg by mouth at bedtime.   Yes [provider]  naproxen (NAPROSYN) 375 MG tablet Take 1 tablet (375 mg total) by mouth 2 (two) times daily as needed for mild pain or moderate pain. Patient taking differently: Take 375 mg by mouth 2 (two) times daily. 08/29/20  Yes Lewie Chamber, MD  timolol (TIMOPTIC-XR) 0.5 % ophthalmic gel-forming Place 1 drop into both eyes at bedtime. 07/06/14  Yes [provider]   acetaminophen (TYLENOL) 325 MG tablet Take 2 tablets (650 mg total) by mouth every 6 (six) hours as needed for mild pain (or Fever >/= 101). Patient not taking: Reported on 03/17/2022 06/04/18   Altamese Dilling, MD  famotidine (PEPCID) 20 MG tablet Take 1 tablet (20 mg total) by mouth 2 (two) times daily. Patient not taking: Reported on 01/11/2023 03/20/22   Sunnie Nielsen, DO  guaiFENesin-dextromethorphan (ROBITUSSIN DM) 100-10 MG/5ML syrup Take 10 mLs by mouth every 4 (four) hours as needed for cough. Patient not taking: Reported on 01/11/2023 03/20/22   Sunnie Nielsen, DO  HYDROcodone bit-homatropine (HYCODAN) 5-1.5 MG/5ML syrup Take 5 mLs by mouth every 6 (six) hours as needed. Patient not taking: Reported on 01/11/2023 03/14/22   [provider]  ipratropium (ATROVENT HFA) 17 MCG/ACT inhaler Inhale 2 puffs into the lungs every 4 (four) hours. Patient not taking: Reported on 01/11/2023 03/20/22   Sunnie Nielsen, DO  nirmatrelvir/ritonavir EUA, renal dosing, (PAXLOVID) 10 x 150 MG & 10 x 100MG  TABS Take 2 tablets by mouth 2 (two) times daily for 3 days. GFR >60. Take nirmatrelvir (150 mg) one tablet twice daily for 3 days and ritonavir (100 mg) one tablet twice daily for 3 days. 03/20/22 03/23/22  Sunnie Nielsen, DO  polyethylene glycol (MIRALAX / GLYCOLAX) 17 g packet Take 17 g by mouth daily as needed for mild constipation or moderate constipation. Patient not taking: Reported on 01/11/2023 08/29/20   Lewie Chamber, MD  zinc sulfate 220 (50 Zn) MG capsule Take 1 capsule (220 mg total) by mouth daily. Patient not taking: Reported on 01/11/2023 03/20/22   Sunnie Nielsen, DO    Physical Exam: Vitals:   01/11/23 0400 01/11/23 0500 01/11/23 0700 01/11/23 0800  BP: (!) 143/64 (!) 155/71 (!) 164/60 (!) 143/65  Pulse: 72 69 72 86  Resp:  17    Temp:      TempSrc:      SpO2: 98% 98% 99% 97%  Weight:      Height:       Physical Exam Constitutional:       Appearance: She is obese.  HENT:     Head: Normocephalic and atraumatic.     Nose: Nose normal.     Mouth/Throat:     Mouth: Mucous membranes are moist.  Eyes:     Pupils: Pupils are equal, round, and reactive to light.  Cardiovascular:     Rate and Rhythm: Normal rate and regular rhythm.  Pulmonary:     Effort: Pulmonary effort is normal.     Breath sounds: No wheezing.  Abdominal:     General: Bowel sounds are normal.  Musculoskeletal:        General: Normal range of motion.     Right lower leg: Edema present.     Left lower leg: Edema present.  Skin:    General: Skin is warm.  Neurological:     General: No focal deficit present.     Comments: Pt sleeping    Psychiatric:  Mood and Affect: Mood normal.     Data Reviewed:  There are no new results to review at this time. CT Angio Chest PE W and/or Wo Contrast CLINICAL DATA:  Shortness of breath, new hypoxia  EXAM: CT ANGIOGRAPHY CHEST WITH CONTRAST  TECHNIQUE: Multidetector CT imaging of the chest was performed using the standard protocol during bolus administration of intravenous contrast. Multiplanar CT image reconstructions and MIPs were obtained to evaluate the vascular anatomy.  RADIATION DOSE REDUCTION: This exam was performed according to the departmental dose-optimization program which includes automated exposure control, adjustment of the mA and/or kV according to patient size and/or use of iterative reconstruction technique.  CONTRAST:  OMNIPAQUE IOHEXOL 350 MG/ML SOLN  COMPARISON:  Chest radiograph 01/11/2023  FINDINGS: Cardiovascular: Negative for acute pulmonary embolism. No pericardial effusion. Coronary artery and aortic atherosclerotic calcification. Dilated main pulmonary artery measuring 34 mm in diameter can be seen with pulmonary hypertension.  Mediastinum/Nodes: Trachea and esophagus are unremarkable. No thoracic adenopathy.  Lungs/Pleura: Paraseptal emphysema. Patchy  bilateral airspace opacities and interlobular septal thickening. Mild diffuse bronchial wall thickening. Bibasilar atelectasis/scarring. No pleural effusion or pneumothorax.  Upper Abdomen: No acute abnormality.  Musculoskeletal: No acute fracture.  Review of the MIP images confirms the above findings.  IMPRESSION: 1. Negative for acute pulmonary embolism. 2. Patchy bilateral airspace opacities and interlobular septal thickening, favor pulmonary edema. 3. Dilated main pulmonary artery measuring 34 mm in diameter can be seen with pulmonary hypertension.  Aortic Atherosclerosis (ICD10-I70.0) and Emphysema (ICD10-J43.9).  Electronically Signed   By: Minerva Fester M.D.   On: 01/11/2023 02:51 DG Chest Portable 1 View CLINICAL DATA:  New hypoxia. decreased urinary output from SNF  EXAM: PORTABLE CHEST 1 VIEW.  Patient is rotated.  COMPARISON:  Chest x-ray 03/17/2022  FINDINGS: The heart and mediastinal contours are unchanged. Aortic calcification.  Low lung volumes. No focal consolidation. Chronic coarsened interstitial markings with no overt pulmonary edema. No pleural effusion. No pneumothorax.  No acute osseous abnormality.  IMPRESSION: 1. No active disease. 2.  Aortic Atherosclerosis (ICD10-I70.0).  Electronically Signed   By: Tish Frederickson M.D.   On: 01/11/2023 01:48  Lab Results  Component Value Date   WBC 10.5 01/11/2023   HGB 14.7 01/11/2023   HCT 43.5 01/11/2023   MCV 91.6 01/11/2023   PLT 285 01/11/2023   Last metabolic panel Lab Results  Component Value Date   GLUCOSE 118 (H) 01/10/2023   NA 127 (L) 01/10/2023   K 3.6 01/10/2023   CL 88 (L) 01/10/2023   CO2 30 01/10/2023   BUN 12 01/10/2023   CREATININE 0.52 01/11/2023   GFRNONAA >60 01/11/2023   CALCIUM 9.7 01/10/2023   PHOS 2.2 (L) 06/01/2018   PROT 6.8 01/10/2023   ALBUMIN 3.8 01/10/2023   BILITOT 0.8 01/10/2023   ALKPHOS 76 01/10/2023   AST 14 (L) 01/10/2023   ALT 13 01/10/2023    ANIONGAP 9 01/10/2023    Assessment and Plan: Benign essential HTN BP stable Titrate home regimen  UTI (urinary tract infection) Per report, patient with decreased urinary output and foul-smelling urine Urinalysis minimally indicative of infection Noted overlapping encephalopathy Given overall symptomatology, will cover prophylactically with IV Rocephin Urine culture Monitor  Encephalopathy Noted hypersomnolence on presentation-patient is snoring at the bedside Nonfocal neuroexam Suspect multifactorial in the setting of hypoxic respiratory failure, acute on chronic HFpEF ?  UTI with some component of general fatigue Will check VBG to rule out hypercarbia-suspect undiagnosed OHS  Ammonia level x 1  IV Rocephin for UTI coverage CT head as appropriate Otherwise monitor  Acute on chronic heart failure with preserved ejection fraction (HFpEF) (HCC) 2D echo 03/22/2022 with EF of 60 to 65% and grade 1 diastolic dysfunction Acute decompensation today with noted new onset hypoxia, pulmonary edema on imaging and 3+ pitting edema bilaterally IV Lasix Repeat 2D echo Strict ins and outs and daily weights Cardiology consulted Follow-up recommendations  Acute respiratory failure with hypoxia (HCC) Decompensated respiratory failure now requiring 4 L nasal cannula with concern for volume overload in the setting of acute on chronic HFpEF CTA negative PE, however showing pulmonary edema as well as pulmonary hypertension BNP within normal limits No overt signs of infectious process at present 2-3+ pitting edema bilaterally IV Lasix Strict ins and outs and daily weights Monitor respiratory status with diuresis Cardiology consulted    Paroxysmal atrial fibrillation (HCC) Remote history of atrial fibrillation during hospitalization 03/2022-converted with IV metoprolol EKG normal sinus rhythm today Not felt to be a anticoagulation candidate during that admission Cardiology  consulted Monitor   Greater than 50% was spent in counseling and coordination of care with patient Total encounter time 80 minutes or more    Advance Care Planning:   Code Status: Do not attempt resuscitation (DNR) PRE-ARREST INTERVENTIONS DESIRED   Consults: Cardiology   Family Communication: No family at the bedside   Severity of Illness: The appropriate patient status for this patient is INPATIENT. Inpatient status is judged to be reasonable and necessary in order to provide the required intensity of service to ensure the patient's safety. The patient's presenting symptoms, physical exam findings, and initial radiographic and laboratory data in the context of their chronic comorbidities is felt to place them at high risk for further clinical deterioration. Furthermore, it is not anticipated that the patient will be medically stable for discharge from the hospital within 2 midnights of admission.   * I certify that at the point of admission it is my clinical judgment that the patient will require inpatient hospital care spanning beyond 2 midnights from the point of admission due to high intensity of service, high risk for further deterioration and high frequency of surveillance required.*  Author: Floydene Flock, MD 01/11/2023 9:12 AM  For on call review [email or web address].

## 2023-01-11 NOTE — Assessment & Plan Note (Addendum)
Noted hypersomnolence on presentation-patient is snoring at the bedside Nonfocal neuroexam Suspect multifactorial in the setting of hypoxic respiratory failure, acute on chronic HFpEF ?  UTI with some component of general fatigue Will check VBG to rule out hypercarbia-suspect undiagnosed OHS  Ammonia level x 1 IV Rocephin for UTI coverage CT head as appropriate Otherwise monitor

## 2023-01-11 NOTE — ED Provider Notes (Signed)
[facility] Provider Note    Event Date/Time   First MD Initiated Contact with Patient 01/11/23 0144     (approximate)   History   Dysuria   HPI  Virginia Clements is a 89 y.o. female who presents to the ED for evaluation of Dysuria   I reviewed DC summary from 10 months ago, patient was admitted due to hypoxia in the setting of COVID and a UTI and metabolic encephalopathy.  She is wheelchair-bound at baseline, HTN, breast cancer s/p radiation.   Patient presents for evaluation of 1-2 days of shortness of breath.  She reports living at home with her sister without recent sick contacts.  No cough, fever, just dyspnea.  Of note, she is triaged as having dysuria with decreased output and a foul smell, but she denies all of this with me.  Noted to be hypoxic in triage requiring nasal cannula, which is new   Physical Exam   Triage Vital Signs: ED Triage Vitals  Encounter Vitals Group     BP 01/10/23 2139 (!) 143/86     Systolic BP Percentile --      Diastolic BP Percentile --      Pulse Rate 01/10/23 2139 71     Resp 01/10/23 2139 16     Temp 01/10/23 2139 98.4 F (36.9 C)     Temp Source 01/10/23 2139 Oral     SpO2 01/10/23 2126 (!) 82 %     Weight 01/10/23 2132 200 lb (90.7 kg)     Height 01/10/23 2132 5\' 5"  (1.651 m)     Head Circumference --      Peak Flow --      Pain Score 01/10/23 2132 0     Pain Loc --      Pain Education --      Exclude from Growth Chart --     Most recent vital signs: Vitals:   01/11/23 0245 01/11/23 0300  BP: (!) 159/88 (!) 163/70  Pulse: 75 71  Resp: (!) 23   Temp:    SpO2: 100% 99%    General: Awake, no distress.  Slumped over to the side and looking unwell, but awakens to vocal stimulation and seems to answer all questions appropriately, but keeping her eyes closed CV:  Good peripheral perfusion.  Resp:  Normal effort.  Abd:  No distention.  Soft MSK:  No deformity noted.  Lower extremity edema is  noted Neuro:  No focal deficits appreciated. Other:     ED Results / Procedures / Treatments   Labs (all labs ordered are listed, but only abnormal results are displayed) Labs Reviewed  URINALYSIS, ROUTINE W REFLEX MICROSCOPIC - Abnormal; Notable for the following components:      Result Value   Color, Urine YELLOW (*)    APPearance HAZY (*)    Specific Gravity, Urine 1.033 (*)    All other components within normal limits  CBC WITH DIFFERENTIAL/PLATELET - Abnormal; Notable for the following components:   Neutro Abs 7.8 (*)    All other components within normal limits  COMPREHENSIVE METABOLIC PANEL - Abnormal; Notable for the following components:   Sodium 127 (*)    Chloride 88 (*)    Glucose, Bld 118 (*)    AST 14 (*)    All other components within normal limits  SARS CORONAVIRUS 2 BY RT PCR  BRAIN NATRIURETIC PEPTIDE  TROPONIN I (HIGH SENSITIVITY)  TROPONIN I (HIGH SENSITIVITY)    EKG  Wandering baseline, sinus rhythm with a rate of 71 bpm.  Normal axis and intervals.  No STEMI.  RADIOLOGY CXR interpreted by me without evidence of acute cardiopulmonary pathology. CTA chest interpreted by me without PE  Official radiology report(s): CT Angio Chest PE W and/or Wo Contrast  Result Date: 01/11/2023 CLINICAL DATA:  Shortness of breath, new hypoxia EXAM: CT ANGIOGRAPHY CHEST WITH CONTRAST TECHNIQUE: Multidetector CT imaging of the chest was performed using the standard protocol during bolus administration of intravenous contrast. Multiplanar CT image reconstructions and MIPs were obtained to evaluate the vascular anatomy. RADIATION DOSE REDUCTION: This exam was performed according to the departmental dose-optimization program which includes automated exposure control, adjustment of the mA and/or kV according to patient size and/or use of iterative reconstruction technique. CONTRAST:  OMNIPAQUE IOHEXOL 350 MG/ML SOLN COMPARISON:  Chest radiograph 01/11/2023 FINDINGS:  Cardiovascular: Negative for acute pulmonary embolism. No pericardial effusion. Coronary artery and aortic atherosclerotic calcification. Dilated main pulmonary artery measuring 34 mm in diameter can be seen with pulmonary hypertension. Mediastinum/Nodes: Trachea and esophagus are unremarkable. No thoracic adenopathy. Lungs/Pleura: Paraseptal emphysema. Patchy bilateral airspace opacities and interlobular septal thickening. Mild diffuse bronchial wall thickening. Bibasilar atelectasis/scarring. No pleural effusion or pneumothorax. Upper Abdomen: No acute abnormality. Musculoskeletal: No acute fracture. Review of the MIP images confirms the above findings. IMPRESSION: 1. Negative for acute pulmonary embolism. 2. Patchy bilateral airspace opacities and interlobular septal thickening, favor pulmonary edema. 3. Dilated main pulmonary artery measuring 34 mm in diameter can be seen with pulmonary hypertension. Aortic Atherosclerosis (ICD10-I70.0) and Emphysema (ICD10-J43.9). Electronically Signed   By: Minerva Fester M.D.   On: 01/11/2023 02:51   DG Chest Portable 1 View  Result Date: 01/11/2023 CLINICAL DATA:  New hypoxia. decreased urinary output from SNF EXAM: PORTABLE CHEST 1 VIEW.  Patient is rotated. COMPARISON:  Chest x-ray 03/17/2022 FINDINGS: The heart and mediastinal contours are unchanged. Aortic calcification. Low lung volumes. No focal consolidation. Chronic coarsened interstitial markings with no overt pulmonary edema. No pleural effusion. No pneumothorax. No acute osseous abnormality. IMPRESSION: 1. No active disease. 2.  Aortic Atherosclerosis (ICD10-I70.0). Electronically Signed   By: Tish Frederickson M.D.   On: 01/11/2023 01:48    PROCEDURES and INTERVENTIONS:  .1-3 Lead EKG Interpretation  Performed by: Delton Prairie, MD Authorized by: Delton Prairie, MD     Interpretation: normal     ECG rate:  70   ECG rate assessment: normal     Rhythm: sinus rhythm     Ectopy: none     Conduction:  normal   .Critical Care  Performed by: Delton Prairie, MD Authorized by: Delton Prairie, MD   Critical care provider statement:    Critical care time (minutes):  30   Critical care time was exclusive of:  Separately billable procedures and treating other patients   Critical care was necessary to treat or prevent imminent or life-threatening deterioration of the following conditions:  Respiratory failure   Critical care was time spent personally by me on the following activities:  Development of treatment plan with patient or surrogate, discussions with consultants, evaluation of patient's response to treatment, examination of patient, ordering and review of laboratory studies, ordering and review of radiographic studies, ordering and performing treatments and interventions, pulse oximetry, re-evaluation of patient's condition and review of old charts   Medications  furosemide (LASIX) injection 40 mg (has no administration in time range)  iohexol (OMNIPAQUE) 350 MG/ML injection 100 mL (100 mLs Intravenous Contrast Given 01/11/23 0233)  IMPRESSION / MDM / ASSESSMENT AND PLAN / ED COURSE  I reviewed the triage vital signs and the nursing notes.  Differential diagnosis includes, but is not limited to, ACS, PTX, PNA, muscle strain/spasm, PE, dissection, anxiety, pleural effusion, UTI, sepsis  {Patient presents with symptoms of an acute illness or injury that is potentially life-threatening.  Patient presents from home with 2 days of progressive dyspnea, lower extremity swelling, hypoxia and evidence of CHF exacerbation requiring diuresis and medical admission.  Nonischemic EKG, negative troponin.  Metabolic panel with hyponatremia and she has a normal CBC.  UA without infectious features despite her chief complaint.  Initiated diuresis, she remains on nasal cannula, consult medicine for admission.  Fairly reassuring imaging      FINAL CLINICAL IMPRESSION(S) / ED DIAGNOSES   Final diagnoses:   Acute on chronic diastolic congestive heart failure (HCC)  Hypoxia  Shortness of breath  Hyponatremia     Rx / DC Orders   ED Discharge Orders     None        Note:  This document was prepared using Dragon voice recognition software and may include unintentional dictation errors.   Delton Prairie, MD 01/11/23 [id number]

## 2023-01-11 NOTE — Assessment & Plan Note (Signed)
2D echo 03/22/2022 with EF of 60 to 65% and grade 1 diastolic dysfunction Acute decompensation today with noted new onset hypoxia, pulmonary edema on imaging and 3+ pitting edema bilaterally IV Lasix Repeat 2D echo Strict ins and outs and daily weights Cardiology consulted Follow-up recommendations

## 2023-01-11 NOTE — TOC Initial Note (Signed)
Transition of Care [facility]) - Initial/Assessment Note    Patient Details  Name: Virginia Clements MRN: [id number] Date of Birth: April 19, 1929  Transition of Care [facility]) CM/SW Contact:    Marquita Palms, LCSW Phone Number: 01/11/2023, 8:01 AM  Clinical Narrative:                  CSW spoke with contact at home for patient. Patients sister Virginia Clements not able to hear on the phone and no one is present in patients room. CSW spoke with Virginia Clements's husband per her request and he was confused on the phone as well. CSW will be awaiting response from MD regarding TOC consult.        Patient Goals and CMS Choice            Expected Discharge Plan and Services                                              Prior Living Arrangements/Services                       Activities of Daily Living      Permission Sought/Granted                  Emotional Assessment              Admission diagnosis:  Acute CHF (congestive heart failure) (HCC) [I50.9] Patient Active Problem List   Diagnosis Date Noted   Acute CHF (congestive heart failure) (HCC) 01/11/2023   Paroxysmal atrial fibrillation (HCC) 03/20/2022   Acute hypoxemic respiratory failure due to COVID-19 [facility]) 03/17/2022   Depression 03/17/2022   Acute metabolic encephalopathy 03/17/2022   Hyponatremia 08/27/2020   Hyperkalemia 08/27/2020   Fecal impaction (HCC) 08/27/2020   AKI (acute kidney injury) (HCC) 06/18/2020   Vomiting 06/18/2020   Wheelchair dependent 06/18/2020   Benign essential HTN 06/18/2020   Wheelchair dependence 06/16/2020   Generalized weakness 06/16/2020   Sepsis secondary to UTI (HCC) 06/16/2020   UTI (urinary tract infection) 06/15/2020   Hypokalemia 05/31/2018   PCP:  Gracelyn Nurse, MD Pharmacy:   [facility] DELIVERY - [place], [place] - [address] [place] [address] Phone: [phone] Fax: [phone]  [id number]  [place], [place] - [address] [place] [address] Phone: [phone] Fax: [phone]     Social Determinants of Health (SDOH) Social History: SDOH Screenings   Food Insecurity: No Food Insecurity (05/31/2018)  Transportation Needs: No Transportation Needs (05/31/2018)  Financial Resource Strain: Low Risk  (05/31/2018)  Physical Activity: Unknown (05/31/2018)  Social Connections: Unknown (05/31/2018)  Stress: No Stress Concern Present (05/31/2018)  Tobacco Use: Medium Risk (01/10/2023)   SDOH Interventions:     Readmission Risk Interventions    06/16/2020    3:52 PM  Readmission Risk Prevention Plan  Post Dischage Appt Complete  Medication Screening Complete  Transportation Screening Complete

## 2023-01-11 NOTE — Assessment & Plan Note (Signed)
Per report, patient with decreased urinary output and foul-smelling urine Urinalysis minimally indicative of infection Noted overlapping encephalopathy Given overall symptomatology, will cover prophylactically with IV Rocephin Urine culture Monitor

## 2023-01-11 NOTE — Assessment & Plan Note (Signed)
BP stable Titrate home regimen 

## 2023-01-11 NOTE — ED Notes (Signed)
Caregiver at bedside. Caregiver feeding pt breakfast at this time.

## 2023-01-11 NOTE — Assessment & Plan Note (Signed)
Decompensated respiratory failure now requiring 4 L nasal cannula with concern for volume overload in the setting of acute on chronic HFpEF CTA negative PE, however showing pulmonary edema as well as pulmonary hypertension BNP within normal limits No overt signs of infectious process at present 2-3+ pitting edema bilaterally IV Lasix Strict ins and outs and daily weights Monitor respiratory status with diuresis Cardiology consulted

## 2023-01-11 NOTE — Consult Note (Signed)
Cardiology Consultation:   Patient ID: Virginia Clements; [id number]; [birth year]   Admit date: 01/11/2023 Date of Consult: 01/11/2023  Primary Care Provider: Gracelyn Nurse, MD Primary Cardiologist: New - consult by Kirke Corin Primary Electrophysiologist:  None   Patient Profile:   Virginia Clements is a 89 y.o. female with a hx of HFpEF, PAF, diagnosed in 03/2022 in the setting of COVID not on OAC, diastolic dysfunction, emphysema noted on CT imaging, breast cancer status post radiation, HTN, depression, and obesity who is wheelchair-bound and presented with decreased urine output and was found to be hypoxic upon EMS arrival and is being seen today for the evaluation of volume overload at the request of Dr. Alvester Morin.  History of Present Illness:   Virginia Clements was found to have new onset A-fib in 03/2022 during admission for acute hypoxic respiratory failure in the setting of COVID.  Echo at that time showed an EF of 60 to 65%, no regional wall motion abnormalities, moderate concentric LVH, grade 1 diastolic dysfunction, normal RV systolic function and ventricular cavity size, trivial mitral regurgitation, and an estimated right atrial pressure of 3 mmHg.  She was not evaluated by cardiology and was not placed on anticoagulation, as the risk was felt to outweigh benefit.    She presented to [facility] ED late in the evening on 01/10/2023 after EMS brought her in with complaints of decreased urine output and malodorous urine.  Upon EMS arrival she was found to be satting at 82% on room air and was placed on supplemental oxygen via nasal cannula.  Further history is taken from prior documentation as patient is currently encephalopathic and there is no family present.  Upon arrival to [facility] she was hemodynamically stable and remained on supplemental oxygen via nasal cannula.  COVID, influenza, and RSV negative.  High-sensitivity troponin negative x 2.  BNP 93.  CBC unremarkable.  Sodium 127,  potassium 3.6, BUN 12, serum creatinine 0.49.  Urinalysis negative for nitrite, leukocytes, or Hgb.  Chest x-ray showed no active disease with aortic atherosclerosis.  CTA chest showed no evidence of PE with patchy bilateral airspace opacities and interlobar septal thickening favored to represent pulmonary edema, as well as a dilated main pulmonary artery along with aortic atherosclerosis and emphysema.  In the ED she received IV Lasix 40 mg.  She remains somnolent and encephalopathic at time of cardiology consult.    Past Medical History:  Diagnosis Date   Breast cancer (HCC)    Hypertension    Hypoglycemia    PAF (paroxysmal atrial fibrillation) (HCC)    Radiation 2009    Past Surgical History:  Procedure Laterality Date   BREAST BIOPSY Right 2009     Home Meds: Prior to Admission medications   Medication Sig Start Date End Date Taking? Authorizing Provider  albuterol (VENTOLIN HFA) 108 (90 Base) MCG/ACT inhaler Inhale 2 puffs into the lungs every 4 (four) hours as needed for wheezing or shortness of breath. 03/20/22  Yes Sunnie Nielsen, DO  amLODipine (NORVASC) 10 MG tablet Take 1 tablet (10 mg total) by mouth daily. 06/05/18  Yes Altamese Dilling, MD  busPIRone (BUSPAR) 10 MG tablet Take 10 mg by mouth 2 (two) times daily.   Yes [provider]  enalapril (VASOTEC) 10 MG tablet Take 10 mg by mouth daily.   Yes [provider]  fexofenadine (ALLEGRA) 180 MG tablet Take 180 mg by mouth daily. 12/19/22 12/19/23 Yes [provider]  Infant Care Products [facility]) OINT Apply 1  Application topically daily. 01/12/22  Yes [provider]  metoprolol tartrate (LOPRESSOR) 25 MG tablet Take 0.5 tablets (12.5 mg total) by mouth 2 (two) times daily. Patient taking differently: Take 25 mg by mouth 2 (two) times daily. 03/20/22  Yes Sunnie Nielsen, DO  mirtazapine (REMERON) 7.5 MG tablet Take 7.5 mg by mouth at bedtime.   Yes [provider]   naproxen (NAPROSYN) 375 MG tablet Take 1 tablet (375 mg total) by mouth 2 (two) times daily as needed for mild pain or moderate pain. Patient taking differently: Take 375 mg by mouth 2 (two) times daily. 08/29/20  Yes Lewie Chamber, MD  timolol (TIMOPTIC-XR) 0.5 % ophthalmic gel-forming Place 1 drop into both eyes at bedtime. 07/06/14  Yes [provider]  acetaminophen (TYLENOL) 325 MG tablet Take 2 tablets (650 mg total) by mouth every 6 (six) hours as needed for mild pain (or Fever >/= 101). Patient not taking: Reported on 03/17/2022 06/04/18   Altamese Dilling, MD  famotidine (PEPCID) 20 MG tablet Take 1 tablet (20 mg total) by mouth 2 (two) times daily. Patient not taking: Reported on 01/11/2023 03/20/22   Sunnie Nielsen, DO  guaiFENesin-dextromethorphan (ROBITUSSIN DM) 100-10 MG/5ML syrup Take 10 mLs by mouth every 4 (four) hours as needed for cough. Patient not taking: Reported on 01/11/2023 03/20/22   Sunnie Nielsen, DO  HYDROcodone bit-homatropine (HYCODAN) 5-1.5 MG/5ML syrup Take 5 mLs by mouth every 6 (six) hours as needed. Patient not taking: Reported on 01/11/2023 03/14/22   [provider]  ipratropium (ATROVENT HFA) 17 MCG/ACT inhaler Inhale 2 puffs into the lungs every 4 (four) hours. Patient not taking: Reported on 01/11/2023 03/20/22   Sunnie Nielsen, DO  nirmatrelvir/ritonavir EUA, renal dosing, (PAXLOVID) 10 x 150 MG & 10 x 100MG  TABS Take 2 tablets by mouth 2 (two) times daily for 3 days. GFR >60. Take nirmatrelvir (150 mg) one tablet twice daily for 3 days and ritonavir (100 mg) one tablet twice daily for 3 days. 03/20/22 03/23/22  Sunnie Nielsen, DO  polyethylene glycol (MIRALAX / GLYCOLAX) 17 g packet Take 17 g by mouth daily as needed for mild constipation or moderate constipation. Patient not taking: Reported on 01/11/2023 08/29/20   Lewie Chamber, MD  zinc sulfate 220 (50 Zn) MG capsule Take 1 capsule (220 mg total) by mouth  daily. Patient not taking: Reported on 01/11/2023 03/20/22   Sunnie Nielsen, DO    Inpatient Medications: Scheduled Meds:  amLODipine  10 mg Oral Daily   enalapril  10 mg Oral Daily   enoxaparin (LOVENOX) injection  0.5 mg/kg Subcutaneous Q24H   furosemide  20 mg Intravenous BID   metoprolol tartrate  12.5 mg Oral BID   Continuous Infusions:  cefTRIAXone (ROCEPHIN)  IV     PRN Meds: acetaminophen **OR** acetaminophen, albuterol, ondansetron **OR** ondansetron (ZOFRAN) IV  Allergies:   Allergies  Allergen Reactions   Alendronate Other (See Comments)    Nervousness and shakiness per pt   Codeine     dizziness    Social History:   Social History   Socioeconomic History   Marital status: Widowed    Spouse name: Not on file   Number of children: 0   Years of education: Not on file   Highest education level: 8th grade  Occupational History   Occupation: retired   Tobacco Use   Smoking status: Former   Smokeless tobacco: Never  Advertising account planner   Vaping status: Never Used  Substance and Sexual Activity  Alcohol use: Not Currently   Drug use: Never   Sexual activity: Not Currently  Other Topics Concern   Not on file  Social History Narrative   Not on file   Social Determinants of Health   Financial Resource Strain: Low Risk  (05/31/2018)   Overall Financial Resource Strain (CARDIA)    Difficulty of Paying Living Expenses: Not hard at all  Food Insecurity: No Food Insecurity (05/31/2018)   Hunger Vital Sign    Worried About Running Out of Food in the Last Year: Never true    Ran Out of Food in the Last Year: Never true  Transportation Needs: No Transportation Needs (05/31/2018)   PRAPARE - Administrator, Civil Service (Medical): No    Lack of Transportation (Non-Medical): No  Physical Activity: Unknown (05/31/2018)   Exercise Vital Sign    Days of Exercise per Week: Patient declined    Minutes of Exercise per Session: Patient declined  Stress: No  Stress Concern Present (05/31/2018)   [employer] of Occupational Health - Occupational Stress Questionnaire    Feeling of Stress : Not at all  Social Connections: Unknown (05/31/2018)   Social Connection and Isolation Panel [NHANES]    Frequency of Communication with Friends and Family: Patient declined    Frequency of Social Gatherings with Friends and Family: Patient declined    Attends Religious Services: Patient declined    Active Member of Clubs or Organizations: Patient declined    Attends Banker Meetings: Patient declined    Marital Status: Patient declined  Intimate Partner Violence: Unknown (05/31/2018)   Humiliation, Afraid, Rape, and Kick questionnaire    Fear of Current or Ex-Partner: Patient declined    Emotionally Abused: Patient declined    Physically Abused: Patient declined    Sexually Abused: Patient declined     Family History:   Family History  Problem Relation Age of Onset   Hypertension Mother     ROS:  Review of Systems  Unable to perform ROS: Mental status change      Physical Exam/Data:   Vitals:   01/11/23 0500 01/11/23 0700 01/11/23 0800 01/11/23 0900  BP: (!) 155/71 (!) 164/60 (!) 143/65 (!) 169/78  Pulse: 69 72 86 66  Resp: 17     Temp:      TempSrc:      SpO2: 98% 99% 97% 98%  Weight:      Height:        Intake/Output Summary (Last 24 hours) at 01/11/2023 1001 Last data filed at 01/11/2023 0500 Gross per 24 hour  Intake --  Output 750 ml  Net -750 ml   Filed Weights   01/10/23 2132  Weight: 90.7 kg   Body mass index is 33.28 kg/m.   Physical Exam: General: Well developed, well nourished, in no acute distress. Head: Normocephalic, atraumatic, sclera non-icteric, no xanthomas, nares without discharge.  Neck: Negative for carotid bruits. JVD difficult to assess secondary to body habitus. Lungs: Coarse and diminished breath sounds bilaterally. Breathing is unlabored. Heart: RRR with S1 S2. No murmurs, rubs, or  gallops appreciated. Abdomen: Soft, non-tender, non-distended with normoactive bowel sounds. No hepatomegaly. No rebound/guarding. No obvious abdominal masses. Msk:  Strength and tone appear normal for age. Extremities: No clubbing or cyanosis.  1+ bilateral lower extremity edema. Distal pedal pulses are 2+ and equal bilaterally. Neuro: Somnolent, opens eyes when requested.  Psych: Somnolent.   EKG:  The EKG was personally reviewed and demonstrates: NSR, 71 bpm,  LVH with early repolarization abnormality, poor R wave progression, and no acute ST-T changes Telemetry:  Telemetry was personally reviewed and demonstrates: SR, 70s bpm  Weights: Filed Weights   01/10/23 2132  Weight: 90.7 kg    Relevant CV Studies:  2D echo 03/21/2022: 1. Left ventricular ejection fraction, by estimation, is 60 to 65%. The  left ventricle has normal function. The left ventricle has no regional  wall motion abnormalities. There is moderate concentric left ventricular  hypertrophy. Left ventricular  diastolic parameters are consistent with Grade I diastolic dysfunction  (impaired relaxation).   2. Right ventricular systolic function is normal. The right ventricular  size is normal.   3. The mitral valve is normal in structure. Trivial mitral valve  regurgitation. No evidence of mitral stenosis.   4. The aortic valve is calcified. Aortic valve regurgitation is not  visualized. No aortic stenosis is present.   5. The inferior vena cava is normal in size with greater than 50%  respiratory variability, suggesting right atrial pressure of 3 mmHg.  __________  2D echo 06/16/2020: 1. Left ventricular ejection fraction, by estimation, is 60 to 65%. The  left ventricle has normal function. The left ventricle has no regional  wall motion abnormalities. There is mild left ventricular hypertrophy.  Left ventricular diastolic function  could not be evaluated.   2. Right ventricular systolic function is normal. The  right ventricular  size is normal.   3. The mitral valve is grossly normal. No evidence of mitral valve  regurgitation.   4. The aortic valve is tricuspid. Aortic valve regurgitation is not  visualized. Mild aortic valve sclerosis is present, with no evidence of  aortic valve stenosis.   Laboratory Data:  Chemistry Recent Labs  Lab 01/10/23 2324 01/11/23 0501  NA 127*  --   K 3.6  --   CL 88*  --   CO2 30  --   GLUCOSE 118*  --   BUN 12  --   CREATININE 0.49 0.52  CALCIUM 9.7  --   GFRNONAA >60 >60  ANIONGAP 9  --     Recent Labs  Lab 01/10/23 2324  PROT 6.8  ALBUMIN 3.8  AST 14*  ALT 13  ALKPHOS 76  BILITOT 0.8   Hematology Recent Labs  Lab 01/10/23 2324 01/11/23 0501  WBC 10.1 10.5  RBC 4.85 4.75  HGB 15.0 14.7  HCT 45.4 43.5  MCV 93.6 91.6  MCH 30.9 30.9  MCHC 33.0 33.8  RDW 13.0 12.5  PLT 268 285   Cardiac EnzymesNo results for input(s): "TROPONINI" in the last 168 hours. No results for input(s): "TROPIPOC" in the last 168 hours.  BNP Recent Labs  Lab 01/10/23 2324  BNP 93.1    DDimer No results for input(s): "DDIMER" in the last 168 hours.  Radiology/Studies:  CT Angio Chest PE W and/or Wo Contrast  Result Date: 01/11/2023 IMPRESSION: 1. Negative for acute pulmonary embolism. 2. Patchy bilateral airspace opacities and interlobular septal thickening, favor pulmonary edema. 3. Dilated main pulmonary artery measuring 34 mm in diameter can be seen with pulmonary hypertension. Aortic Atherosclerosis (ICD10-I70.0) and Emphysema (ICD10-J43.9). Electronically Signed   By: Minerva Fester M.D.   On: 01/11/2023 02:51   DG Chest Portable 1 View  Result Date: 01/11/2023 IMPRESSION: 1. No active disease. 2.  Aortic Atherosclerosis (ICD10-I70.0). Electronically Signed   By: Tish Frederickson M.D.   On: 01/11/2023 01:48    Assessment and Plan:   1.  Acute hypoxic  respiratory failure with acute on chronic HFpEF: -Volume overloaded -Has been placed on IV  Lasix 20 mg twice daily, furosemide nave -Monitor urine output with goal diuresis of 1 to 2 L per 24 hours, may need to escalate to furosemide -Would defer addition of SGLT2 inhibitor in the setting of advanced age and concern for off target effects  2.  Acute metabolic encephalopathy: -Suspected to be multifactorial in the setting of acute hypoxic respiratory failure, possible undiagnosed sleep apnea -Cannot exclude hypercarbia, internal medicine has ordered VBG -Ammonia pending -May benefit from neuroimaging, will defer to internal medicine -Ongoing management per internal medicine  3.  Lone A-fib: -Documented episode during COVID illness in 03/2022, no further documented episodes available for review in [place] [facility] -Has not been maintained on OAC as risk is felt to outweigh benefits -Maintaining sinus rhythm currently -CHA2DS2-VASc at least 6 (CHF, HTN, age x 2, vascular disease, sex category) -Baseline currently unclear -Defer addition of heparin drip at this time with recommendation to revisit [facility] prior to discharge pending mental status  4.  Remaining comorbidities per internal medicine       For questions or updates, please contact [facility] Please consult [email or web address] for contact info under Cardiology/STEMI.   Signed, Eula Listen, PA-C [facility] Pager: [phone] 01/11/2023, 10:01 AM

## 2023-01-11 NOTE — Progress Notes (Signed)
Anticoagulation monitoring(Lovenox):  89 yo female ordered Lovenox 40 mg Q24h    Filed Weights   01/10/23 2132  Weight: 90.7 kg (200 lb)   BMI 33   Lab Results  Component Value Date   CREATININE 0.49 01/10/2023   CREATININE 0.59 03/20/2022   CREATININE 0.61 03/18/2022   Estimated Creatinine Clearance: 52 mL/min (by C-G formula based on SCr of 0.49 mg/dL). Hemoglobin & Hematocrit     Component Value Date/Time   HGB 15.0 01/10/2023 2324   HGB 9.7 (L) 08/15/2011 0928   HCT 45.4 01/10/2023 2324   HCT 28.7 (L) 08/15/2011 1610     Per Protocol for Patient with estCrcl > 30 ml/min and BMI > 30, will transition to Lovenox 45 mg Q24h.

## 2023-01-11 NOTE — ED Notes (Signed)
MD made aware critical pCO2 93

## 2023-01-12 ENCOUNTER — Inpatient Hospital Stay (HOSPITAL_COMMUNITY)
Admit: 2023-01-12 | Discharge: 2023-01-12 | Disposition: A | Discharge status: Home / Self Care | Payer: Medicare Other | Attending: Internal Medicine | Admitting: Internal Medicine

## 2023-01-12 DIAGNOSIS — I5031 Acute diastolic (congestive) heart failure: Secondary | ICD-10-CM | POA: Diagnosis not present

## 2023-01-12 DIAGNOSIS — I5033 Acute on chronic diastolic (congestive) heart failure: Secondary | ICD-10-CM | POA: Diagnosis not present

## 2023-01-12 DIAGNOSIS — J9602 Acute respiratory failure with hypercapnia: Secondary | ICD-10-CM

## 2023-01-12 DIAGNOSIS — J9601 Acute respiratory failure with hypoxia: Secondary | ICD-10-CM | POA: Diagnosis not present

## 2023-01-12 LAB — ECHOCARDIOGRAM COMPLETE
AR max vel: 2.1 cm2
AV Area VTI: 2.35 cm2
AV Area mean vel: 1.99 cm2
AV Mean grad: 4 mm[Hg]
AV Peak grad: 9.9 mm[Hg]
Ao pk vel: 1.57 m/s
Area-P 1/2: 4.6 cm2
MV VTI: 2.84 cm2
S' Lateral: 2.9 cm
Weight: 3298.08 [oz_av]

## 2023-01-12 LAB — BLOOD GAS, VENOUS
Acid-Base Excess: 20.7 mmol/L — ABNORMAL HIGH (ref 0.0–2.0)
Bicarbonate: 47.6 mmol/L — ABNORMAL HIGH (ref 20.0–28.0)
O2 Saturation: 64.3 %
Patient temperature: 37
pCO2, Ven: 61 mm[Hg] — ABNORMAL HIGH (ref 44–60)
pH, Ven: 7.5 — ABNORMAL HIGH (ref 7.25–7.43)
pO2, Ven: 33 mm[Hg] (ref 32–45)

## 2023-01-12 LAB — COMPREHENSIVE METABOLIC PANEL
ALT: 12 U/L (ref 0–44)
AST: 14 U/L — ABNORMAL LOW (ref 15–41)
Albumin: 3.2 g/dL — ABNORMAL LOW (ref 3.5–5.0)
Alkaline Phosphatase: 62 U/L (ref 38–126)
Anion gap: 11 (ref 5–15)
BUN: 11 mg/dL (ref 8–23)
CO2: 35 mmol/L — ABNORMAL HIGH (ref 22–32)
Calcium: 9.8 mg/dL (ref 8.9–10.3)
Chloride: 88 mmol/L — ABNORMAL LOW (ref 98–111)
Creatinine, Ser: 0.53 mg/dL (ref 0.44–1.00)
GFR, Estimated: >60 mL/min (ref >60)
Glucose, Bld: 87 mg/dL (ref 70–99)
Potassium: 3.4 mmol/L — ABNORMAL LOW (ref 3.5–5.1)
Sodium: 133 mmol/L — ABNORMAL LOW (ref 135–145)
Total Bilirubin: 0.9 mg/dL (ref 0.3–1.2)
Total Protein: 6 g/dL — ABNORMAL LOW (ref 6.5–8.1)

## 2023-01-12 LAB — CBC
HCT: 42 % (ref 36.0–46.0)
Hemoglobin: 14.2 g/dL (ref 12.0–15.0)
MCH: 31.9 pg (ref 26.0–34.0)
MCHC: 33.8 g/dL (ref 30.0–36.0)
MCV: 94.4 fL (ref 80.0–100.0)
Platelets: 251 10*3/uL (ref 150–400)
RBC: 4.45 MIL/uL (ref 3.87–5.11)
RDW: 13.2 % (ref 11.5–15.5)
WBC: 11.2 10*3/uL — ABNORMAL HIGH (ref 4.0–10.5)
nRBC: 0 % (ref 0.0–0.2)

## 2023-01-12 MED ORDER — FUROSEMIDE 10 MG/ML IJ SOLN
20.0000 mg | Freq: Every day | INTRAMUSCULAR | Status: DC
  Administered 2023-01-13: 20 mg via INTRAVENOUS
  Filled 2023-01-12: qty 2

## 2023-01-12 MED ORDER — NYSTATIN 100000 UNIT/GM EX POWD
Freq: Three times a day (TID) | CUTANEOUS | Status: DC
  Filled 2023-01-12: qty 15

## 2023-01-12 MED ORDER — POTASSIUM CHLORIDE CRYS ER 20 MEQ PO TBCR
40.0000 meq | EXTENDED_RELEASE_TABLET | Freq: Two times a day (BID) | ORAL | Status: DC
  Administered 2023-01-12 – 2023-01-13 (×3): 40 meq via ORAL
  Filled 2023-01-12 (×3): qty 2

## 2023-01-12 MED ORDER — ORAL CARE MOUTH RINSE
15.0000 mL | OROMUCOSAL | Status: DC
  Administered 2023-01-12 – 2023-01-15 (×11): 15 mL via OROMUCOSAL

## 2023-01-12 MED ORDER — SODIUM CHLORIDE 0.9 % IV SOLN: INTRAVENOUS | Status: AC

## 2023-01-12 MED ORDER — POTASSIUM CHLORIDE CRYS ER 20 MEQ PO TBCR
40.0000 meq | EXTENDED_RELEASE_TABLET | Freq: Once | ORAL | Status: AC
  Administered 2023-01-12: 40 meq via ORAL
  Filled 2023-01-12: qty 2

## 2023-01-12 NOTE — Progress Notes (Signed)
Progress Note   Patient: Virginia Clements GEX:[id number] DOB: 1929-12-04 DOA: 01/11/2023     1 DOS: the patient was seen and examined on 01/12/2023   Brief hospital course:  89 y.o. female with medical history significant of wheelchair-bound, HFpEF, episode of atrial fibrillation, hypertension, breast cancer status post radiation therapy, depression presenting with encephalopathy, acute on chronic HFpEF,?  UTI.  Limited history in the setting of encephalopathy.  Per report, patient with decreased urine output at home as well as odorous urine.  EMS evaluated the patient at home with O2 being placed to 4 L to keep O2 sats above 80% on room air.  No reports of fevers or chills.  No reports of nausea vomiting, chest pain or shortness of breath.  Noted admission December 2023 with episode of paroxysmal A-fib with self resolved after IV metoprolol x 1 as well as diagnosis of grade 1 diastolic dysfunction on 2D echo during that admission.  Unclear if patient has had any cardiology follow-up from this point. Presented to the ER afebrile, hemodynamically stable.  Satting in the low 80s on room air.  Transition to 4 L nasal cannula to keep O2 sats greater than 97%.  White count 10.1, hemoglobin 15, platelets 268, troponin negative x 1, urinalysis minimally to indicative of infection, COVID flu and RSV negative, creatinine 0.5.  BNP within normal limits.  CT of the chest negative for PE but does show changes consistent with pulmonary edema as well as pulmonary hypertension.  10/11 : Patient improvement in pCO2 however VBG showing metabolic alkalosis with retention of bicarb.  Likely secondary to overdiuresis.  Lowered the dose of Lasix 20 mg daily.   Gave 500 cc IV fluids.  Will continue to monitor the patient with serial BMPs.  Replete electrolytes including potassium and sodium.  Assessment and Plan: Benign essential HTN BP stable Titrate home regimen  UTI (urinary tract infection) Per report,  patient with decreased urinary output and foul-smelling urine Urinalysis minimally indicative of infection Noted overlapping encephalopathy Given overall symptomatology, will cover prophylactically with IV Rocephin Urine culture  Encephalopathy -improved today patient perky likely at her baseline. Noted hypersomnolence on presentation-patient is snoring at the bedside Nonfocal neuroexam Suspect multifactorial in the setting of hypoxic respiratory failure, acute on chronic HFpEF ?  UTI with some component of general fatigue Will check VBG to rule out hypercarbia-suspect undiagnosed OHS  Ammonia level x 1 IV Rocephin for UTI coverage CT head as appropriate Otherwise monitor  Acute on chronic diastolic congestive heart failure  2D echo 03/22/2022 with EF of 60 to 65% and grade 1 diastolic dysfunction Acute decompensation today with noted new onset hypoxia, pulmonary edema on imaging and 3+ pitting edema bilaterally IV Lasix Repeat 2D echo Strict ins and outs and daily weights Cardiology consulted Follow-up recommendations  Acute respiratory failure with hypoxia  Decompensated respiratory failure now requiring 4 L nasal cannula with concern for volume overload in the setting of acute on chronic HFpEF CTA negative PE, however showing pulmonary edema as well as pulmonary hypertension BNP within normal limits No overt signs of infectious process at present 2-3+ pitting edema bilaterally IV Lasix Strict ins and outs and daily weights Monitor respiratory status with diuresis Cardiology consulted  Paroxysmal atrial fibrillation  Remote history of atrial fibrillation during hospitalization 03/2022-converted with IV metoprolol EKG normal sinus rhythm today Not felt to be a anticoagulation candidate during that admission Cardiology consulted Monitor     Subjective: Patient symptomatically feeling better unclear why she is  in the hospital.  As per nursing patient likely back to her  baseline.  No overnight events.  Physical Exam: Vitals:   01/12/23 0842 01/12/23 1152 01/12/23 1208 01/12/23 1228  BP: 127/60  135/74   Pulse: 85  79   Resp: 16 20 16 18   Temp: 98.5 F (36.9 C)  98.6 F (37 C)   TempSrc: Oral  Oral   SpO2: 97%  94%   Weight:      Height:       Physical Exam Constitutional:      Appearance: She is obese.  HENT:     Head: Normocephalic and atraumatic.     Mouth/Throat:     Mouth: Mucous membranes are dry.  Eyes:     Extraocular Movements: Extraocular movements intact.     Pupils: Pupils are equal, round, and reactive to light.  Cardiovascular:     Rate and Rhythm: Normal rate.  Pulmonary:     Breath sounds: No stridor. No wheezing, rhonchi or rales.  Musculoskeletal:        General: Normal range of motion.     Cervical back: Normal range of motion.  Neurological:     Mental Status: She is alert. She is disoriented.     Data Reviewed:  Results are pending, will review when available.  Family Communication: None by bedside  Disposition: Status is: Inpatient Remains inpatient appropriate because: Hypercapnic respiratory failure with change in mental status  Planned Discharge Destination: Home    Time spent: 35 minutes  Author: Kirstie Peri, MD 01/12/2023 2:53 PM  For on call review [email or web address].

## 2023-01-12 NOTE — TOC Progression Note (Signed)
Transition of Care [facility]) - Progression Note    Patient Details  Name: Virginia Clements MRN: [id number] Date of Birth: 08/23/1929  Transition of [facility]) CM/SW Contact  Truddie Hidden, RN Phone Number: 01/12/2023, 12:00 PM  Clinical Narrative:    TOC continuing to follow patient's progress throughout discharge planning.        Expected Discharge Plan and Services                                               Social Determinants of Health (SDOH) Interventions SDOH Screenings   Food Insecurity: No Food Insecurity (05/31/2018)  Transportation Needs: No Transportation Needs (05/31/2018)  Financial Resource Strain: Low Risk  (05/31/2018)  Physical Activity: Unknown (05/31/2018)  Social Connections: Unknown (05/31/2018)  Stress: No Stress Concern Present (05/31/2018)  Tobacco Use: Medium Risk (01/10/2023)    Readmission Risk Interventions    06/16/2020    3:52 PM  Readmission Risk Prevention Plan  Post Dischage Appt Complete  Medication Screening Complete  Transportation Screening Complete

## 2023-01-12 NOTE — Progress Notes (Signed)
Progress Note  Patient Name: Virginia Clements Date of Encounter: 01/12/2023  Primary Cardiologist: New - consult by Kirke Corin   Subjective   Remains on BiPAP, denies chest pain.  Urine output 850 mL for the past 24 hours, net -1.6 L for the admission.  Sodium improving from 1 27-1 33.  Potassium 3.4.  Renal function stable.  pCO2 on VBG improving with BiPAP.  Inpatient Medications    Scheduled Meds:  amLODipine  10 mg Oral Daily   enalapril  10 mg Oral Daily   enoxaparin (LOVENOX) injection  0.5 mg/kg Subcutaneous Q24H   furosemide  20 mg Intravenous BID   metoprolol tartrate  12.5 mg Oral BID   mouth rinse  15 mL Mouth Rinse 4 times per day   Continuous Infusions:  cefTRIAXone (ROCEPHIN)  IV Stopped (01/11/23 1049)   PRN Meds: acetaminophen **OR** acetaminophen, albuterol, ondansetron **OR** ondansetron (ZOFRAN) IV   Vital Signs    Vitals:   01/11/23 2100 01/12/23 0042 01/12/23 0412 01/12/23 0500  BP: 119/66 123/64 118/72   Pulse: 80 85 82   Resp:  16 17   Temp: 98.6 F (37 C) 97.8 F (36.6 C) 97.9 F (36.6 C)   TempSrc: Axillary Oral Axillary   SpO2: 95%     Weight:    93.5 kg  Height:        Intake/Output Summary (Last 24 hours) at 01/12/2023 2952 Last data filed at 01/11/2023 1019 Gross per 24 hour  Intake --  Output 850 ml  Net -850 ml   Filed Weights   01/10/23 2132 01/12/23 0500  Weight: 90.7 kg 93.5 kg    Telemetry    Sinus rhythm- Personally Reviewed  ECG    No new tracings - Personally Reviewed  Physical Exam   GEN: No acute distress.   Neck: JVD difficult to assess secondary to body habitus and respiratory support apparatus. Cardiac: RRR, no murmurs, rubs, or gallops.  Respiratory: Diminished and coarse breath sounds to anterior auscultation bilaterally.  GI: Soft, nontender, non-distended.   MS: Mild bilateral lower extremity edema with TED hose in place; No deformity. Neuro:  Alert and oriented x 3; Nonfocal.  Psych: Normal  affect.  Labs    Chemistry Recent Labs  Lab 01/10/23 2324 01/11/23 0501 01/12/23 0602  NA 127*  --  133*  K 3.6  --  3.4*  CL 88*  --  88*  CO2 30  --  35*  GLUCOSE 118*  --  87  BUN 12  --  11  CREATININE 0.49 0.52 0.53  CALCIUM 9.7  --  9.8  PROT 6.8  --  6.0*  ALBUMIN 3.8  --  3.2*  AST 14*  --  14*  ALT 13  --  12  ALKPHOS 76  --  62  BILITOT 0.8  --  0.9  GFRNONAA >60 >60 >60  ANIONGAP 9  --  11     Hematology Recent Labs  Lab 01/10/23 2324 01/11/23 0501 01/12/23 0602  WBC 10.1 10.5 11.2*  RBC 4.85 4.75 4.45  HGB 15.0 14.7 14.2  HCT 45.4 43.5 42.0  MCV 93.6 91.6 94.4  MCH 30.9 30.9 31.9  MCHC 33.0 33.8 33.8  RDW 13.0 12.5 13.2  PLT 268 285 251    Cardiac EnzymesNo results for input(s): "TROPONINI" in the last 168 hours. No results for input(s): "TROPIPOC" in the last 168 hours.   BNP Recent Labs  Lab 01/10/23 2324  BNP 93.1  DDimer No results for input(s): "DDIMER" in the last 168 hours.   Radiology    CT Angio Chest PE W and/or Wo Contrast  Result Date: 01/11/2023 IMPRESSION: 1. Negative for acute pulmonary embolism. 2. Patchy bilateral airspace opacities and interlobular septal thickening, favor pulmonary edema. 3. Dilated main pulmonary artery measuring 34 mm in diameter can be seen with pulmonary hypertension. Aortic Atherosclerosis (ICD10-I70.0) and Emphysema (ICD10-J43.9). Electronically Signed   By: Minerva Fester M.D.   On: 01/11/2023 02:51   DG Chest Portable 1 View  Result Date: 01/11/2023 IMPRESSION: 1. No active disease. 2.  Aortic Atherosclerosis (ICD10-I70.0). Electronically Signed   By: Tish Frederickson M.D.   On: 01/11/2023 01:48    Cardiac Studies   2D echo 03/21/2022: 1. Left ventricular ejection fraction, by estimation, is 60 to 65%. The  left ventricle has normal function. The left ventricle has no regional  wall motion abnormalities. There is moderate concentric left ventricular  hypertrophy. Left ventricular   diastolic parameters are consistent with Grade I diastolic dysfunction  (impaired relaxation).   2. Right ventricular systolic function is normal. The right ventricular  size is normal.   3. The mitral valve is normal in structure. Trivial mitral valve  regurgitation. No evidence of mitral stenosis.   4. The aortic valve is calcified. Aortic valve regurgitation is not  visualized. No aortic stenosis is present.   5. The inferior vena cava is normal in size with greater than 50%  respiratory variability, suggesting right atrial pressure of 3 mmHg.  __________   2D echo 06/16/2020: 1. Left ventricular ejection fraction, by estimation, is 60 to 65%. The  left ventricle has normal function. The left ventricle has no regional  wall motion abnormalities. There is mild left ventricular hypertrophy.  Left ventricular diastolic function  could not be evaluated.   2. Right ventricular systolic function is normal. The right ventricular  size is normal.   3. The mitral valve is grossly normal. No evidence of mitral valve  regurgitation.   4. The aortic valve is tricuspid. Aortic valve regurgitation is not  visualized. Mild aortic valve sclerosis is present, with no evidence of  aortic valve stenosis.   Patient Profile     89 y.o. female with history of HFpEF, PAF, diagnosed in 03/2022 in the setting of COVID not on OAC, diastolic dysfunction, emphysema noted on CT imaging, breast cancer status post radiation, HTN, depression, and obesity who is wheelchair-bound and presented with decreased urine output and was found to be hypoxic upon EMS arrival and is being seen today for the evaluation of volume overload at the request of Dr. Alvester Morin.   Assessment & Plan    1. Acute hypoxic and hypercarbic respiratory failure: -Likely multifactorial including HFpEF, emphysema, and untreated OSA and likely OHS -Remains on BiPAP with management per internal medicine -Continue IV Lasix 20 mg twice  daily -Suspect this is more of a pulmonary driven process  2.  Acute on chronic HFpEF: -Remains mildly volume up -Continue IV Lasix as outlined above -Monitor urine output with goal diuresis of 1 to 2 L per 24 hours -Would defer addition of SGLT2 inhibitor in the setting of advanced age and concern for off target effects   3.  Acute metabolic encephalopathy: -Suspected to be multifactorial in the setting of acute hypoxic and hypercarbic respiratory failure, possible undiagnosed sleep apnea with OHS -May benefit from neuroimaging, will defer to internal medicine -Ongoing management per internal medicine   4.  Lone A-fib: -Documented  episode during COVID illness in 03/2022, no further documented episodes available for review in [facility] -Has not been maintained on OAC as risk is felt to outweigh benefits -Maintaining sinus rhythm currently -CHA2DS2-VASc at least 6 (CHF, HTN, age x 2, vascular disease, sex category) -Baseline currently unclear -Defer addition of heparin drip at this time with recommendation to revisit [facility] prior to discharge pending mental status, though unlikely to be a good candidate   5.  Remaining comorbidities per internal medicine       For questions or updates, please contact [facility] Please consult [email or web address] for contact info under Cardiology/STEMI.    Signed, Eula Listen, PA-C [facility] Pager: [phone] 01/12/2023, 7:22 AM

## 2023-01-12 NOTE — Progress Notes (Signed)
*  PRELIMINARY RESULTS* Echocardiogram 2D Echocardiogram has been performed.  Carolyne Fiscal 01/12/2023, 9:41 AM

## 2023-01-13 LAB — COMPREHENSIVE METABOLIC PANEL
ALT: 13 U/L (ref 0–44)
AST: 14 U/L — ABNORMAL LOW (ref 15–41)
Albumin: 3.3 g/dL — ABNORMAL LOW (ref 3.5–5.0)
Alkaline Phosphatase: 63 U/L (ref 38–126)
Anion gap: 11 (ref 5–15)
BUN: 14 mg/dL (ref 8–23)
CO2: 33 mmol/L — ABNORMAL HIGH (ref 22–32)
Calcium: 10.1 mg/dL (ref 8.9–10.3)
Chloride: 90 mmol/L — ABNORMAL LOW (ref 98–111)
Creatinine, Ser: 0.59 mg/dL (ref 0.44–1.00)
GFR, Estimated: >60 mL/min (ref >60)
Glucose, Bld: 97 mg/dL (ref 70–99)
Potassium: 3.8 mmol/L (ref 3.5–5.1)
Sodium: 134 mmol/L — ABNORMAL LOW (ref 135–145)
Total Bilirubin: 1 mg/dL (ref 0.3–1.2)
Total Protein: 6.4 g/dL — ABNORMAL LOW (ref 6.5–8.1)

## 2023-01-13 LAB — CBC
HCT: 44.2 % (ref 36.0–46.0)
Hemoglobin: 14.8 g/dL (ref 12.0–15.0)
MCH: 31.2 pg (ref 26.0–34.0)
MCHC: 33.5 g/dL (ref 30.0–36.0)
MCV: 93.2 fL (ref 80.0–100.0)
Platelets: 278 10*3/uL (ref 150–400)
RBC: 4.74 MIL/uL (ref 3.87–5.11)
RDW: 13.1 % (ref 11.5–15.5)
WBC: 9.3 10*3/uL (ref 4.0–10.5)
nRBC: 0 % (ref 0.0–0.2)

## 2023-01-13 LAB — BLOOD GAS, VENOUS
Acid-Base Excess: 8.8 mmol/L — ABNORMAL HIGH (ref 0.0–2.0)
Bicarbonate: 39.5 mmol/L — ABNORMAL HIGH (ref 20.0–28.0)
O2 Saturation: 85.8 %
Patient temperature: 37
pCO2, Ven: 88 mm[Hg] (ref 44–60)
pH, Ven: 7.26 (ref 7.25–7.43)
pO2, Ven: 54 mm[Hg] — ABNORMAL HIGH (ref 32–45)

## 2023-01-13 LAB — MAGNESIUM: Magnesium: 1.8 mg/dL (ref 1.7–2.4)

## 2023-01-13 MED ORDER — FUROSEMIDE 40 MG PO TABS
40.0000 mg | ORAL_TABLET | Freq: Every day | ORAL | Status: DC
  Administered 2023-01-13 – 2023-01-15 (×3): 40 mg via ORAL
  Filled 2023-01-13 (×3): qty 1

## 2023-01-13 NOTE — Progress Notes (Addendum)
Progress Note   Patient: Virginia Clements:[id number] DOB: 10-31-1929 DOA: 01/11/2023     2 DOS: the patient was seen and examined on 01/13/2023    Subjective:  Patient seen and examined at bedside this morning Mental status is much better today Patient has been weaned off BiPAP now Denied worsening shortness of breath chest pain nausea vomiting or abdominal pain   Brief hospital course: From HPI "Virginia Clements is a 89 y.o. female with medical history significant of wheelchair-bound, HFpEF, episode of atrial fibrillation, hypertension, breast cancer status post radiation therapy, depression presenting with encephalopathy, acute on chronic HFpEF,?  UTI.  Limited history in the setting of encephalopathy.  Per report, patient with decreased urine output at home as well as odorous urine.  EMS evaluated the patient at home with O2 being placed to 4 L to keep O2 sats above 80% on room air.  No reports of fevers or chills.  No reports of nausea vomiting, chest pain or shortness of breath.  Noted admission December 2023 with episode of paroxysmal A-fib with self resolved after IV metoprolol x 1 as well as diagnosis of grade 1 diastolic dysfunction on 2D echo during that admission.  Unclear if patient has had any cardiology follow-up from this point. Presented to the ER afebrile, hemodynamically stable.  Satting in the low 80s on room air.  Transition to 4 L nasal cannula to keep O2 sats greater than 97%.  White count 10.1, hemoglobin 15, platelets 268, troponin negative x 1, urinalysis minimally to indicative of infection, COVID flu and RSV negative, creatinine 0.5.  BNP within normal limits.  CT of the chest negative for PE but does show changes consistent with pulmonary edema as well as pulmonary hypertension.  "  Assessment and Plan:   Acute metabolic encephalopathy secondary to hypercapnic respiratory failure Suspect multifactorial in the setting of hypoxic respiratory failure,  acute on chronic HFpEF ?  UTI with some component of general fatigue ABG showing improvement in hypercapnia Continue ceftriaxone for UTI    Acute on chronic heart failure with preserved ejection fraction (HFpEF) (HCC) 2D echo 03/22/2022 with EF of 60 to 65% and grade 1 diastolic dysfunction Acute decompensation today with noted new onset hypoxia, pulmonary edema on imaging and 3+ pitting edema bilaterally Continue to monitor daily weights as well as input and output IV Lasix has been switched to oral by cardiologist today Plan of care discussed with cardiology Follow-up on echocardiogram Follow-up recommendations   Acute respiratory failure with hypoxia and hypercapnia (HCC) Decompensated respiratory failure now requiring 4 L nasal cannula with concern for volume overload in the setting of acute on chronic HFpEF CTA negative PE, however showing pulmonary edema as well as pulmonary hypertension BNP within normal limits Patient initially required BiPAP which has been weaned off    Benign essential HTN BP stable Titrate home regimen   UTI (urinary tract infection) Per report, patient with decreased urinary output and foul-smelling urine Urinalysis minimally indicative of infection Continue current antibiotics Monitor urine culture results  Paroxysmal atrial fibrillation (HCC) Remote history of atrial fibrillation during hospitalization 03/2022-converted with IV metoprolol EKG normal sinus rhythm today Not felt to be a anticoagulation candidate during that admission Cardiology on board and case discussed    Advance Care Planning:   Code Status: Do not attempt resuscitation (DNR) PRE-ARREST INTERVENTIONS DESIRED    Consults: Cardiology    Family Communication: No family at the bedside   Physical Exam:  HENT:  Head: Normocephalic and atraumatic.     Nose: Nose normal.     Mouth/Throat:     Mouth: Mucous membranes are moist.  Eyes:     Pupils: Pupils are equal, round,  and reactive to light.  Cardiovascular:     Rate and Rhythm: Normal rate and regular rhythm.  Pulmonary:     Effort: Pulmonary effort is normal.     Breath sounds: No wheezing.  Abdominal:     General: Bowel sounds are normal.  Musculoskeletal: Edema improved  Data Reviewed: I have reviewed patient's lab results as shown below, vitals, I have also reviewed cardiology documentation, nursing documentation as well as transition of care managers documentation I personally reviewed patient's chest x-ray that did not show any acute process, I have also reviewed patient's CT angio of the chest that did not show any evidence of acute PE however findings concerning for vascular congestion seen.   Disposition: Status is: Inpatient   Planned Discharge Destination: Back to previous environment  Time spent: 55 minutes spent taking care of patient including review of above data as well as discussion of plan of care with cardiologist as well as previous hospitalist and admitting provider, more than 50 % of this time was spent at bedside providing one-to-one management to the patient as well as explaining goals of care with patient    Latest Ref Rng & Units 01/13/2023    3:18 AM 01/12/2023    6:02 AM 01/11/2023    5:01 AM  BMP  Glucose 70 - 99 mg/dL 97  87    BUN 8 - 23 mg/dL 14  11    Creatinine 1.61 - 1.00 mg/dL 0.96  0.45  4.09   Sodium 135 - 145 mmol/L 134  133    Potassium 3.5 - 5.1 mmol/L 3.8  3.4    Chloride 98 - 111 mmol/L 90  88    CO2 22 - 32 mmol/L 33  35    Calcium 8.9 - 10.3 mg/dL 81.1  9.8         Latest Ref Rng & Units 01/13/2023    3:18 AM 01/12/2023    6:02 AM 01/11/2023    5:01 AM  CBC  WBC 4.0 - 10.5 K/uL 9.3  11.2  10.5   Hemoglobin 12.0 - 15.0 g/dL 91.4  78.2  95.6   Hematocrit 36.0 - 46.0 % 44.2  42.0  43.5   Platelets 150 - 400 K/uL 278  251  285     Vitals:   01/12/23 2349 01/13/23 0419 01/13/23 0753 01/13/23 0839  BP: 129/63 (!) 156/93 (!) 161/88 (!) 146/76   Pulse: 88 93 98 95  Resp: 18 18 16 16   Temp: 98.4 F (36.9 C) 98.5 F (36.9 C) 98.5 F (36.9 C)   TempSrc: Oral Oral Oral   SpO2: 95% 92% 94% 94%  Weight:      Height:          Author: Loyce Dys, MD 01/13/2023 2:35 PM  For on call review [email or web address].

## 2023-01-13 NOTE — Progress Notes (Signed)
Progress Note  Patient Name: Virginia Clements Date of Encounter: 01/13/2023  Primary Cardiologist: New - consult by Kirke Corin   Subjective   Mcu more lucid this morning. No chest pain. Reports breathing is at her baseline.  Urine output + 250 mL for the past 24 hours, net -1.8 L for the admission.  Sodium improving.  Potassium 3.8.  Renal function stable with slight up trend in BUN/SCr.  pCO2 on VBG improved with BiPAP, now on room air.  Inpatient Medications    Scheduled Meds:  amLODipine  10 mg Oral Daily   enalapril  10 mg Oral Daily   enoxaparin (LOVENOX) injection  0.5 mg/kg Subcutaneous Q24H   furosemide  20 mg Intravenous Daily   metoprolol tartrate  12.5 mg Oral BID   nystatin   Topical TID   mouth rinse  15 mL Mouth Rinse 4 times per day   potassium chloride  40 mEq Oral BID   Continuous Infusions:  cefTRIAXone (ROCEPHIN)  IV 2 g (01/12/23 0950)   PRN Meds: acetaminophen **OR** acetaminophen, albuterol, ondansetron **OR** ondansetron (ZOFRAN) IV   Vital Signs    Vitals:   01/12/23 2043 01/12/23 2349 01/13/23 0419 01/13/23 0753  BP: 126/62 129/63 (!) 156/93 (!) 161/88  Pulse: 85 88 93 98  Resp: 19 18 18 16   Temp: 98.7 F (37.1 C) 98.4 F (36.9 C) 98.5 F (36.9 C) 98.5 F (36.9 C)  TempSrc: Oral Oral Oral Oral  SpO2: 92% 95% 92% 94%  Weight:      Height:        Intake/Output Summary (Last 24 hours) at 01/13/2023 0821 Last data filed at 01/13/2023 0750 Gross per 24 hour  Intake 700 ml  Output 900 ml  Net -200 ml   Filed Weights   01/10/23 2132 01/12/23 0500  Weight: 90.7 kg 93.5 kg    Telemetry    Sinus rhythm - Personally Reviewed  ECG    No new tracings - Personally Reviewed  Physical Exam   GEN: No acute distress.   Neck: JVD difficult to assess secondary to body habitus. Cardiac: RRR, no murmurs, rubs, or gallops.  Respiratory: Diminished and coarse breath sounds to anterior auscultation bilaterally.  GI: Soft, nontender,  non-distended.   MS: Mild bilateral lower extremity edema; No deformity. Neuro:  Alert and oriented x 3; Nonfocal.  Psych: Normal affect.  Labs    Chemistry Recent Labs  Lab 01/10/23 2324 01/11/23 0501 01/12/23 0602 01/13/23 0318  NA 127*  --  133* 134*  K 3.6  --  3.4* 3.8  CL 88*  --  88* 90*  CO2 30  --  35* 33*  GLUCOSE 118*  --  87 97  BUN 12  --  11 14  CREATININE 0.49 0.52 0.53 0.59  CALCIUM 9.7  --  9.8 10.1  PROT 6.8  --  6.0* 6.4*  ALBUMIN 3.8  --  3.2* 3.3*  AST 14*  --  14* 14*  ALT 13  --  12 13  ALKPHOS 76  --  62 63  BILITOT 0.8  --  0.9 1.0  GFRNONAA >60 >60 >60 >60  ANIONGAP 9  --  11 11     Hematology Recent Labs  Lab 01/11/23 0501 01/12/23 0602 01/13/23 0318  WBC 10.5 11.2* 9.3  RBC 4.75 4.45 4.74  HGB 14.7 14.2 14.8  HCT 43.5 42.0 44.2  MCV 91.6 94.4 93.2  MCH 30.9 31.9 31.2  MCHC 33.8 33.8 33.5  RDW 12.5 13.2 13.1  PLT 285 251 278    Cardiac EnzymesNo results for input(s): "TROPONINI" in the last 168 hours. No results for input(s): "TROPIPOC" in the last 168 hours.   BNP Recent Labs  Lab 01/10/23 2324  BNP 93.1     DDimer No results for input(s): "DDIMER" in the last 168 hours.   Radiology    CT Angio Chest PE W and/or Wo Contrast  Result Date: 01/11/2023 IMPRESSION: 1. Negative for acute pulmonary embolism. 2. Patchy bilateral airspace opacities and interlobular septal thickening, favor pulmonary edema. 3. Dilated main pulmonary artery measuring 34 mm in diameter can be seen with pulmonary hypertension. Aortic Atherosclerosis (ICD10-I70.0) and Emphysema (ICD10-J43.9). Electronically Signed   By: Minerva Fester M.D.   On: 01/11/2023 02:51   DG Chest Portable 1 View  Result Date: 01/11/2023 IMPRESSION: 1. No active disease. 2.  Aortic Atherosclerosis (ICD10-I70.0). Electronically Signed   By: Tish Frederickson M.D.   On: 01/11/2023 01:48    Cardiac Studies   2D echo 03/21/2022: 1. Left ventricular ejection fraction, by  estimation, is 60 to 65%. The  left ventricle has normal function. The left ventricle has no regional  wall motion abnormalities. There is moderate concentric left ventricular  hypertrophy. Left ventricular  diastolic parameters are consistent with Grade I diastolic dysfunction  (impaired relaxation).   2. Right ventricular systolic function is normal. The right ventricular  size is normal.   3. The mitral valve is normal in structure. Trivial mitral valve  regurgitation. No evidence of mitral stenosis.   4. The aortic valve is calcified. Aortic valve regurgitation is not  visualized. No aortic stenosis is present.   5. The inferior vena cava is normal in size with greater than 50%  respiratory variability, suggesting right atrial pressure of 3 mmHg.  __________   2D echo 06/16/2020: 1. Left ventricular ejection fraction, by estimation, is 60 to 65%. The  left ventricle has normal function. The left ventricle has no regional  wall motion abnormalities. There is mild left ventricular hypertrophy.  Left ventricular diastolic function  could not be evaluated.   2. Right ventricular systolic function is normal. The right ventricular  size is normal.   3. The mitral valve is grossly normal. No evidence of mitral valve  regurgitation.   4. The aortic valve is tricuspid. Aortic valve regurgitation is not  visualized. Mild aortic valve sclerosis is present, with no evidence of  aortic valve stenosis.   Patient Profile     89 y.o. female with history of HFpEF, PAF, diagnosed in 03/2022 in the setting of COVID not on OAC, diastolic dysfunction, emphysema noted on CT imaging, breast cancer status post radiation, HTN, depression, and obesity who is wheelchair-bound and presented with decreased urine output and was found to be hypoxic upon EMS arrival and is being seen today for the evaluation of volume overload at the request of Dr. Alvester Morin.   Assessment & Plan    1. Acute hypoxic and  hypercarbic respiratory failure: -Likely multifactorial including HFpEF, emphysema, and untreated OSA and likely OHS -Volume status is difficult to assess on exam -Reports breathing is back to her baseline -IV Lasix has been reduced from 20 mg bid to 20 mg daily by primary service today -Suspect this is more of a pulmonary driven process with CO2 retention   2.  Acute on chronic HFpEF: -Suspect she is approaching euvolemia -IV Lasix as outlined above -Suspect she can transition to oral diuretic over the next  24 hours -Monitor urine output with goal diuresis of 1 to 2 L per 24 hours -Would defer addition of SGLT2 inhibitor in the setting of advanced age and concern for off target effects   3.  Acute metabolic encephalopathy: -Resolved -Suspected to be multifactorial in the setting of acute hypoxic and hypercarbic respiratory failure, possible undiagnosed sleep apnea with OHS -Ongoing management per internal medicine   4.  Lone A-fib: -Documented episode during COVID illness in 03/2022, no further documented episodes available for review in [place] [facility] -Has not been maintained on [facility] as risk is felt to outweigh benefits -Maintaining sinus rhythm currently -CHA2DS2-VASc at least 6 (CHF, HTN, age x 2, vascular disease, sex category) -Defer addition of heparin drip at this time    5.  Remaining comorbidities per internal medicine       For questions or updates, please contact [facility] Please consult [email or web address] for contact info under Cardiology/STEMI.    Signed, Eula Listen, PA-C [facility] Pager: [phone] 01/13/2023, 8:21 AM

## 2023-01-14 DIAGNOSIS — I5033 Acute on chronic diastolic (congestive) heart failure: Secondary | ICD-10-CM | POA: Diagnosis not present

## 2023-01-14 DIAGNOSIS — R0902 Hypoxemia: Secondary | ICD-10-CM | POA: Diagnosis not present

## 2023-01-14 DIAGNOSIS — R0602 Shortness of breath: Secondary | ICD-10-CM | POA: Diagnosis not present

## 2023-01-14 DIAGNOSIS — E871 Hypo-osmolality and hyponatremia: Secondary | ICD-10-CM | POA: Diagnosis not present

## 2023-01-14 LAB — CBC WITH DIFFERENTIAL/PLATELET
Abs Immature Granulocytes: 0.04 10*3/uL (ref 0.00–0.07)
Basophils Absolute: 0.1 10*3/uL (ref 0.0–0.1)
Basophils Relative: 1 %
Eosinophils Absolute: 0.3 10*3/uL (ref 0.0–0.5)
Eosinophils Relative: 2 %
HCT: 43.6 % (ref 36.0–46.0)
Hemoglobin: 15 g/dL (ref 12.0–15.0)
Immature Granulocytes: 0 %
Lymphocytes Relative: 26 %
Lymphs Abs: 2.9 10*3/uL (ref 0.7–4.0)
MCH: 31.3 pg (ref 26.0–34.0)
MCHC: 34.4 g/dL (ref 30.0–36.0)
MCV: 90.8 fL (ref 80.0–100.0)
Monocytes Absolute: 1 10*3/uL (ref 0.1–1.0)
Monocytes Relative: 9 %
Neutro Abs: 7 10*3/uL (ref 1.7–7.7)
Neutrophils Relative %: 62 %
Platelets: 308 10*3/uL (ref 150–400)
RBC: 4.8 MIL/uL (ref 3.87–5.11)
RDW: 13.2 % (ref 11.5–15.5)
WBC: 11.3 10*3/uL — ABNORMAL HIGH (ref 4.0–10.5)
nRBC: 0 % (ref 0.0–0.2)

## 2023-01-14 LAB — BASIC METABOLIC PANEL
Anion gap: 13 (ref 5–15)
BUN: 14 mg/dL (ref 8–23)
CO2: 30 mmol/L (ref 22–32)
Calcium: 10.3 mg/dL (ref 8.9–10.3)
Chloride: 91 mmol/L — ABNORMAL LOW (ref 98–111)
Creatinine, Ser: 0.6 mg/dL (ref 0.44–1.00)
GFR, Estimated: >60 mL/min (ref >60)
Glucose, Bld: 110 mg/dL — ABNORMAL HIGH (ref 70–99)
Potassium: 3.8 mmol/L (ref 3.5–5.1)
Sodium: 134 mmol/L — ABNORMAL LOW (ref 135–145)

## 2023-01-14 MED ORDER — INFLUENZA VAC A&B SURF ANT ADJ 0.5 ML IM SUSY
0.5000 mL | PREFILLED_SYRINGE | INTRAMUSCULAR | Status: AC
  Administered 2023-01-15: 0.5 mL via INTRAMUSCULAR
  Filled 2023-01-14: qty 0.5

## 2023-01-14 NOTE — Progress Notes (Signed)
Progress Note   Patient: Virginia Clements [id number]:[id number] DOB: Jun 12, 1929 DOA: 01/11/2023     3 DOS: the patient was seen and examined on 01/14/2023    Subjective:  Patient seen and examined at bedside this morning Denies nausea vomiting abdominal pain chest pain or cough Shortness of breath improved IV Lasix being transitioned to oral today     Brief hospital course: From HPI "Virginia Clements is a 89 y.o. female with medical history significant of wheelchair-bound, HFpEF, episode of atrial fibrillation, hypertension, breast cancer status post radiation therapy, depression presenting with encephalopathy, acute on chronic HFpEF,?  UTI.  Limited history in the setting of encephalopathy.  Per report, patient with decreased urine output at home as well as odorous urine.  EMS evaluated the patient at home with O2 being placed to 4 L to keep O2 sats above 80% on room air.  No reports of fevers or chills.  No reports of nausea vomiting, chest pain or shortness of breath.  Noted admission December 2023 with episode of paroxysmal A-fib with self resolved after IV metoprolol x 1 as well as diagnosis of grade 1 diastolic dysfunction on 2D echo during that admission.  Unclear if patient has had any cardiology follow-up from this point. Presented to the ER afebrile, hemodynamically stable.  Satting in the low 80s on room air.  Transition to 4 L nasal cannula to keep O2 sats greater than 97%.  White count 10.1, hemoglobin 15, platelets 268, troponin negative x 1, urinalysis minimally to indicative of infection, COVID flu and RSV negative, creatinine 0.5.  BNP within normal limits.  CT of the chest negative for PE but does show changes consistent with pulmonary edema as well as pulmonary hypertension.  "   Assessment and Plan:   Acute metabolic encephalopathy secondary to hypercapnic respiratory failure Suspect multifactorial in the setting of hypoxic respiratory failure, acute on chronic HFpEF  ?  UTI with some component of general fatigue ABG showing improvement in hypercapnia Continue ceftriaxone for UTI     Acute on chronic heart failure with preserved ejection fraction (HFpEF) (HCC) 2D echo 03/22/2022 with EF of 60 to 65% and grade 1 diastolic dysfunction Acute decompensation today with noted new onset hypoxia, pulmonary edema on imaging and 3+ pitting edema bilaterally Continue to monitor daily weights as well as input and output Continue oral Lasix Plan of care discussed with cardiology Repeat echocardiogram report showing EF 55 to 60%   Acute respiratory failure with hypoxia and hypercapnia (HCC) Decompensated respiratory failure now requiring 4 L nasal cannula with concern for volume overload in the setting of acute on chronic HFpEF CTA negative PE, however showing pulmonary edema as well as pulmonary hypertension BNP within normal limits     Benign essential HTN BP stable Titrate home regimen   UTI (urinary tract infection) Per report, patient with decreased urinary output and foul-smelling urine Urinalysis minimally indicative of infection Has completed antibiotics   Paroxysmal atrial fibrillation (HCC) Remote history of atrial fibrillation during hospitalization 03/2022-converted with IV metoprolol EKG normal sinus rhythm today Not felt to be a anticoagulation candidate during that admission Cardiology on board and case discussed    Advance Care Planning:   Code Status: Do not attempt resuscitation (DNR) PRE-ARREST INTERVENTIONS DESIRED    Consults: Cardiology    Family Communication: No family at the bedside    Physical Exam:   HENT:     Head: Normocephalic and atraumatic.     Nose: Nose normal.  Mouth/Throat:     Mouth: Mucous membranes are moist.  Eyes:     Pupils: Pupils are equal, round, and reactive to light.  Cardiovascular:     Rate and Rhythm: Normal rate and regular rhythm.  Pulmonary:     Effort: Pulmonary effort is normal.      Breath sounds: No wheezing.  Abdominal:     General: Bowel sounds are normal.  Musculoskeletal: Edema improved   Data Reviewed: I have reviewed cardiology documentation, I discussed the case with PT OT and patient is bedbound and so they did not have to see patient, I have also reviewed nursing documentation as well as below mentioned vitals     Disposition: Status is: Inpatient    Planned Discharge Destination: Back to previous environment   Time spent: 40 minutes  Vitals:   01/14/23 0705 01/14/23 0755 01/14/23 1238 01/14/23 1600  BP:  (!) 143/85 134/78 (!) 139/91  Pulse:  99 96 (!) 101  Resp:  20 18 20   Temp:  98 F (36.7 C) 98.8 F (37.1 C) 98.7 F (37.1 C)  TempSrc:  Oral Oral Oral  SpO2:  97% 92% 94%  Weight: 89.1 kg 88.1 kg    Height:         Author: Loyce Dys, MD 01/14/2023 4:30 PM  For on call review [email or web address].

## 2023-01-15 ENCOUNTER — Telehealth: Payer: Self-pay | Admitting: Emergency Medicine

## 2023-01-15 ENCOUNTER — Telehealth: Payer: Self-pay | Admitting: Cardiovascular Disease

## 2023-01-15 DIAGNOSIS — E871 Hypo-osmolality and hyponatremia: Secondary | ICD-10-CM | POA: Diagnosis not present

## 2023-01-15 DIAGNOSIS — R0902 Hypoxemia: Secondary | ICD-10-CM | POA: Diagnosis not present

## 2023-01-15 DIAGNOSIS — I5033 Acute on chronic diastolic (congestive) heart failure: Secondary | ICD-10-CM | POA: Diagnosis not present

## 2023-01-15 DIAGNOSIS — R0602 Shortness of breath: Secondary | ICD-10-CM | POA: Diagnosis not present

## 2023-01-15 LAB — BASIC METABOLIC PANEL
Anion gap: 12 (ref 5–15)
BUN: 16 mg/dL (ref 8–23)
CO2: 31 mmol/L (ref 22–32)
Calcium: 10.5 mg/dL — ABNORMAL HIGH (ref 8.9–10.3)
Chloride: 91 mmol/L — ABNORMAL LOW (ref 98–111)
Creatinine, Ser: 0.68 mg/dL (ref 0.44–1.00)
GFR, Estimated: >60 mL/min (ref >60)
Glucose, Bld: 100 mg/dL — ABNORMAL HIGH (ref 70–99)
Potassium: 3.7 mmol/L (ref 3.5–5.1)
Sodium: 134 mmol/L — ABNORMAL LOW (ref 135–145)

## 2023-01-15 LAB — CBC WITH DIFFERENTIAL/PLATELET
Abs Immature Granulocytes: 0.04 10*3/uL (ref 0.00–0.07)
Basophils Absolute: 0.1 10*3/uL (ref 0.0–0.1)
Basophils Relative: 1 %
Eosinophils Absolute: 0.4 10*3/uL (ref 0.0–0.5)
Eosinophils Relative: 5 %
HCT: 46.1 % — ABNORMAL HIGH (ref 36.0–46.0)
Hemoglobin: 15.3 g/dL — ABNORMAL HIGH (ref 12.0–15.0)
Immature Granulocytes: 1 %
Lymphocytes Relative: 22 %
Lymphs Abs: 1.6 10*3/uL (ref 0.7–4.0)
MCH: 31.1 pg (ref 26.0–34.0)
MCHC: 33.2 g/dL (ref 30.0–36.0)
MCV: 93.7 fL (ref 80.0–100.0)
Monocytes Absolute: 0.7 10*3/uL (ref 0.1–1.0)
Monocytes Relative: 9 %
Neutro Abs: 4.7 10*3/uL (ref 1.7–7.7)
Neutrophils Relative %: 62 %
Platelets: 273 10*3/uL (ref 150–400)
RBC: 4.92 MIL/uL (ref 3.87–5.11)
RDW: 13.4 % (ref 11.5–15.5)
WBC: 7.5 10*3/uL (ref 4.0–10.5)
nRBC: 0 % (ref 0.0–0.2)

## 2023-01-15 MED ORDER — FUROSEMIDE 40 MG PO TABS: 40.0000 mg | ORAL_TABLET | Freq: Every day | ORAL | 0 refills | Status: DC

## 2023-01-15 NOTE — Progress Notes (Signed)
Pt discharged home. All DC paperwork sent with pt. Patient leaving with all personal belongings with EMS via stretcher. Sister [name] called and notified of pt departure

## 2023-01-15 NOTE — Care Management Important Message (Signed)
Important Message  Patient Details  Name: Virginia Clements MRN: [id number] Date of Birth: 1930/01/02   Important Message Given:  Yes - Medicare IM  I reviewed the Important Message from Medicare with the patient's sister, [name] via phone [phone]) and she was in agreement with today's discharge. I thanked her for her time.    Olegario Messier A Rosalee Tolley 01/15/2023, 1:32 PM

## 2023-01-15 NOTE — Telephone Encounter (Signed)
Two calls to pt's cell number (goes straight to VM and VM not setup) and two calls to pt's room (rings without answer)  Attempting to establish new pt care appt between 28 Oct and 8 Nov with Virginia Clements or [employer]

## 2023-01-15 NOTE — Discharge Summary (Signed)
Physician Discharge Summary   Patient: Virginia Clements MRN: [id number] DOB: 1929/07/09  Admit date:     01/11/2023  Discharge date: 01/15/23  Discharge Physician: Loyce Dys   PCP: Gracelyn Nurse, MD   Recommendations at discharge:  Follow-up with cardiology as well as primary care physician  Discharge Diagnoses:  Acute metabolic encephalopathy secondary to hypercapnic respiratory failure Acute on chronic heart failure with preserved ejection fraction (HFpEF) (HCC) Acute respiratory failure with hypoxia and hypercapnia (HCC)  Benign essential HTN UTI (urinary tract infection) Paroxysmal atrial fibrillation [facility])    Hospital Course: Virginia Clements is a 89 y.o. female with medical history significant of wheelchair-bound, HFpEF, episode of atrial fibrillation, hypertension, breast cancer status post radiation therapy, depression presenting with encephalopathy, acute on chronic HFpEF, and possible UTI.  EMS evaluated the patient at home with O2 being placed to 4 L to keep O2 sats above 80% on room air.  Presented to the ER afebrile, hemodynamically stable.  Satting in the low 80s on room air.  Transition to 4 L nasal cannula to keep O2 sats greater than 97%.  White count 10.1, hemoglobin 15, platelets 268, troponin negative x 1, urinalysis minimally to indicative of infection, COVID flu and RSV negative, creatinine 0.5.  BNP within normal limits.  CT of the chest negative for PE but does show changes consistent with pulmonary edema as well as pulmonary hypertension.  Patient was found to have severe hypercapnia requiring BiPAP.  Hypercapnia improved and patient was weaned off BiPAP with improvement in mental status.  She was also seen by cardiologist with diuresis and improvement in respiratory function.  Patient is currently back to her baseline and therefore being discharged today to follow-up with cardiology as well as primary care physician.   Consultants:  Cardiology Procedures performed: None Disposition: Home Diet recommendation:  Cardiac diet DISCHARGE MEDICATION: Allergies as of 01/15/2023       Reactions   Alendronate Other (See Comments)   Nervousness and shakiness per pt   Codeine    dizziness        Medication List     STOP taking these medications    famotidine 20 MG tablet Commonly known as: PEPCID   fexofenadine 180 MG tablet Commonly known as: ALLEGRA   guaiFENesin-dextromethorphan 100-10 MG/5ML syrup Commonly known as: ROBITUSSIN DM   HYDROcodone bit-homatropine 5-1.5 MG/5ML syrup Commonly known as: HYCODAN   nirmatrelvir/ritonavir (renal dosing) 10 x 150 MG & 10 x 100MG  Tabs Commonly known as: PAXLOVID   polyethylene glycol 17 g packet Commonly known as: MIRALAX / GLYCOLAX   zinc sulfate 220 (50 Zn) MG capsule       TAKE these medications    acetaminophen 325 MG tablet Commonly known as: TYLENOL Take 2 tablets (650 mg total) by mouth every 6 (six) hours as needed for mild pain (or Fever >/= 101).   albuterol 108 (90 Base) MCG/ACT inhaler Commonly known as: VENTOLIN HFA Inhale 2 puffs into the lungs every 4 (four) hours as needed for wheezing or shortness of breath.   amLODipine 10 MG tablet Commonly known as: NORVASC Take 1 tablet (10 mg total) by mouth daily.   busPIRone 10 MG tablet Commonly known as: BUSPAR Take 10 mg by mouth 2 (two) times daily.   Dermacloud Oint Apply 1 Application topically daily.   enalapril 10 MG tablet Commonly known as: VASOTEC Take 10 mg by mouth daily.   furosemide 40 MG tablet Commonly known as: LASIX Take 1 tablet (  40 mg total) by mouth daily. Start taking on: January 16, 2023   ipratropium 17 MCG/ACT inhaler Commonly known as: ATROVENT HFA Inhale 2 puffs into the lungs every 4 (four) hours.   metoprolol tartrate 25 MG tablet Commonly known as: LOPRESSOR Take 0.5 tablets (12.5 mg total) by mouth 2 (two) times daily. What changed: how much to  take   mirtazapine 7.5 MG tablet Commonly known as: REMERON Take 7.5 mg by mouth at bedtime.   naproxen 375 MG tablet Commonly known as: NAPROSYN Take 1 tablet (375 mg total) by mouth 2 (two) times daily as needed for mild pain or moderate pain. What changed: when to take this   timolol 0.5 % ophthalmic gel-forming Commonly known as: TIMOPTIC-XR Place 1 drop into both eyes at bedtime.        Discharge Exam: Filed Weights   01/14/23 0705 01/14/23 0755 01/15/23 0817  Weight: 89.1 kg 88.1 kg 87.3 kg   HENT:     Head: Normocephalic and atraumatic.     Nose: Nose normal.     Mouth/Throat:     Mouth: Mucous membranes are moist.  Eyes:     Pupils: Pupils are equal, round, and reactive to light.  Cardiovascular:     Rate and Rhythm: Normal rate and regular rhythm.  Pulmonary:     Effort: Pulmonary effort is normal.     Breath sounds: No wheezing.  Abdominal:     General: Bowel sounds are normal.  Musculoskeletal: Edema improved  Condition at discharge: good  The results of significant diagnostics from this hospitalization (including imaging, microbiology, ancillary and laboratory) are listed below for reference.   Imaging Studies: ECHOCARDIOGRAM COMPLETE  Result Date: 01/12/2023    ECHOCARDIOGRAM REPORT   Patient Name:   Virginia Clements Date of Exam: 01/12/2023 Medical Rec #:  [id number]               Height:       65.0 in Accession #:    [id number]              Weight:       206.1 lb Date of Birth:  Aug 04, 1929              BSA:          2.004 m Patient Age:    89 years                BP:           127/60 mmHg Patient Gender: F                       HR:           82 bpm. Exam Location:  [facility] Procedure: 2D Echo, Cardiac Doppler and Color Doppler Indications:     CHF  History:         Patient has prior history of Echocardiogram examinations, most                  recent 03/21/2022. CHF, Arrythmias:Atrial Fibrillation,                  Signs/Symptoms:Shortness of Breath;  Risk Factors:Hypertension.  Sonographer:     Mikki Harbor Referring Phys:  [id number] Andris Baumann Diagnosing Phys: Debbe Odea MD  Sonographer Comments: Technically challenging study due to limited acoustic windows and no subcostal window. Image acquisition challenging due to patient behavioral factors. IMPRESSIONS  1. Left ventricular ejection fraction,  40 mg total) by mouth daily. Start taking on: January 16, 2023   ipratropium 17 MCG/ACT inhaler Commonly known as: ATROVENT HFA Inhale 2 puffs into the lungs every 4 (four) hours.   metoprolol tartrate 25 MG tablet Commonly known as: LOPRESSOR Take 0.5 tablets (12.5 mg total) by mouth 2 (two) times daily. What changed: how much to  take   mirtazapine 7.5 MG tablet Commonly known as: REMERON Take 7.5 mg by mouth at bedtime.   naproxen 375 MG tablet Commonly known as: NAPROSYN Take 1 tablet (375 mg total) by mouth 2 (two) times daily as needed for mild pain or moderate pain. What changed: when to take this   timolol 0.5 % ophthalmic gel-forming Commonly known as: TIMOPTIC-XR Place 1 drop into both eyes at bedtime.        Discharge Exam: Filed Weights   01/14/23 0705 01/14/23 0755 01/15/23 0817  Weight: 89.1 kg 88.1 kg 87.3 kg   HENT:     Head: Normocephalic and atraumatic.     Nose: Nose normal.     Mouth/Throat:     Mouth: Mucous membranes are moist.  Eyes:     Pupils: Pupils are equal, round, and reactive to light.  Cardiovascular:     Rate and Rhythm: Normal rate and regular rhythm.  Pulmonary:     Effort: Pulmonary effort is normal.     Breath sounds: No wheezing.  Abdominal:     General: Bowel sounds are normal.  Musculoskeletal: Edema improved  Condition at discharge: good  The results of significant diagnostics from this hospitalization (including imaging, microbiology, ancillary and laboratory) are listed below for reference.   Imaging Studies: ECHOCARDIOGRAM COMPLETE  Result Date: 01/12/2023    ECHOCARDIOGRAM REPORT   Patient Name:   Virginia Clements Date of Exam: 01/12/2023 Medical Rec #:  [id number]               Height:       65.0 in Accession #:    [id number]              Weight:       206.1 lb Date of Birth:  Aug 04, 1929              BSA:          2.004 m Patient Age:    89 years                BP:           127/60 mmHg Patient Gender: F                       HR:           82 bpm. Exam Location:  [facility] Procedure: 2D Echo, Cardiac Doppler and Color Doppler Indications:     CHF  History:         Patient has prior history of Echocardiogram examinations, most                  recent 03/21/2022. CHF, Arrythmias:Atrial Fibrillation,                  Signs/Symptoms:Shortness of Breath;  Risk Factors:Hypertension.  Sonographer:     Mikki Harbor Referring Phys:  [id number] Andris Baumann Diagnosing Phys: Debbe Odea MD  Sonographer Comments: Technically challenging study due to limited acoustic windows and no subcostal window. Image acquisition challenging due to patient behavioral factors. IMPRESSIONS  1. Left ventricular ejection fraction,  Physician Discharge Summary   Patient: Virginia Clements MRN: [id number] DOB: 1929/07/09  Admit date:     01/11/2023  Discharge date: 01/15/23  Discharge Physician: Loyce Dys   PCP: Gracelyn Nurse, MD   Recommendations at discharge:  Follow-up with cardiology as well as primary care physician  Discharge Diagnoses:  Acute metabolic encephalopathy secondary to hypercapnic respiratory failure Acute on chronic heart failure with preserved ejection fraction (HFpEF) (HCC) Acute respiratory failure with hypoxia and hypercapnia (HCC)  Benign essential HTN UTI (urinary tract infection) Paroxysmal atrial fibrillation [facility])    Hospital Course: Virginia Clements is a 89 y.o. female with medical history significant of wheelchair-bound, HFpEF, episode of atrial fibrillation, hypertension, breast cancer status post radiation therapy, depression presenting with encephalopathy, acute on chronic HFpEF, and possible UTI.  EMS evaluated the patient at home with O2 being placed to 4 L to keep O2 sats above 80% on room air.  Presented to the ER afebrile, hemodynamically stable.  Satting in the low 80s on room air.  Transition to 4 L nasal cannula to keep O2 sats greater than 97%.  White count 10.1, hemoglobin 15, platelets 268, troponin negative x 1, urinalysis minimally to indicative of infection, COVID flu and RSV negative, creatinine 0.5.  BNP within normal limits.  CT of the chest negative for PE but does show changes consistent with pulmonary edema as well as pulmonary hypertension.  Patient was found to have severe hypercapnia requiring BiPAP.  Hypercapnia improved and patient was weaned off BiPAP with improvement in mental status.  She was also seen by cardiologist with diuresis and improvement in respiratory function.  Patient is currently back to her baseline and therefore being discharged today to follow-up with cardiology as well as primary care physician.   Consultants:  Cardiology Procedures performed: None Disposition: Home Diet recommendation:  Cardiac diet DISCHARGE MEDICATION: Allergies as of 01/15/2023       Reactions   Alendronate Other (See Comments)   Nervousness and shakiness per pt   Codeine    dizziness        Medication List     STOP taking these medications    famotidine 20 MG tablet Commonly known as: PEPCID   fexofenadine 180 MG tablet Commonly known as: ALLEGRA   guaiFENesin-dextromethorphan 100-10 MG/5ML syrup Commonly known as: ROBITUSSIN DM   HYDROcodone bit-homatropine 5-1.5 MG/5ML syrup Commonly known as: HYCODAN   nirmatrelvir/ritonavir (renal dosing) 10 x 150 MG & 10 x 100MG  Tabs Commonly known as: PAXLOVID   polyethylene glycol 17 g packet Commonly known as: MIRALAX / GLYCOLAX   zinc sulfate 220 (50 Zn) MG capsule       TAKE these medications    acetaminophen 325 MG tablet Commonly known as: TYLENOL Take 2 tablets (650 mg total) by mouth every 6 (six) hours as needed for mild pain (or Fever >/= 101).   albuterol 108 (90 Base) MCG/ACT inhaler Commonly known as: VENTOLIN HFA Inhale 2 puffs into the lungs every 4 (four) hours as needed for wheezing or shortness of breath.   amLODipine 10 MG tablet Commonly known as: NORVASC Take 1 tablet (10 mg total) by mouth daily.   busPIRone 10 MG tablet Commonly known as: BUSPAR Take 10 mg by mouth 2 (two) times daily.   Dermacloud Oint Apply 1 Application topically daily.   enalapril 10 MG tablet Commonly known as: VASOTEC Take 10 mg by mouth daily.   furosemide 40 MG tablet Commonly known as: LASIX Take 1 tablet (  40 mg total) by mouth daily. Start taking on: January 16, 2023   ipratropium 17 MCG/ACT inhaler Commonly known as: ATROVENT HFA Inhale 2 puffs into the lungs every 4 (four) hours.   metoprolol tartrate 25 MG tablet Commonly known as: LOPRESSOR Take 0.5 tablets (12.5 mg total) by mouth 2 (two) times daily. What changed: how much to  take   mirtazapine 7.5 MG tablet Commonly known as: REMERON Take 7.5 mg by mouth at bedtime.   naproxen 375 MG tablet Commonly known as: NAPROSYN Take 1 tablet (375 mg total) by mouth 2 (two) times daily as needed for mild pain or moderate pain. What changed: when to take this   timolol 0.5 % ophthalmic gel-forming Commonly known as: TIMOPTIC-XR Place 1 drop into both eyes at bedtime.        Discharge Exam: Filed Weights   01/14/23 0705 01/14/23 0755 01/15/23 0817  Weight: 89.1 kg 88.1 kg 87.3 kg   HENT:     Head: Normocephalic and atraumatic.     Nose: Nose normal.     Mouth/Throat:     Mouth: Mucous membranes are moist.  Eyes:     Pupils: Pupils are equal, round, and reactive to light.  Cardiovascular:     Rate and Rhythm: Normal rate and regular rhythm.  Pulmonary:     Effort: Pulmonary effort is normal.     Breath sounds: No wheezing.  Abdominal:     General: Bowel sounds are normal.  Musculoskeletal: Edema improved  Condition at discharge: good  The results of significant diagnostics from this hospitalization (including imaging, microbiology, ancillary and laboratory) are listed below for reference.   Imaging Studies: ECHOCARDIOGRAM COMPLETE  Result Date: 01/12/2023    ECHOCARDIOGRAM REPORT   Patient Name:   Virginia Clements Date of Exam: 01/12/2023 Medical Rec #:  [id number]               Height:       65.0 in Accession #:    [id number]              Weight:       206.1 lb Date of Birth:  Aug 04, 1929              BSA:          2.004 m Patient Age:    89 years                BP:           127/60 mmHg Patient Gender: F                       HR:           82 bpm. Exam Location:  [facility] Procedure: 2D Echo, Cardiac Doppler and Color Doppler Indications:     CHF  History:         Patient has prior history of Echocardiogram examinations, most                  recent 03/21/2022. CHF, Arrythmias:Atrial Fibrillation,                  Signs/Symptoms:Shortness of Breath;  Risk Factors:Hypertension.  Sonographer:     Mikki Harbor Referring Phys:  [id number] Andris Baumann Diagnosing Phys: Debbe Odea MD  Sonographer Comments: Technically challenging study due to limited acoustic windows and no subcostal window. Image acquisition challenging due to patient behavioral factors. IMPRESSIONS  1. Left ventricular ejection fraction,  40 mg total) by mouth daily. Start taking on: January 16, 2023   ipratropium 17 MCG/ACT inhaler Commonly known as: ATROVENT HFA Inhale 2 puffs into the lungs every 4 (four) hours.   metoprolol tartrate 25 MG tablet Commonly known as: LOPRESSOR Take 0.5 tablets (12.5 mg total) by mouth 2 (two) times daily. What changed: how much to  take   mirtazapine 7.5 MG tablet Commonly known as: REMERON Take 7.5 mg by mouth at bedtime.   naproxen 375 MG tablet Commonly known as: NAPROSYN Take 1 tablet (375 mg total) by mouth 2 (two) times daily as needed for mild pain or moderate pain. What changed: when to take this   timolol 0.5 % ophthalmic gel-forming Commonly known as: TIMOPTIC-XR Place 1 drop into both eyes at bedtime.        Discharge Exam: Filed Weights   01/14/23 0705 01/14/23 0755 01/15/23 0817  Weight: 89.1 kg 88.1 kg 87.3 kg   HENT:     Head: Normocephalic and atraumatic.     Nose: Nose normal.     Mouth/Throat:     Mouth: Mucous membranes are moist.  Eyes:     Pupils: Pupils are equal, round, and reactive to light.  Cardiovascular:     Rate and Rhythm: Normal rate and regular rhythm.  Pulmonary:     Effort: Pulmonary effort is normal.     Breath sounds: No wheezing.  Abdominal:     General: Bowel sounds are normal.  Musculoskeletal: Edema improved  Condition at discharge: good  The results of significant diagnostics from this hospitalization (including imaging, microbiology, ancillary and laboratory) are listed below for reference.   Imaging Studies: ECHOCARDIOGRAM COMPLETE  Result Date: 01/12/2023    ECHOCARDIOGRAM REPORT   Patient Name:   Virginia Clements Date of Exam: 01/12/2023 Medical Rec #:  [id number]               Height:       65.0 in Accession #:    [id number]              Weight:       206.1 lb Date of Birth:  Aug 04, 1929              BSA:          2.004 m Patient Age:    89 years                BP:           127/60 mmHg Patient Gender: F                       HR:           82 bpm. Exam Location:  [facility] Procedure: 2D Echo, Cardiac Doppler and Color Doppler Indications:     CHF  History:         Patient has prior history of Echocardiogram examinations, most                  recent 03/21/2022. CHF, Arrythmias:Atrial Fibrillation,                  Signs/Symptoms:Shortness of Breath;  Risk Factors:Hypertension.  Sonographer:     Mikki Harbor Referring Phys:  [id number] Andris Baumann Diagnosing Phys: Debbe Odea MD  Sonographer Comments: Technically challenging study due to limited acoustic windows and no subcostal window. Image acquisition challenging due to patient behavioral factors. IMPRESSIONS  1. Left ventricular ejection fraction,

## 2023-01-15 NOTE — Telephone Encounter (Signed)
-----   Message from Nurse Nell Range sent at 01/15/2023 12:12 PM EDT ----- Regarding: FW: Follow-up Thank you, Morrie Sheldon!  Danelle Earthly ----- Message ----- From: Donald Siva Sent: 01/13/2023   9:32 PM EDT To: Ursula Alert, RN Subject: FW: Follow-up                                  Danelle Earthly,  Please get this patient scheduled for hospital follow up with me or Dr. Kirke Corin in 2-4 weeks.  Thanks,  Ryan ----- Message ----- From: Sande Rives, MD Sent: 01/13/2023  11:05 AM EDT To: Sondra Barges, PA-C Subject: Follow-up                                      I signed off. 2-4 weeks with Kirke Corin. -W

## 2023-01-15 NOTE — Telephone Encounter (Signed)
LMOV for sister [name] and Caregiver Dianna to schedule a virtual visit appointment with Dr Kirke Corin or R Dunn within 2-4 weeks

## 2023-01-15 NOTE — TOC Transition Note (Signed)
Transition of Care [facility]) - CM/SW Discharge Note   Patient Details  Name: Virginia Clements MRN: [id number] Date of Birth: Oct 21, 1929  [facility] CM/SW Contact:  Truddie Hidden, RN Phone Number: 01/15/2023, 12:00 PM   Clinical Narrative:    Spoke with patient's sister, [name] regardig patient's discharge plans for today. Patient will discharge home via EMS. Patient's sister will be waiting for her. [name] request to be called when patient leaves. Nurse notified.  EMS arranged  Face sheet and medical necessity forms printed to the floor to be added to the discharge packet.    TOC signing off.           Patient Goals and CMS Choice      Discharge Placement                         Discharge Plan and Services Additional resources added to the After Visit Summary for                                       Social Determinants of Health (SDOH) Interventions SDOH Screenings   Food Insecurity: No Food Insecurity (05/31/2018)  Transportation Needs: No Transportation Needs (05/31/2018)  Financial Resource Strain: Low Risk  (05/31/2018)  Physical Activity: Unknown (05/31/2018)  Social Connections: Unknown (05/31/2018)  Stress: No Stress Concern Present (05/31/2018)  Tobacco Use: Medium Risk (01/10/2023)     Readmission Risk Interventions    06/16/2020    3:52 PM  Readmission Risk Prevention Plan  Post Dischage Appt Complete  Medication Screening Complete  Transportation Screening Complete

## 2023-01-19 ENCOUNTER — Telehealth: Payer: Self-pay | Admitting: Cardiovascular Disease

## 2023-01-19 NOTE — Telephone Encounter (Signed)
Scheduled 11/12

## 2023-01-19 NOTE — Telephone Encounter (Signed)
  Patient Consent for Virtual Visit        Virginia Clements has provided verbal consent on 01/19/2023 for a virtual visit (video or telephone).   CONSENT FOR VIRTUAL VISIT FOR:  Virginia Clements  By participating in this virtual visit I agree to the following:  I hereby voluntarily request, consent and authorize [facility] and its employed or contracted physicians, physician assistants, nurse practitioners or other licensed health care professionals (the Practitioner), to provide me with telemedicine health care services (the "Services") as deemed necessary by the treating Practitioner. I acknowledge and consent to receive the Services by the Practitioner via telemedicine. I understand that the telemedicine visit will involve communicating with the Practitioner through live audiovisual communication technology and the disclosure of certain medical information by electronic transmission. I acknowledge that I have been given the opportunity to request an in-person assessment or other available alternative prior to the telemedicine visit and am voluntarily participating in the telemedicine visit.  I understand that I have the right to withhold or withdraw my consent to the use of telemedicine in the course of my care at any time, without affecting my right to future care or treatment, and that the Practitioner or I may terminate the telemedicine visit at any time. I understand that I have the right to inspect all information obtained and/or recorded in the course of the telemedicine visit and may receive copies of available information for a reasonable fee.  I understand that some of the potential risks of receiving the Services via telemedicine include:  Delay or interruption in medical evaluation due to technological equipment failure or disruption; Information transmitted may not be sufficient (e.g. poor resolution of images) to allow for appropriate medical decision making  by the Practitioner; and/or  In rare instances, security protocols could fail, causing a breach of personal health information.  Furthermore, I acknowledge that it is my responsibility to provide information about my medical history, conditions and care that is complete and accurate to the best of my ability. I acknowledge that Practitioner's advice, recommendations, and/or decision may be based on factors not within their control, such as incomplete or inaccurate data provided by me or distortions of diagnostic images or specimens that may result from electronic transmissions. I understand that the practice of medicine is not an exact science and that Practitioner makes no warranties or guarantees regarding treatment outcomes. I acknowledge that a copy of this consent can be made available to me via my patient portal [facility] MyChart), or I can request a printed copy by calling the office of [facility].    I understand that my insurance will be billed for this visit.   I have read or had this consent read to me. I understand the contents of this consent, which adequately explains the benefits and risks of the Services being provided via telemedicine.  I have been provided ample opportunity to ask questions regarding this consent and the Services and have had my questions answered to my satisfaction. I give my informed consent for the services to be provided through the use of telemedicine in my medical care

## 2023-01-30 ENCOUNTER — Telehealth: Payer: Self-pay | Admitting: Emergency Medicine

## 2023-01-30 NOTE — Telephone Encounter (Signed)
Call to pt and sister (Mrs. Patria Mane) answered the phone reports that pt is bed bound and can't leave house for appt, reports that pt has telephone appt through caregiver Dianna with Dr Marcelino Duster, reports that pt is doing "pretty well today" and that pt is no longer having SOB and is taking meds as prescribed  This RN spoke to floor RN providing care, family, and caregiver while pt was hospitalized and requested DPR to be filled out.

## 2023-01-30 NOTE — Telephone Encounter (Signed)
-----   Message from Nurse Misty Stanley R sent at 01/29/2023  1:24 PM EDT ----- Hello,  Do either one of you know why the patient is scheduled for a telephone visit with Dr. Kirke Corin? He has not been seen in the office and is also post hospital. He will need an in office appointment with an EKG.  Thanks, Misty Stanley

## 2023-01-30 NOTE — Telephone Encounter (Signed)
Video visit will be needed in this case.

## 2023-01-31 NOTE — Telephone Encounter (Signed)
Call back to pt's sister [name]) and caregiver (Dianna) and both report that pt does "not want that", this RN explained the purpose of the cardiology consult and the background of the consultation after the last ED visit and they declined again.  This RN asked to speak to the pt and Dianna reported that there is "no way to get the phone her"  Message to be forwarded to PCP

## 2023-01-31 NOTE — Telephone Encounter (Signed)
Dianna, pt's caregiver, called and reports that she spoke to pt and sister, Mrs. Milinda Cave, yesterday after I called and "they don't want it [cardiology appointment]"  This RN confirmed that Mrs Milinda Cave didn't want an "in person appointment", caregiver want's me to call back at 4 pm (when she is there with pt) to arrange appt at that time  Mrs. Dickey/pt number called and message left

## 2023-02-13 ENCOUNTER — Telehealth: Payer: Medicare Other | Admitting: Cardiovascular Disease

## 2023-09-16 ENCOUNTER — Other Ambulatory Visit: Payer: Self-pay

## 2023-09-16 ENCOUNTER — Emergency Department
Admission: EM | Admit: 2023-09-16 | Discharge: 2023-09-16 | Disposition: A | Discharge status: Home / Self Care | Attending: Emergency Medicine | Admitting: Emergency Medicine

## 2023-09-16 ENCOUNTER — Emergency Department

## 2023-09-16 DIAGNOSIS — L03115 Cellulitis of right lower limb: Secondary | ICD-10-CM | POA: Insufficient documentation

## 2023-09-16 DIAGNOSIS — E876 Hypokalemia: Secondary | ICD-10-CM | POA: Diagnosis not present

## 2023-09-16 DIAGNOSIS — D72829 Elevated white blood cell count, unspecified: Secondary | ICD-10-CM | POA: Diagnosis not present

## 2023-09-16 DIAGNOSIS — I509 Heart failure, unspecified: Secondary | ICD-10-CM | POA: Diagnosis not present

## 2023-09-16 DIAGNOSIS — R0602 Shortness of breath: Secondary | ICD-10-CM | POA: Diagnosis present

## 2023-09-16 DIAGNOSIS — I110 Hypertensive heart disease with heart failure: Secondary | ICD-10-CM | POA: Insufficient documentation

## 2023-09-16 LAB — CBC
HCT: 46.4 % — ABNORMAL HIGH (ref 36.0–46.0)
Hemoglobin: 15.7 g/dL — ABNORMAL HIGH (ref 12.0–15.0)
MCH: 32.2 pg (ref 26.0–34.0)
MCHC: 33.8 g/dL (ref 30.0–36.0)
MCV: 95.1 fL (ref 80.0–100.0)
Platelets: 269 10*3/uL (ref 150–400)
RBC: 4.88 MIL/uL (ref 3.87–5.11)
RDW: 13.2 % (ref 11.5–15.5)
WBC: 11.1 10*3/uL — ABNORMAL HIGH (ref 4.0–10.5)
nRBC: 0 % (ref 0.0–0.2)

## 2023-09-16 LAB — COMPREHENSIVE METABOLIC PANEL WITH GFR
ALT: 11 U/L (ref 0–44)
AST: 19 U/L (ref 15–41)
Albumin: 3.7 g/dL (ref 3.5–5.0)
Alkaline Phosphatase: 67 U/L (ref 38–126)
Anion gap: 13 (ref 5–15)
BUN: 11 mg/dL (ref 8–23)
CO2: 31 mmol/L (ref 22–32)
Calcium: 10.8 mg/dL — ABNORMAL HIGH (ref 8.9–10.3)
Chloride: 97 mmol/L — ABNORMAL LOW (ref 98–111)
Creatinine, Ser: 0.74 mg/dL (ref 0.44–1.00)
GFR, Estimated: >60 mL/min (ref >60)
Glucose, Bld: 119 mg/dL — ABNORMAL HIGH (ref 70–99)
Potassium: 2.8 mmol/L — ABNORMAL LOW (ref 3.5–5.1)
Sodium: 141 mmol/L (ref 135–145)
Total Bilirubin: 0.8 mg/dL (ref 0.0–1.2)
Total Protein: 6.8 g/dL (ref 6.5–8.1)

## 2023-09-16 LAB — D-DIMER, QUANTITATIVE: D-Dimer, Quant: <0.27 ug{FEU}/mL (ref 0.00–0.50)

## 2023-09-16 LAB — BRAIN NATRIURETIC PEPTIDE: B Natriuretic Peptide: 37.8 pg/mL (ref 0.0–100.0)

## 2023-09-16 LAB — TROPONIN I (HIGH SENSITIVITY): Troponin I (High Sensitivity): 10 ng/L (ref <18)

## 2023-09-16 MED ORDER — POTASSIUM CHLORIDE CRYS ER 20 MEQ PO TBCR
40.0000 meq | EXTENDED_RELEASE_TABLET | Freq: Once | ORAL | Status: AC
  Administered 2023-09-16: 40 meq via ORAL
  Filled 2023-09-16: qty 2

## 2023-09-16 MED ORDER — SODIUM CHLORIDE 0.9 % IV SOLN
1.0000 g | Freq: Once | INTRAVENOUS | Status: AC
  Administered 2023-09-16: 1 g via INTRAVENOUS
  Filled 2023-09-16: qty 10

## 2023-09-16 MED ORDER — POTASSIUM CHLORIDE CRYS ER 20 MEQ PO TBCR: 20.0000 meq | EXTENDED_RELEASE_TABLET | Freq: Every day | ORAL | 0 refills | Status: DC

## 2023-09-16 MED ORDER — CEPHALEXIN 500 MG PO CAPS
500.0000 mg | ORAL_CAPSULE | Freq: Four times a day (QID) | ORAL | 0 refills | Status: AC
Start: 2023-09-16 — End: 2023-09-23

## 2023-09-16 MED ORDER — POTASSIUM CHLORIDE 10 MEQ/100ML IV SOLN
10.0000 meq | Freq: Once | INTRAVENOUS | Status: AC
  Administered 2023-09-16: 10 meq via INTRAVENOUS
  Filled 2023-09-16: qty 100

## 2023-09-16 NOTE — ED Triage Notes (Signed)
 Patient coming in from home. Home CNA stated that the patient was having trouble breathing patient says she's not. Patient RA stat was 92%. Patient has rash to the medial thigh. Patient bed bound. Patient is AOX2. Patients room air stat is 92%

## 2023-09-16 NOTE — ED Provider Notes (Signed)
 [facility] Provider Note    Event Date/Time   First MD Initiated Contact with Patient 09/16/23 1618     (approximate)   History   Shortness of Breath   HPI  Virginia Clements is a 89 year old female with history of CHF, A-fib, HTN presenting to the emergency department for evaluation of shortness of breath.  Patient reports it is common for her to feel short of breath, but reportedly her home CNA felt that the patient has had some increased work of breathing of the past couple of days.  Room air saturation of 92%.  Also noted to have a rash over her right medial thigh, bedbound at baseline.   Collateral history obtained from patient's sister and her husband.  They note that they have not noticed increase shortness of breath, but patient had significant weeping from her right upper thigh over the past couple of days.  Actually improved today, but given ongoing symptoms wanted patient to get evaluated.     Physical Exam   Triage Vital Signs: ED Triage Vitals  Encounter Vitals Group     BP 09/16/23 1624 128/84     Girls Systolic BP Percentile --      Girls Diastolic BP Percentile --      Boys Systolic BP Percentile --      Boys Diastolic BP Percentile --      Pulse Rate 09/16/23 1624 82     Resp 09/16/23 1624 20     Temp 09/16/23 1624 97.9 F (36.6 C)     Temp Source 09/16/23 1624 Oral     SpO2 09/16/23 1624 92 %     Weight 09/16/23 1621 198 lb 12.8 oz (90.2 kg)     Height 09/16/23 1621 5' 5 (1.651 m)     Head Circumference --      Peak Flow --      Pain Score 09/16/23 1620 0     Pain Loc --      Pain Education --      Exclude from Growth Chart --     Most recent vital signs: Vitals:   09/16/23 1630 09/16/23 1720  BP: 132/75   Pulse: 81 68  Resp: 18 20  Temp:    SpO2: 91% 100%     General: Awake, interactive  CV:  Regular rate, good peripheral perfusion.  Resp:  espirations not significantly labored, satting in the low 90s on  room air, somewhat diminished lung sounds without appreciable wheezing  Abd:  Nondistended. Neuro:  Symmetric facial movement, fluid speech Skin:  There is an area of erythema and swelling most notably over the right medial posterior thigh, no active drainage noted, no significant swelling over the distal lower extremity, not significantly tender to palpation   ED Results / Procedures / Treatments   Labs (all labs ordered are listed, but only abnormal results are displayed) Labs Reviewed  CBC - Abnormal; Notable for the following components:      Result Value   WBC 11.1 (*)    Hemoglobin 15.7 (*)    HCT 46.4 (*)    All other components within normal limits  COMPREHENSIVE METABOLIC PANEL WITH GFR - Abnormal; Notable for the following components:   Potassium 2.8 (*)    Chloride 97 (*)    Glucose, Bld 119 (*)    Calcium  10.8 (*)    All other components within normal limits  BRAIN NATRIURETIC PEPTIDE  D-DIMER, QUANTITATIVE  TROPONIN I (HIGH SENSITIVITY)  EKG EKG independently reviewed and interpreted by myself demonstrates:  EKG demonstrates sinus rhythm at a rate of 80, PR 234, QRS 95, QTc 451, no acute ST changes  RADIOLOGY Imaging independently reviewed and interpreted by myself demonstrates:  CXR without focal consolidation  Formal Radiology Read:  [facility] Chest Port 1 View Result Date: 09/16/2023 CLINICAL DATA:  Shortness of breath, hypoxia EXAM: PORTABLE CHEST 1 VIEW COMPARISON:  01/11/2023 FINDINGS: Single frontal view of the chest demonstrates stable enlargement of the cardiac silhouette. Continued ectasia and atherosclerosis of the thoracic aorta. Chronic parenchymal lung scarring without acute airspace disease, effusion, or pneumothorax. No acute bony abnormalities. IMPRESSION: 1. Stable chest, no acute process. Electronically Signed   By: Bobbye Burrow M.D.   On: 09/16/2023 17:22    PROCEDURES:  Critical Care performed: No  Procedures   MEDICATIONS ORDERED IN  ED: Medications  potassium chloride  SA (KLOR-CON  M) CR tablet 40 mEq (40 mEq Oral Given 09/16/23 1759)  potassium chloride  10 mEq in 100 mL IVPB (0 mEq Intravenous Stopped 09/16/23 1858)  cefTRIAXone  (ROCEPHIN ) 1 g in sodium chloride  0.9 % 100 mL IVPB (0 g Intravenous Stopped 09/16/23 1833)     IMPRESSION / MDM / ASSESSMENT AND PLAN / ED COURSE  I reviewed the triage vital signs and the nursing notes.  Differential diagnosis includes, but is not limited to, CHF exacerbation, pneumonia, pneumothorax, PE, ACS, deconditioning  Patient's presentation is most consistent with acute presentation with potential threat to life or bodily function.  89 year old female who presented with initial complaint of shortness of breath and right leg rash.  Stable vitals on presentation.  Labs with mild leukocytosis, normal D-dimer, troponin, BNP.  CMP notable for hypokalemia with K of 2.8.  Low suspicion of DVT with negative D-dimer.  No active weeping of patient's right leg on exam here, but does have some erythema and warmth in this area.  Suspect possible cellulitis.  I did discuss admission in the setting of her hypokalemia and cellulitis.  However, patient reports she adamantly wishes to be discharged home.  She is without evidence of sepsis.  I discussed this with her sister and sisters husband who are comfortable with plan for discharge.  She was noted to have low normal oxygen here, family does report she has as needed oxygen at home.  Will provide potassium IV and oral repletion here, DC with a few additional days of oral potassium.  Will also give initial dose of IV Rocephin  and discharged on oral antibiotics.     FINAL CLINICAL IMPRESSION(S) / ED DIAGNOSES   Final diagnoses:  Cellulitis of right lower extremity  Shortness of breath     Rx / DC Orders   ED Discharge Orders          Ordered    potassium chloride  SA (KLOR-CON  M) 20 MEQ tablet  Daily        09/16/23 1904    cephALEXin  (KEFLEX ) 500  MG capsule  4 times daily        09/16/23 1904             Note:  This document was prepared using Dragon voice recognition software and may include unintentional dictation errors.   Claria Crofts, MD 09/16/23 [phone]

## 2023-09-16 NOTE — Discharge Instructions (Addendum)
 You were seen in the ER today for evaluation of your shortness of breath.  Your testing for this was reassuring.  You do have swelling and redness on your leg but I am concerned may be related to a skin infection.  You were given a dose of IV antibiotics here and I have sent a prescription for antibiotics to your pharmacy.  Your potassium level was also low, I sent a few days of potassium to your pharmacy.  Follow-up with your primary care doctor for further evaluation.  Return to the ER for new or worsening symptoms.

## 2023-09-16 NOTE — ED Notes (Signed)
 Placed patient on 2L [place] due to oxygen dipping into high 80s.

## 2023-09-16 NOTE — ED Notes (Signed)
 Called to [facility] per RN Mady Schlichter @ 717pm/Rep [facility].

## 2023-10-30 ENCOUNTER — Other Ambulatory Visit: Payer: Self-pay

## 2023-10-30 ENCOUNTER — Emergency Department

## 2023-10-30 ENCOUNTER — Inpatient Hospital Stay
Admission: EM | Admit: 2023-10-30 | Discharge: 2023-11-07 | DRG: 291 | Disposition: A | Discharge status: Home / Self Care | Attending: Internal Medicine | Admitting: Internal Medicine

## 2023-10-30 DIAGNOSIS — J209 Acute bronchitis, unspecified: Secondary | ICD-10-CM | POA: Diagnosis present

## 2023-10-30 DIAGNOSIS — Z1612 Extended spectrum beta lactamase (ESBL) resistance: Secondary | ICD-10-CM | POA: Diagnosis present

## 2023-10-30 DIAGNOSIS — I959 Hypotension, unspecified: Secondary | ICD-10-CM | POA: Diagnosis not present

## 2023-10-30 DIAGNOSIS — F32A Depression, unspecified: Secondary | ICD-10-CM | POA: Diagnosis present

## 2023-10-30 DIAGNOSIS — N3001 Acute cystitis with hematuria: Secondary | ICD-10-CM | POA: Diagnosis not present

## 2023-10-30 DIAGNOSIS — Z8249 Family history of ischemic heart disease and other diseases of the circulatory system: Secondary | ICD-10-CM

## 2023-10-30 DIAGNOSIS — I9589 Other hypotension: Secondary | ICD-10-CM | POA: Diagnosis not present

## 2023-10-30 DIAGNOSIS — Z66 Do not resuscitate: Secondary | ICD-10-CM | POA: Diagnosis present

## 2023-10-30 DIAGNOSIS — E669 Obesity, unspecified: Secondary | ICD-10-CM | POA: Diagnosis not present

## 2023-10-30 DIAGNOSIS — J9622 Acute and chronic respiratory failure with hypercapnia: Secondary | ICD-10-CM | POA: Diagnosis present

## 2023-10-30 DIAGNOSIS — J9621 Acute and chronic respiratory failure with hypoxia: Secondary | ICD-10-CM | POA: Diagnosis present

## 2023-10-30 DIAGNOSIS — Z7189 Other specified counseling: Secondary | ICD-10-CM | POA: Diagnosis not present

## 2023-10-30 DIAGNOSIS — J9601 Acute respiratory failure with hypoxia: Secondary | ICD-10-CM | POA: Diagnosis present

## 2023-10-30 DIAGNOSIS — G9341 Metabolic encephalopathy: Secondary | ICD-10-CM | POA: Diagnosis present

## 2023-10-30 DIAGNOSIS — R4182 Altered mental status, unspecified: Principal | ICD-10-CM

## 2023-10-30 DIAGNOSIS — I5033 Acute on chronic diastolic (congestive) heart failure: Secondary | ICD-10-CM | POA: Diagnosis present

## 2023-10-30 DIAGNOSIS — N390 Urinary tract infection, site not specified: Secondary | ICD-10-CM | POA: Diagnosis present

## 2023-10-30 DIAGNOSIS — F3289 Other specified depressive episodes: Secondary | ICD-10-CM | POA: Diagnosis not present

## 2023-10-30 DIAGNOSIS — I480 Paroxysmal atrial fibrillation: Secondary | ICD-10-CM | POA: Diagnosis present

## 2023-10-30 DIAGNOSIS — I10 Essential (primary) hypertension: Secondary | ICD-10-CM | POA: Diagnosis not present

## 2023-10-30 DIAGNOSIS — I110 Hypertensive heart disease with heart failure: Secondary | ICD-10-CM | POA: Diagnosis present

## 2023-10-30 DIAGNOSIS — J441 Chronic obstructive pulmonary disease with (acute) exacerbation: Secondary | ICD-10-CM | POA: Diagnosis present

## 2023-10-30 DIAGNOSIS — Z7401 Bed confinement status: Secondary | ICD-10-CM | POA: Diagnosis not present

## 2023-10-30 DIAGNOSIS — I5032 Chronic diastolic (congestive) heart failure: Secondary | ICD-10-CM

## 2023-10-30 DIAGNOSIS — Z515 Encounter for palliative care: Secondary | ICD-10-CM

## 2023-10-30 DIAGNOSIS — Z923 Personal history of irradiation: Secondary | ICD-10-CM

## 2023-10-30 DIAGNOSIS — E871 Hypo-osmolality and hyponatremia: Secondary | ICD-10-CM | POA: Diagnosis present

## 2023-10-30 DIAGNOSIS — Z87891 Personal history of nicotine dependence: Secondary | ICD-10-CM

## 2023-10-30 DIAGNOSIS — R197 Diarrhea, unspecified: Secondary | ICD-10-CM | POA: Diagnosis present

## 2023-10-30 DIAGNOSIS — R7301 Impaired fasting glucose: Secondary | ICD-10-CM | POA: Diagnosis present

## 2023-10-30 DIAGNOSIS — Z789 Other specified health status: Secondary | ICD-10-CM | POA: Diagnosis not present

## 2023-10-30 DIAGNOSIS — E876 Hypokalemia: Secondary | ICD-10-CM | POA: Diagnosis present

## 2023-10-30 DIAGNOSIS — N3000 Acute cystitis without hematuria: Secondary | ICD-10-CM

## 2023-10-30 DIAGNOSIS — A0811 Acute gastroenteropathy due to Norwalk agent: Secondary | ICD-10-CM | POA: Diagnosis present

## 2023-10-30 DIAGNOSIS — B961 Klebsiella pneumoniae [K. pneumoniae] as the cause of diseases classified elsewhere: Secondary | ICD-10-CM | POA: Diagnosis present

## 2023-10-30 DIAGNOSIS — Z888 Allergy status to other drugs, medicaments and biological substances status: Secondary | ICD-10-CM

## 2023-10-30 DIAGNOSIS — J440 Chronic obstructive pulmonary disease with acute lower respiratory infection: Secondary | ICD-10-CM | POA: Diagnosis present

## 2023-10-30 DIAGNOSIS — B9689 Other specified bacterial agents as the cause of diseases classified elsewhere: Secondary | ICD-10-CM | POA: Diagnosis present

## 2023-10-30 DIAGNOSIS — E66811 Obesity, class 1: Secondary | ICD-10-CM | POA: Diagnosis present

## 2023-10-30 DIAGNOSIS — Z885 Allergy status to narcotic agent status: Secondary | ICD-10-CM

## 2023-10-30 DIAGNOSIS — Z6833 Body mass index (BMI) 33.0-33.9, adult: Secondary | ICD-10-CM

## 2023-10-30 DIAGNOSIS — M7989 Other specified soft tissue disorders: Secondary | ICD-10-CM | POA: Diagnosis not present

## 2023-10-30 DIAGNOSIS — Z79899 Other long term (current) drug therapy: Secondary | ICD-10-CM

## 2023-10-30 DIAGNOSIS — Z1152 Encounter for screening for COVID-19: Secondary | ICD-10-CM

## 2023-10-30 DIAGNOSIS — J9602 Acute respiratory failure with hypercapnia: Secondary | ICD-10-CM | POA: Diagnosis not present

## 2023-10-30 DIAGNOSIS — Z993 Dependence on wheelchair: Secondary | ICD-10-CM

## 2023-10-30 DIAGNOSIS — Z853 Personal history of malignant neoplasm of breast: Secondary | ICD-10-CM

## 2023-10-30 LAB — BASIC METABOLIC PANEL WITH GFR
Anion gap: 12 (ref 5–15)
BUN: 7 mg/dL — ABNORMAL LOW (ref 8–23)
CO2: 30 mmol/L (ref 22–32)
Calcium: 9.2 mg/dL (ref 8.9–10.3)
Chloride: 90 mmol/L — ABNORMAL LOW (ref 98–111)
Creatinine, Ser: 0.45 mg/dL (ref 0.44–1.00)
GFR, Estimated: >60 mL/min (ref >60)
Glucose, Bld: 110 mg/dL — ABNORMAL HIGH (ref 70–99)
Potassium: 3.9 mmol/L (ref 3.5–5.1)
Sodium: 132 mmol/L — ABNORMAL LOW (ref 135–145)

## 2023-10-30 LAB — GASTROINTESTINAL PANEL BY PCR, STOOL (REPLACES STOOL CULTURE)

## 2023-10-30 LAB — LACTIC ACID, PLASMA
Lactic Acid, Venous: 1 mmol/L (ref 0.5–1.9)
Lactic Acid, Venous: 1 mmol/L (ref 0.5–1.9)

## 2023-10-30 LAB — CBC WITH DIFFERENTIAL/PLATELET
Abs Immature Granulocytes: 0.41 K/uL — ABNORMAL HIGH (ref 0.00–0.07)
Basophils Absolute: 0.1 K/uL (ref 0.0–0.1)
Basophils Relative: 0 %
Eosinophils Absolute: 0.1 K/uL (ref 0.0–0.5)
Eosinophils Relative: 1 %
HCT: 39.6 % (ref 36.0–46.0)
Hemoglobin: 14 g/dL (ref 12.0–15.0)
Immature Granulocytes: 3 %
Lymphocytes Relative: 11 %
Lymphs Abs: 1.9 K/uL (ref 0.7–4.0)
MCH: 31.9 pg (ref 26.0–34.0)
MCHC: 35.4 g/dL (ref 30.0–36.0)
MCV: 90.2 fL (ref 80.0–100.0)
Monocytes Absolute: 0.6 K/uL (ref 0.1–1.0)
Monocytes Relative: 4 %
Neutro Abs: 13.7 K/uL — ABNORMAL HIGH (ref 1.7–7.7)
Neutrophils Relative %: 81 %
Platelets: 335 K/uL (ref 150–400)
RBC: 4.39 MIL/uL (ref 3.87–5.11)
RDW: 13.2 % (ref 11.5–15.5)
WBC: 16.6 K/uL — ABNORMAL HIGH (ref 4.0–10.5)
nRBC: 0.2 % (ref 0.0–0.2)

## 2023-10-30 LAB — COMPREHENSIVE METABOLIC PANEL WITH GFR
ALT: 11 U/L (ref 0–44)
AST: 19 U/L (ref 15–41)
Albumin: 3.1 g/dL — ABNORMAL LOW (ref 3.5–5.0)
Alkaline Phosphatase: 98 U/L (ref 38–126)
Anion gap: 12 (ref 5–15)
BUN: 8 mg/dL (ref 8–23)
CO2: 32 mmol/L (ref 22–32)
Calcium: 9.8 mg/dL (ref 8.9–10.3)
Chloride: 84 mmol/L — ABNORMAL LOW (ref 98–111)
Creatinine, Ser: 0.6 mg/dL (ref 0.44–1.00)
GFR, Estimated: >60 mL/min (ref >60)
Glucose, Bld: 124 mg/dL — ABNORMAL HIGH (ref 70–99)
Potassium: 2.6 mmol/L — CL (ref 3.5–5.1)
Sodium: 128 mmol/L — ABNORMAL LOW (ref 135–145)
Total Bilirubin: 0.9 mg/dL (ref 0.0–1.2)
Total Protein: 6.8 g/dL (ref 6.5–8.1)

## 2023-10-30 LAB — PHOSPHORUS: Phosphorus: 2 mg/dL — ABNORMAL LOW (ref 2.5–4.6)

## 2023-10-30 LAB — URINALYSIS, W/ REFLEX TO CULTURE (INFECTION SUSPECTED)
Bilirubin Urine: NEGATIVE
Glucose, UA: NEGATIVE mg/dL
Hgb urine dipstick: NEGATIVE
Ketones, ur: 5 mg/dL — AB
Nitrite: NEGATIVE
Protein, ur: NEGATIVE mg/dL
Specific Gravity, Urine: 1.014 (ref 1.005–1.030)
WBC, UA: >50 WBC/hpf (ref 0–5)
pH: 5 (ref 5.0–8.0)

## 2023-10-30 LAB — PROTIME-INR
INR: 0.9 (ref 0.8–1.2)
Prothrombin Time: 13 s (ref 11.4–15.2)

## 2023-10-30 LAB — RESP PANEL BY RT-PCR (RSV, FLU A&B, COVID)  RVPGX2
Influenza A by PCR: NEGATIVE
Influenza B by PCR: NEGATIVE
Resp Syncytial Virus by PCR: NEGATIVE
SARS Coronavirus 2 by RT PCR: NEGATIVE

## 2023-10-30 LAB — BLOOD GAS, VENOUS
Acid-Base Excess: 5.6 mmol/L — ABNORMAL HIGH (ref 0.0–2.0)
Bicarbonate: 34.4 mmol/L — ABNORMAL HIGH (ref 20.0–28.0)
O2 Saturation: 75.1 %
Patient temperature: 37
pCO2, Ven: 70 mmHg — ABNORMAL HIGH (ref 44–60)
pH, Ven: 7.3 (ref 7.25–7.43)
pO2, Ven: 44 mmHg (ref 32–45)

## 2023-10-30 LAB — C DIFFICILE QUICK SCREEN W PCR REFLEX
C Diff antigen: NEGATIVE
C Diff interpretation: NOT DETECTED
C Diff toxin: NEGATIVE

## 2023-10-30 LAB — OSMOLALITY: Osmolality: 271 mosm/kg — ABNORMAL LOW (ref 275–295)

## 2023-10-30 LAB — BRAIN NATRIURETIC PEPTIDE: B Natriuretic Peptide: 156.4 pg/mL — ABNORMAL HIGH (ref 0.0–100.0)

## 2023-10-30 LAB — OSMOLALITY, URINE: Osmolality, Ur: 360 mosm/kg (ref 300–900)

## 2023-10-30 LAB — SODIUM, URINE, RANDOM: Sodium, Ur: 59 mmol/L

## 2023-10-30 LAB — MAGNESIUM: Magnesium: 1.5 mg/dL — ABNORMAL LOW (ref 1.7–2.4)

## 2023-10-30 MED ORDER — SODIUM CHLORIDE 0.9 % IV SOLN
2.0000 g | Freq: Once | INTRAVENOUS | Status: AC
  Administered 2023-10-30: 2 g via INTRAVENOUS
  Filled 2023-10-30: qty 20

## 2023-10-30 MED ORDER — METHYLPREDNISOLONE SODIUM SUCC 125 MG IJ SOLR
125.0000 mg | Freq: Once | INTRAMUSCULAR | Status: AC
  Administered 2023-10-30: 125 mg via INTRAVENOUS
  Filled 2023-10-30: qty 2

## 2023-10-30 MED ORDER — ENOXAPARIN SODIUM 40 MG/0.4ML IJ SOSY
40.0000 mg | PREFILLED_SYRINGE | INTRAMUSCULAR | Status: DC
  Administered 2023-10-30 – 2023-11-06 (×8): 40 mg via SUBCUTANEOUS
  Filled 2023-10-30 (×8): qty 0.4

## 2023-10-30 MED ORDER — ACETAMINOPHEN 325 MG PO TABS: 650.0000 mg | ORAL_TABLET | Freq: Four times a day (QID) | ORAL | Status: DC | PRN

## 2023-10-30 MED ORDER — SODIUM CHLORIDE 0.9 % IV SOLN
2.0000 g | INTRAVENOUS | Status: DC
  Administered 2023-10-31 – 2023-11-01 (×2): 2 g via INTRAVENOUS
  Filled 2023-10-30 (×4): qty 20

## 2023-10-30 MED ORDER — LACTATED RINGERS IV BOLUS (SEPSIS)
1000.0000 mL | Freq: Once | INTRAVENOUS | Status: AC
  Administered 2023-10-30: 1000 mL via INTRAVENOUS

## 2023-10-30 MED ORDER — ALBUTEROL SULFATE (2.5 MG/3ML) 0.083% IN NEBU: 2.5000 mg | INHALATION_SOLUTION | RESPIRATORY_TRACT | Status: DC | PRN

## 2023-10-30 MED ORDER — IPRATROPIUM-ALBUTEROL 0.5-2.5 (3) MG/3ML IN SOLN
3.0000 mL | Freq: Four times a day (QID) | RESPIRATORY_TRACT | Status: DC
  Administered 2023-10-30 – 2023-11-05 (×23): 3 mL via RESPIRATORY_TRACT
  Filled 2023-10-30 (×23): qty 3

## 2023-10-30 MED ORDER — ONDANSETRON HCL 4 MG/2ML IJ SOLN: 4.0000 mg | Freq: Three times a day (TID) | INTRAMUSCULAR | Status: DC | PRN

## 2023-10-30 MED ORDER — DM-GUAIFENESIN ER 30-600 MG PO TB12: 1.0000 | ORAL_TABLET | Freq: Two times a day (BID) | ORAL | Status: DC | PRN

## 2023-10-30 MED ORDER — SODIUM CHLORIDE 0.9 % IV SOLN
500.0000 mg | INTRAVENOUS | Status: DC
  Administered 2023-10-31 – 2023-11-01 (×2): 500 mg via INTRAVENOUS
  Filled 2023-10-30 (×4): qty 5

## 2023-10-30 MED ORDER — HYDRALAZINE HCL 20 MG/ML IJ SOLN: 5.0000 mg | INTRAMUSCULAR | Status: DC | PRN

## 2023-10-30 MED ORDER — FUROSEMIDE 10 MG/ML IJ SOLN
40.0000 mg | Freq: Once | INTRAMUSCULAR | Status: AC
  Administered 2023-10-30: 40 mg via INTRAVENOUS
  Filled 2023-10-30: qty 4

## 2023-10-30 MED ORDER — SODIUM CHLORIDE 1 G PO TABS
1.0000 g | ORAL_TABLET | Freq: Two times a day (BID) | ORAL | Status: DC
  Filled 2023-10-30: qty 1

## 2023-10-30 MED ORDER — POTASSIUM CHLORIDE 20 MEQ PO PACK
40.0000 meq | PACK | Freq: Once | ORAL | Status: AC
  Administered 2023-10-30: 40 meq via ORAL
  Filled 2023-10-30: qty 2

## 2023-10-30 MED ORDER — METHYLPREDNISOLONE SODIUM SUCC 125 MG IJ SOLR
80.0000 mg | Freq: Every day | INTRAMUSCULAR | Status: DC
  Administered 2023-10-31 – 2023-11-05 (×6): 80 mg via INTRAVENOUS
  Filled 2023-10-30 (×6): qty 2

## 2023-10-30 MED ORDER — SODIUM CHLORIDE 0.9 % IV SOLN
500.0000 mg | Freq: Once | INTRAVENOUS | Status: AC
  Administered 2023-10-30: 500 mg via INTRAVENOUS
  Filled 2023-10-30: qty 5

## 2023-10-30 MED ORDER — POTASSIUM CHLORIDE 10 MEQ/100ML IV SOLN
10.0000 meq | INTRAVENOUS | Status: AC
  Administered 2023-10-30 (×4): 10 meq via INTRAVENOUS
  Filled 2023-10-30 (×4): qty 100

## 2023-10-30 MED ORDER — POTASSIUM PHOSPHATES 15 MMOLE/5ML IV SOLN
15.0000 mmol | Freq: Once | INTRAVENOUS | Status: AC
  Administered 2023-10-31: 15 mmol via INTRAVENOUS
  Filled 2023-10-30: qty 5

## 2023-10-30 MED ORDER — LACTATED RINGERS IV SOLN: INTRAVENOUS | Status: DC

## 2023-10-30 MED ORDER — METRONIDAZOLE 500 MG/100ML IV SOLN
500.0000 mg | Freq: Once | INTRAVENOUS | Status: AC
  Administered 2023-10-30: 500 mg via INTRAVENOUS
  Filled 2023-10-30: qty 100

## 2023-10-30 MED ORDER — MAGNESIUM SULFATE 2 GM/50ML IV SOLN
2.0000 g | Freq: Once | INTRAVENOUS | Status: AC
  Administered 2023-10-31: 2 g via INTRAVENOUS
  Filled 2023-10-30: qty 50

## 2023-10-30 MED ORDER — METOPROLOL TARTRATE 5 MG/5ML IV SOLN: 2.5000 mg | INTRAVENOUS | Status: DC | PRN

## 2023-10-30 MED ORDER — FUROSEMIDE 10 MG/ML IJ SOLN
40.0000 mg | Freq: Two times a day (BID) | INTRAMUSCULAR | Status: DC
  Administered 2023-10-31: 40 mg via INTRAVENOUS
  Filled 2023-10-30 (×2): qty 4

## 2023-10-30 NOTE — ED Notes (Signed)
 Pt incontinent of watery stool. Pt cleaned by this tech, Dwayne, EDT, and [place], NT with warm peri wipes, clean linen placed on bed, and pt in clean gown. While cleaning pt, pt continued to have watery BM. MD Bradler notified of pt watery BMs.  Stool sample sent to the lab at this time. Pt placed on cardiac monitor. Two warm blankets given to pt. Call light within reach. Fall bundle in place.

## 2023-10-30 NOTE — ED Notes (Signed)
 Applied Pure wick to pt r/t Lasix  order, placed mittens on pt r/t to her removing all monitoring and IV access. Placed warm blanket, asked pt if she was doing okay, pt stated yes, good night.

## 2023-10-30 NOTE — ED Notes (Addendum)
 When pt was placed on cardiac monitor, this tech noticed that pt O2 saturation was between 76-78% on RA. Pt placed on 2L O2 via [place]. Pt O2 sat went to 88% on 2L O2. Pt on 3L O2 at this time and O2 sat 91%. Cathy, RN made aware of pt O2 sat on RA and is at bedside. O2 now at 4L via [place] and pt O2 sat is now at 95% on 4L O2. Pt is now resting comfortably.

## 2023-10-30 NOTE — H&P (Signed)
 History and Physical    Virginia Clements FMW:[id number] DOB: [birth year] DOA: 10/30/2023  Referring MD/NP/PA:   PCP: Rudolpho Norleen BIRCH, MD   Patient coming from:  The patient is coming from group home?   Chief Complaint: cough, diarrhea, AMS  HPI: Virginia Clements is a 89 y.o. female with medical history significant of wheelchair-bound, HFpEF, PAF not on AC, HTN, breast cancer status post radiation therapy, depression , who presents with cough, diarrhea, AMS.  Patient has AMS,  and is unable to provide any medical history, therefore, most of the history is obtained by discussing the case with ED physician, per EMS report, and with the nursing staff.  Per report, pt has cough in the past several days.  No fever.  Her temperature is 98.4 in ED. Chart review revealed that patient is not using oxygen and is oriented x 3 at her normal baseline.  She was found to have moderate acute respiratory distress.  He has oxygen desaturation to 80-82% on room air, which improved to 96% on 4L oxygen.  Not sure if patient has any chest pain.  She has mild wheezing on auscultation by my examination.  She has altered mental status, not arousable, not following command.  She moves all extremities on painful stimuli.  No facial droop noted.  Per report, patient has several episode of diarrhea.  No active nausea vomiting noted.  Does not seem to have abdominal tenderness on examination.  Not sure if patient has symptoms of UTI.  Data reviewed independently and ED Course: pt was found to have WBC 16.6, lactic acid 1.0 --> 1.0, negative PCR for COVID, flu and RSV, UA (cloudy appearance, small amount of leukocyte, rare bacteria, WBC> 50, squamous cell 6-10), sodium 128, potassium 2.6, GFR> 60, negative C. difficile test. Temperature normal, blood pressure 129/58, heart rate 50-90s, RR 24 --> 19.  VBG with pH 7.3, CO2 70, O2 44.  Chest x-ray showed cardiomegaly, vascular congestion without infiltration.  CT of  head negative for acute intracranial abnormalities.  Patient is admitted to PCU as inpatient.   EKG: I have personally reviewed.  Seem to be sinus rhythm, QTc 461, heart rate 70, nonspecific T wave change.   Review of Systems: Cannot be reviewed due to altered mental status.    Allergy:  Allergies  Allergen Reactions   Alendronate Other (See Comments)    Nervousness and shakiness per pt   Codeine     dizziness    Past Medical History:  Diagnosis Date   Breast cancer (HCC)    Hypertension    Hypoglycemia    PAF (paroxysmal atrial fibrillation) (HCC)    Radiation 2009    Past Surgical History:  Procedure Laterality Date   BREAST BIOPSY Right 2009    Social History:  reports that she has quit smoking. She has never used smokeless tobacco. She reports that she does not currently use alcohol. She reports that she does not use drugs.  Family History:  Family History  Problem Relation Age of Onset   Hypertension Mother      Prior to Admission medications   Medication Sig Start Date End Date Taking? Authorizing Provider  acetaminophen  (TYLENOL ) 325 MG tablet Take 2 tablets (650 mg total) by mouth every 6 (six) hours as needed for mild pain (or Fever >/= 101). Patient not taking: Reported on 03/17/2022 06/04/18   Vachhani, Vaibhavkumar, MD  albuterol  (VENTOLIN  HFA) 108 [phone] Base) MCG/ACT inhaler Inhale 2 puffs into the lungs every 4 (  four) hours as needed for wheezing or shortness of breath. 03/20/22   Alexander, Natalie, DO  amLODipine  (NORVASC ) 10 MG tablet Take 1 tablet (10 mg total) by mouth daily. 06/05/18   Vachhani, Vaibhavkumar, MD  busPIRone  (BUSPAR ) 10 MG tablet Take 10 mg by mouth 2 (two) times daily.    [provider]  enalapril  (VASOTEC ) 10 MG tablet Take 10 mg by mouth daily.    [provider]  furosemide  (LASIX ) 40 MG tablet Take 1 tablet (40 mg total) by mouth daily. 01/16/23   Dorinda Drue DASEN, MD  [employer] [facility]) OINT Apply 1  Application topically daily. 01/12/22   [provider]  ipratropium (ATROVENT  HFA) 17 MCG/ACT inhaler Inhale 2 puffs into the lungs every 4 (four) hours. Patient not taking: Reported on 01/11/2023 03/20/22   Alexander, Natalie, DO  metoprolol  tartrate (LOPRESSOR ) 25 MG tablet Take 0.5 tablets (12.5 mg total) by mouth 2 (two) times daily. Patient taking differently: Take 25 mg by mouth 2 (two) times daily. 03/20/22   Alexander, Natalie, DO  mirtazapine  (REMERON ) 7.5 MG tablet Take 7.5 mg by mouth at bedtime.    [provider]  naproxen  (NAPROSYN ) 375 MG tablet Take 1 tablet (375 mg total) by mouth 2 (two) times daily as needed for mild pain or moderate pain. Patient taking differently: Take 375 mg by mouth 2 (two) times daily. 08/29/20   Patsy Lenis, MD  potassium chloride  SA (KLOR-CON  M) 20 MEQ tablet Take 1 tablet (20 mEq total) by mouth daily for 3 days. 09/16/23 09/19/23  Levander Slate, MD  timolol  (TIMOPTIC -XR) 0.5 % ophthalmic gel-forming Place 1 drop into both eyes at bedtime. 07/06/14   [provider]    Physical Exam: Vitals:   10/30/23 2130 10/30/23 2200 10/30/23 2230 10/30/23 2252  BP: (!) 155/67 122/62    Pulse: 81 71 78   Resp: (!) 27 (!) 23 (!) 21   Temp:    (!) 97.5 F (36.4 C)  TempSrc:    Axillary  SpO2: 99% 94% 96%   Weight:      Height:       General: Not in acute distress HEENT:       Eyes: PERRL, EOMI, no jaundice       ENT: No discharge from the ears and nose       Neck: Difficult to assess JVD due to obesity, no bruit, no mass felt. Heme: No neck lymph node enlargement. Cardiac: S1/S2, RRR, No murmurs, No gallops or rubs. Respiratory: Has mild wheezing bilaterally (right is worse than in the left) GI: Soft, nondistended, nontender, no organomegaly, BS present. GU: No hematuria Ext: 1+ pitting leg edema bilaterally. 1+DP/PT pulse bilaterally. Musculoskeletal: No joint deformities, No joint redness or warmth, no limitation of ROM in  spin. Skin: No rashes.  Neuro: Patient is not arousable, not following command, slightly moves extremities on painful stimuli. Cranial nerves II-XII grossly intact,  Psych: Could not be assessed  Labs on Admission: I have personally reviewed following labs and imaging studies  CBC: Recent Labs  Lab 10/30/23 1616  WBC 16.6*  NEUTROABS 13.7*  HGB 14.0  HCT 39.6  MCV 90.2  PLT 335   Basic Metabolic Panel: Recent Labs  Lab 10/30/23 1616 10/30/23 2151  NA 128* 132*  K 2.6* 3.9  CL 84* 90*  CO2 32 30  GLUCOSE 124* 110*  BUN 8 7*  CREATININE 0.60 0.45  CALCIUM  9.8 9.2  MG  --  1.5*  PHOS  --  2.0*   GFR: Estimated Creatinine Clearance: 51 mL/min (by C-G formula based on SCr of 0.45 mg/dL). Liver Function Tests: Recent Labs  Lab 10/30/23 1616  AST 19  ALT 11  ALKPHOS 98  BILITOT 0.9  PROT 6.8  ALBUMIN 3.1*   No results for input(s): LIPASE, AMYLASE in the last 168 hours. No results for input(s): AMMONIA in the last 168 hours. Coagulation Profile: Recent Labs  Lab 10/30/23 1616  INR 0.9   Cardiac Enzymes: No results for input(s): CKTOTAL, CKMB, CKMBINDEX, TROPONINI in the last 168 hours. BNP (last 3 results) No results for input(s): PROBNP in the last 8760 hours. HbA1C: No results for input(s): HGBA1C in the last 72 hours. CBG: No results for input(s): GLUCAP in the last 168 hours. Lipid Profile: No results for input(s): CHOL, HDL, LDLCALC, TRIG, CHOLHDL, LDLDIRECT in the last 72 hours. Thyroid Function Tests: No results for input(s): TSH, T4TOTAL, FREET4, T3FREE, THYROIDAB in the last 72 hours. Anemia Panel: No results for input(s): VITAMINB12, FOLATE, FERRITIN, TIBC, IRON, RETICCTPCT in the last 72 hours. Urine analysis:    Component Value Date/Time   COLORURINE YELLOW (A) 10/30/2023 1745   APPEARANCEUR CLOUDY (A) 10/30/2023 1745   APPEARANCEUR Clear 08/10/2011 0226   LABSPEC 1.014 10/30/2023  1745   LABSPEC 1.025 08/10/2011 0226   PHURINE 5.0 10/30/2023 1745   GLUCOSEU NEGATIVE 10/30/2023 1745   GLUCOSEU Negative 08/10/2011 0226   HGBUR NEGATIVE 10/30/2023 1745   BILIRUBINUR NEGATIVE 10/30/2023 1745   BILIRUBINUR Negative 08/10/2011 0226   KETONESUR 5 (A) 10/30/2023 1745   PROTEINUR NEGATIVE 10/30/2023 1745   NITRITE NEGATIVE 10/30/2023 1745   LEUKOCYTESUR SMALL (A) 10/30/2023 1745   LEUKOCYTESUR Negative 08/10/2011 0226   Sepsis Labs: @LABRCNTIP (procalcitonin:4,lacticidven:4) ) Recent Results (from the past 240 hours)  Resp panel by RT-PCR (RSV, Flu A&B, Covid) Anterior Nasal Swab     Status: None   Collection Time: 10/30/23  5:45 PM   Specimen: Anterior Nasal Swab  Result Value Ref Range Status   SARS Coronavirus 2 by RT PCR NEGATIVE NEGATIVE Final    Comment: (NOTE) SARS-CoV-2 target nucleic acids are NOT DETECTED.  The SARS-CoV-2 RNA is generally detectable in upper respiratory specimens during the acute phase of infection. The lowest concentration of SARS-CoV-2 viral copies this assay can detect is 138 copies/mL. A negative result does not preclude SARS-Cov-2 infection and should not be used as the sole basis for treatment or other patient management decisions. A negative result may occur with  improper specimen collection/handling, submission of specimen other than nasopharyngeal swab, presence of viral mutation(s) within the areas targeted by this assay, and inadequate number of viral copies(<138 copies/mL). A negative result must be combined with clinical observations, patient history, and epidemiological information. The expected result is Negative.  Fact Sheet for Patients:  BloggerCourse.com  Fact Sheet for Healthcare Providers:  SeriousBroker.it  This test is no t yet approved or cleared by the [place]  FDA and  has been authorized for detection and/or diagnosis of SARS-CoV-2 by FDA under an  Emergency Use Authorization (EUA). This EUA will remain  in effect (meaning this test can be used) for the duration of the COVID-19 declaration under Section 564(b)(1) of the Act, 21 U.S.C.section 360bbb-3(b)(1), unless the authorization is terminated  or revoked sooner.       Influenza A by PCR NEGATIVE NEGATIVE Final   Influenza B by PCR NEGATIVE NEGATIVE Final    Comment: (NOTE) The Xpert Xpress SARS-CoV-2/FLU/RSV plus assay  is intended as an aid in the diagnosis of influenza from Nasopharyngeal swab specimens and should not be used as a sole basis for treatment. Nasal washings and aspirates are unacceptable for Xpert Xpress SARS-CoV-2/FLU/RSV testing.  Fact Sheet for Patients: BloggerCourse.com  Fact Sheet for Healthcare Providers: SeriousBroker.it  This test is not yet approved or cleared by the [place]  FDA and has been authorized for detection and/or diagnosis of SARS-CoV-2 by FDA under an Emergency Use Authorization (EUA). This EUA will remain in effect (meaning this test can be used) for the duration of the COVID-19 declaration under Section 564(b)(1) of the Act, 21 U.S.C. section 360bbb-3(b)(1), unless the authorization is terminated or revoked.     Resp Syncytial Virus by PCR NEGATIVE NEGATIVE Final    Comment: (NOTE) Fact Sheet for Patients: BloggerCourse.com  Fact Sheet for Healthcare Providers: SeriousBroker.it  This test is not yet approved or cleared by the [place]  FDA and has been authorized for detection and/or diagnosis of SARS-CoV-2 by FDA under an Emergency Use Authorization (EUA). This EUA will remain in effect (meaning this test can be used) for the duration of the COVID-19 declaration under Section 564(b)(1) of the Act, 21 U.S.C. section 360bbb-3(b)(1), unless the authorization is terminated or revoked.  Performed at [facility], [address] [place], [place] [address]   Gastrointestinal Panel by PCR , Stool     Status: Abnormal   Collection Time: 10/30/23  7:37 PM   Specimen: Stool  Result Value Ref Range Status   Campylobacter species NOT DETECTED NOT DETECTED Final   Plesimonas shigelloides NOT DETECTED NOT DETECTED Final   Salmonella species NOT DETECTED NOT DETECTED Final   Yersinia enterocolitica NOT DETECTED NOT DETECTED Final   Vibrio species NOT DETECTED NOT DETECTED Final   Vibrio cholerae NOT DETECTED NOT DETECTED Final   Enteroaggregative E coli (EAEC) NOT DETECTED NOT DETECTED Final   Enteropathogenic E coli (EPEC) NOT DETECTED NOT DETECTED Final   Enterotoxigenic E coli (ETEC) NOT DETECTED NOT DETECTED Final   Shiga like toxin producing E coli (STEC) NOT DETECTED NOT DETECTED Final   Shigella/Enteroinvasive E coli (EIEC) NOT DETECTED NOT DETECTED Final   Cryptosporidium NOT DETECTED NOT DETECTED Final   Cyclospora cayetanensis NOT DETECTED NOT DETECTED Final   Entamoeba histolytica NOT DETECTED NOT DETECTED Final   Giardia lamblia NOT DETECTED NOT DETECTED Final   Adenovirus F40/41 NOT DETECTED NOT DETECTED Final   Astrovirus NOT DETECTED NOT DETECTED Final   Norovirus GI/GII DETECTED (A) NOT DETECTED Final    Comment: RESULT CALLED TO, READ BACK BY AND VERIFIED WITH: KATHY CHERNESKY AT 2143 ON 10/30/23 BY SS    Rotavirus A NOT DETECTED NOT DETECTED Final   Sapovirus (I, II, IV, and V) NOT DETECTED NOT DETECTED Final    Comment: Performed at [facility], [address] [place], [place] [address]  C Difficile Quick Screen w PCR reflex     Status: None   Collection Time: 10/30/23  7:37 PM   Specimen: STOOL  Result Value Ref Range Status   C Diff antigen NEGATIVE NEGATIVE Final   C Diff toxin NEGATIVE NEGATIVE Final   C Diff interpretation No C. difficile detected.  Final    Comment: VALID Performed at [facility], [address] [place], [place] [address]       Radiological Exams on Admission:   Assessment/Plan Principal Problem:   Acute respiratory failure with hypoxia [facility]) Active Problems:   Acute bronchitis  Acute on chronic diastolic CHF (congestive heart failure) (HCC)   Acute metabolic encephalopathy   UTI (urinary tract infection)   PAF (paroxysmal atrial fibrillation) (HCC)   Depression   Diarrhea   Hypokalemia   Hyponatremia   Benign essential HTN   Obesity (BMI 30-39.9)   Assessment and Plan:  Acute respiratory failure with hypoxia and hypercapnia: Likely due to combination of acute bronchitis and CHF exacerbation.  Patient has cough and  wheezing on auscultation, indicating bronchitis.  Chest x-ray negative for infiltration, does not seem to have pneumonia.  Patient has 1+ leg edema, vascular congestion on chest x-ray, indicating CHF exacerbation.  BNP is 156 which is likely falsely low due to obesity.  Since patient is a DNR. will not consider intubation.  Patient has acute metabolic encephalopathy, cannot use BiPAP or CPAP.  -Admitted to PCU as inpatient - Nasal cannula oxygen to maintain oxygen saturation above 93% - Bronchodilators and Solu-Medrol  for bronchitis - IV Lasix  for CHF exacerbation.  Acute bronchitis: --Bronchodilators and prn Mucinex  - Solu-Medrol  80 mg IV daily after given 125 mg of Solu-Medrol  - IV Rocephin  and azithromycin  (patient received 1 dose of Flagyl  due to diarrhea). - Follow up sputum culture and blood culture  Acute on chronic diastolic CHF (congestive heart failure) (HCC): 2D echo on 01/12/2023 showed EF 50-60% with grade 1 diastolic dysfunction. -IV Lasix  40 mg twice daily -Daily weights -strict I/O's -Low salt diet -Fluid restriction -Patient received 3 L LR in ED due to diarrhea  Acute metabolic encephalopathy: Likely multifactorial etiology.  CT head negative for acute intracranial abnormalities. - Frequent neurocheck - Fall precaution - Hold all home oral medications until  mental status improves  UTI (urinary tract infection) -On Rocephin  - Follow-up urine culture  PAF (paroxysmal atrial fibrillation) (HCC): Heart rate 50-90s.  Patient is not taking anticoagulants currently. -Hold oral metoprolol  - As needed IV metoprolol  2.5 mg every 2 hour for heart rate> 125  Depression: - Hold home BuSpar  and Remeron   Diarrhea: C. difficile negative. -Follow-up GI pathogen panel  Hypokalemia, hypomagnesemia, hypophosphatemia: Potassium 2.6, magnesium  of 1.5, phosphorus 2.0. - Consult pharmacist for electrolytes monitoring - Repleted potassium, phosphorus, magnesium   Hyponatremia: Sodium 128. - Will check urine sodium, urine osmolality, serum osmolality. - Fluid restriction -> NPO now - IVF: pt has received 3 L of LR in ED - Sodium chloride  tablet 1 g twice daily when able to take medication orally - f/u by BMP q8h - avoid over correction too fast due to risk of central pontine myelinolysis - Enalapril  is on hold  Benign essential HTN -IV hydralazine  as needed  Obesity (BMI 30-39.9): - Need to encourage losing weight and taking healthy diet - Need more exercise      DVT ppx: SQ Lovenox   Code Status: DNR   Family Communication: I have tried to call her sister who did not pick up the phone.  I also tried to call her caregiver who did not pick up the phone, I left a message to her caregiver.  Disposition Plan:  Anticipate discharge back to previous environment  Consults called:  none  Admission status and Level of care: Progressive:  inpt        Dispo: The patient is from: Group home ?              Anticipated d/c is to: Group home ?              Anticipated d/c date is: 2 days  Patient currently is not medically stable to d/c.    Severity of Illness:  The appropriate patient status for this patient is INPATIENT. Inpatient status is judged to be reasonable and necessary in order to provide the required intensity of service to  ensure the patient's safety. The patient's presenting symptoms, physical exam findings, and initial radiographic and laboratory data in the context of their chronic comorbidities is felt to place them at high risk for further clinical deterioration. Furthermore, it is not anticipated that the patient will be medically stable for discharge from the hospital within 2 midnights of admission.   * I certify that at the point of admission it is my clinical judgment that the patient will require inpatient hospital care spanning beyond 2 midnights from the point of admission due to high intensity of service, high risk for further deterioration and high frequency of surveillance required.*       Date of Service 10/31/2023    Caleb Exon [facility]   If 7PM-7AM, please contact night-coverage [email or web address] 10/31/2023, 12:00 AM

## 2023-10-30 NOTE — Consult Note (Signed)
 CODE SEPSIS - PHARMACY COMMUNICATION  **Broad Spectrum Antibiotics should be administered within 1 hour of Sepsis diagnosis**  Time Code Sepsis Called/Page Received: 1615  Antibiotics Ordered: ceftriaxone , azithromycin   Time of 1st antibiotic administration: 1656  Additional action taken by pharmacy: n/a  If necessary, Name of Provider/Nurse Contacted: n/a    Virginia Clements ,PharmD Clinical Pharmacist  10/30/2023  5:13 PM

## 2023-10-30 NOTE — ED Notes (Signed)
 MD made aware of critical potassium 2.6 at this time.

## 2023-10-30 NOTE — Sepsis Progress Note (Signed)
 Elink will follow per sepsis protocol.

## 2023-10-30 NOTE — ED Triage Notes (Signed)
 Pt to ED via [facility] from home for AMS. Unknown how long pt has been altered. Unclear what baseline mental status per the people present a the home. Pt reports coughing x 3-4 days. Ems reports possible UTI. Unknown emdical hx. Per EMS pt ws hypoxic at 82% on RA. EMS placed pt on 4L Char[place] and pt SPO2 at 97% at this time.  EMS vitals CBG 115 BP 103/49 HR 73 Temp 98.4

## 2023-10-30 NOTE — ED Provider Notes (Signed)
 [facility] Provider Note   Event Date/Time   First MD Initiated Contact with Patient 10/30/23 1604     (approximate) History  Altered Mental Status  HPI Virginia Clements is a 89 y.o. female with a past medical history of paroxysmal atrial fibrillation, hypertension, CHF, and depression who presents via EMS due to altered mental status.  Patient is a poor historian and unable to answer questions appropriately.  EMS states that family called due to patient not answering questions appropriately and being very lethargic.  Further history and review of systems are unable to be obtained at this time.  Patient placed on 3 L nasal cannula however no reported hypoxia. ROS: Unable to assess   Physical Exam  Triage Vital Signs: ED Triage Vitals  Encounter Vitals Group     BP      Girls Systolic BP Percentile      Girls Diastolic BP Percentile      Boys Systolic BP Percentile      Boys Diastolic BP Percentile      Pulse      Resp      Temp      Temp src      SpO2      Weight      Height      Head Circumference      Peak Flow      Pain Score      Pain Loc      Pain Education      Exclude from Growth Chart    Most recent vital signs: Vitals:   10/30/23 2000 10/30/23 2004  BP: (!) 129/58 (!) 129/58  Pulse: 73 68  Resp: 17 19  Temp:    SpO2: (!) 80% 96%   General: Awake, cooperative CV:  Good peripheral perfusion. Resp:  Increased effort. Abd:  No distention. Other:  Elderly obese Caucasian female resting comfortably in no acute distress ED Results / Procedures / Treatments  Labs (all labs ordered are listed, but only abnormal results are displayed) Labs Reviewed  COMPREHENSIVE METABOLIC PANEL WITH GFR - Abnormal; Notable for the following components:      Result Value   Sodium 128 (*)    Potassium 2.6 (*)    Chloride 84 (*)    Glucose, Bld 124 (*)    Albumin 3.1 (*)    All other components within normal limits  CBC WITH  DIFFERENTIAL/PLATELET - Abnormal; Notable for the following components:   WBC 16.6 (*)    Neutro Abs 13.7 (*)    Abs Immature Granulocytes 0.41 (*)    All other components within normal limits  URINALYSIS, W/ REFLEX TO CULTURE (INFECTION SUSPECTED) - Abnormal; Notable for the following components:   Color, Urine YELLOW (*)    APPearance CLOUDY (*)    Ketones, ur 5 (*)    Leukocytes,Ua SMALL (*)    Bacteria, UA RARE (*)    All other components within normal limits  RESP PANEL BY RT-PCR (RSV, FLU A&B, COVID)  RVPGX2  C DIFFICILE QUICK SCREEN W PCR REFLEX    CULTURE, BLOOD (ROUTINE X 2)  CULTURE, BLOOD (ROUTINE X 2)  GASTROINTESTINAL PANEL BY PCR, STOOL (REPLACES STOOL CULTURE)  URINE CULTURE  EXPECTORATED SPUTUM ASSESSMENT W GRAM STAIN, RFLX TO RESP C  LACTIC ACID, PLASMA  LACTIC ACID, PLASMA  PROTIME-INR  BRAIN NATRIURETIC PEPTIDE  BASIC METABOLIC PANEL WITH GFR  BASIC METABOLIC PANEL WITH GFR  OSMOLALITY  OSMOLALITY, URINE  SODIUM, URINE,  RANDOM  MAGNESIUM   PHOSPHORUS  LEGIONELLA PNEUMOPHILA SEROGP 1 UR AG  STREP PNEUMONIAE URINARY ANTIGEN  CBC   EKG ED ECG REPORT I, Artist MARLA Kerns, the attending physician, personally viewed and interpreted this ECG. Date: 10/30/2023 EKG Time: 1607 Rate: 84 Rhythm: Atrial fibrillation QRS Axis: normal Intervals: normal ST/T Wave abnormalities: normal Narrative Interpretation: Rate controlled atrial fibrillation.  No evidence of acute ischemia RADIOLOGY ED MD interpretation: One-view portable chest x-ray shows cardiomegaly with vascular congestion without any focal consolidation - All radiology independently interpreted and agree with radiology assessment Official radiology report(s): CT Head Wo Contrast Result Date: 10/30/2023 CLINICAL DATA:  Mental status changes EXAM: CT HEAD WITHOUT CONTRAST TECHNIQUE: Contiguous axial images were obtained from the base of the skull through the vertex without intravenous contrast. RADIATION DOSE  REDUCTION: This exam was performed according to the departmental dose-optimization program which includes automated exposure control, adjustment of the mA and/or kV according to patient size and/or use of iterative reconstruction technique. COMPARISON:  07/16/2021 FINDINGS: Brain: There is atrophy and chronic small vessel disease changes. No acute intracranial abnormality. Specifically, no hemorrhage, hydrocephalus, mass lesion, acute infarction, or significant intracranial injury. Vascular: No hyperdense vessel or unexpected calcification. Skull: No acute calvarial abnormality. Sinuses/Orbits: Mucosal thickening in the paranasal sinuses. Air-fluid level in the left sphenoid sinus. Macerated cells clear. Other: None IMPRESSION: Atrophy, chronic microvascular disease. No acute intracranial abnormality. Acute on chronic sinusitis. Electronically Signed   By: Franky Crease M.D.   On: 10/30/2023 19:44   DG Chest Port 1 View Result Date: 10/30/2023 CLINICAL DATA:  Questionable sepsis. EXAM: PORTABLE CHEST 1 VIEW COMPARISON:  Chest radiograph dated 09/16/2023. FINDINGS: Cardiomegaly with vascular congestion. Diffuse interstitial coarsening and chronic bronchitic changes. No focal consolidation, pleural effusion, pneumothorax. Atherosclerotic calcification of the aorta. No acute osseous pathology. IMPRESSION: Cardiomegaly with vascular congestion. No focal consolidation. Electronically Signed   By: Vanetta Chou M.D.   On: 10/30/2023 16:42   PROCEDURES: Critical Care performed: Yes, see critical care procedure note(s) .1-3 Lead EKG Interpretation  Performed by: Kerns Artist MARLA, MD Authorized by: Kerns Artist MARLA, MD     Interpretation: abnormal     ECG rate:  71   ECG rate assessment: normal     Rhythm: atrial fibrillation     Ectopy: none     Conduction: normal    CRITICAL CARE Performed by: Arnel Wymer K Saysha Menta  Total critical care time: 45 minutes  Critical care time was exclusive of separately  billable procedures and treating other patients.  Critical care was necessary to treat or prevent imminent or life-threatening deterioration.  Critical care was time spent personally by me on the following activities: development of treatment plan with patient and/or surrogate as well as nursing, discussions with consultants, evaluation of patient's response to treatment, examination of patient, obtaining history from patient or surrogate, ordering and performing treatments and interventions, ordering and review of laboratory studies, ordering and review of radiographic studies, pulse oximetry and re-evaluation of patient's condition.  MEDICATIONS ORDERED IN ED: Medications  potassium chloride  10 mEq in 100 mL IVPB (10 mEq Intravenous New Bag/Given 10/30/23 2031)  metroNIDAZOLE  (FLAGYL ) IVPB 500 mg (500 mg Intravenous New Bag/Given 10/30/23 2041)  sodium chloride  tablet 1 g (has no administration in time range)  albuterol  (PROVENTIL ) (2.5 MG/3ML) 0.083% nebulizer solution 2.5 mg (has no administration in time range)  dextromethorphan -guaiFENesin  (MUCINEX  DM) 30-600 MG per 12 hr tablet 1 tablet (has no administration in time range)  ondansetron  (ZOFRAN ) injection 4  mg (has no administration in time range)  hydrALAZINE  (APRESOLINE ) injection 5 mg (has no administration in time range)  acetaminophen  (TYLENOL ) tablet 650 mg (has no administration in time range)  enoxaparin  (LOVENOX ) injection 40 mg (has no administration in time range)  lactated ringers  bolus 1,000 mL (0 mLs Intravenous Stopped 10/30/23 1812)    And  lactated ringers  bolus 1,000 mL (0 mLs Intravenous Stopped 10/30/23 1916)    And  lactated ringers  bolus 1,000 mL (1,000 mLs Intravenous New Bag/Given 10/30/23 2024)  cefTRIAXone  (ROCEPHIN ) 2 g in sodium chloride  0.9 % 100 mL IVPB (0 g Intravenous Stopped 10/30/23 1743)  azithromycin  (ZITHROMAX ) 500 mg in sodium chloride  0.9 % 250 mL IVPB (0 mg Intravenous Stopped 10/30/23 1916)  potassium  chloride (KLOR-CON ) packet 40 mEq (40 mEq Oral Given 10/30/23 1810)   IMPRESSION / MDM / ASSESSMENT AND PLAN / ED COURSE  I reviewed the triage vital signs and the nursing notes.                             The patient is on the cardiac monitor to evaluate for evidence of arrhythmia and/or significant heart rate changes. Patient's presentation is most consistent with acute presentation with potential threat to life or bodily function. The Pt presents with hypoxia, cough, diarrhea highly concerning for sepsis (suspected GI/pulmonary source). At this time, the Pt is satting well on 4 L, normotensive, and appears HDS.  Will start empiric antibiotics and fluids.  Due to signs of infection, will administer fluids gradually with frequent reassessment. Have low suspicion for a GI, skin/soft tissue, or CNS source at this time, but will reconsider if initial workup is unremarkable.  - CBC, BMP, LFTs - VBG - UA - BCx x2, Lactate - EKG - CXR - CT head did not show any evidence of acute abnormalities - Empiric Abx: Rocephin , azithromycin , metronidazole  - Fluids: 30cc/kgLR Dispo: Admit to medicine   FINAL CLINICAL IMPRESSION(S) / ED DIAGNOSES   Final diagnoses:  Altered mental status, unspecified altered mental status type  Diarrhea of presumed infectious origin  Hypokalemia  Hyponatremia   Rx / DC Orders   ED Discharge Orders     None      Note:  This document was prepared using Dragon voice recognition software and may include unintentional dictation errors.   Jossie Artist POUR, MD 10/30/23 2118

## 2023-10-31 ENCOUNTER — Inpatient Hospital Stay

## 2023-10-31 DIAGNOSIS — J9601 Acute respiratory failure with hypoxia: Secondary | ICD-10-CM

## 2023-10-31 DIAGNOSIS — R7301 Impaired fasting glucose: Secondary | ICD-10-CM | POA: Insufficient documentation

## 2023-10-31 DIAGNOSIS — J441 Chronic obstructive pulmonary disease with (acute) exacerbation: Secondary | ICD-10-CM | POA: Diagnosis not present

## 2023-10-31 DIAGNOSIS — G9341 Metabolic encephalopathy: Secondary | ICD-10-CM | POA: Diagnosis not present

## 2023-10-31 DIAGNOSIS — N390 Urinary tract infection, site not specified: Secondary | ICD-10-CM

## 2023-10-31 DIAGNOSIS — A0811 Acute gastroenteropathy due to Norwalk agent: Secondary | ICD-10-CM | POA: Insufficient documentation

## 2023-10-31 DIAGNOSIS — R319 Hematuria, unspecified: Secondary | ICD-10-CM

## 2023-10-31 DIAGNOSIS — I5033 Acute on chronic diastolic (congestive) heart failure: Secondary | ICD-10-CM

## 2023-10-31 DIAGNOSIS — J9602 Acute respiratory failure with hypercapnia: Secondary | ICD-10-CM

## 2023-10-31 LAB — GLUCOSE, CAPILLARY
Glucose-Capillary: 147 mg/dL — ABNORMAL HIGH (ref 70–99)
Glucose-Capillary: 151 mg/dL — ABNORMAL HIGH (ref 70–99)

## 2023-10-31 LAB — BASIC METABOLIC PANEL WITH GFR
Anion gap: 11 (ref 5–15)
Anion gap: 9 (ref 5–15)
Anion gap: 11 (ref 5–15)
Anion gap: 9 (ref 5–15)
BUN: 6 mg/dL — ABNORMAL LOW (ref 8–23)
BUN: 6 mg/dL — ABNORMAL LOW (ref 8–23)
BUN: 6 mg/dL — ABNORMAL LOW (ref 8–23)
BUN: 6 mg/dL — ABNORMAL LOW (ref 8–23)
CO2: 30 mmol/L (ref 22–32)
CO2: 34 mmol/L — ABNORMAL HIGH (ref 22–32)
CO2: 33 mmol/L — ABNORMAL HIGH (ref 22–32)
CO2: 35 mmol/L — ABNORMAL HIGH (ref 22–32)
Calcium: 8.8 mg/dL — ABNORMAL LOW (ref 8.9–10.3)
Calcium: 9.3 mg/dL (ref 8.9–10.3)
Calcium: 9.3 mg/dL (ref 8.9–10.3)
Calcium: 9.5 mg/dL (ref 8.9–10.3)
Chloride: 86 mmol/L — ABNORMAL LOW (ref 98–111)
Chloride: 91 mmol/L — ABNORMAL LOW (ref 98–111)
Chloride: 90 mmol/L — ABNORMAL LOW (ref 98–111)
Chloride: 91 mmol/L — ABNORMAL LOW (ref 98–111)
Creatinine, Ser: 0.47 mg/dL (ref 0.44–1.00)
Creatinine, Ser: 0.59 mg/dL (ref 0.44–1.00)
Creatinine, Ser: 0.51 mg/dL (ref 0.44–1.00)
Creatinine, Ser: 0.59 mg/dL (ref 0.44–1.00)
GFR, Estimated: >60 mL/min (ref >60)
GFR, Estimated: >60 mL/min (ref >60)
GFR, Estimated: >60 mL/min (ref >60)
GFR, Estimated: >60 mL/min (ref >60)
Glucose, Bld: 274 mg/dL — ABNORMAL HIGH (ref 70–99)
Glucose, Bld: 175 mg/dL — ABNORMAL HIGH (ref 70–99)
Glucose, Bld: 146 mg/dL — ABNORMAL HIGH (ref 70–99)
Glucose, Bld: 155 mg/dL — ABNORMAL HIGH (ref 70–99)
Potassium: 6.4 mmol/L (ref 3.5–5.1)
Potassium: 3.6 mmol/L (ref 3.5–5.1)
Potassium: 3.8 mmol/L (ref 3.5–5.1)
Potassium: 3.7 mmol/L (ref 3.5–5.1)
Sodium: 127 mmol/L — ABNORMAL LOW (ref 135–145)
Sodium: 133 mmol/L — ABNORMAL LOW (ref 135–145)
Sodium: 134 mmol/L — ABNORMAL LOW (ref 135–145)
Sodium: 135 mmol/L (ref 135–145)

## 2023-10-31 LAB — CBC
HCT: 38.5 % (ref 36.0–46.0)
Hemoglobin: 12.8 g/dL (ref 12.0–15.0)
MCH: 31.6 pg (ref 26.0–34.0)
MCHC: 33.2 g/dL (ref 30.0–36.0)
MCV: 95.1 fL (ref 80.0–100.0)
Platelets: 296 K/uL (ref 150–400)
RBC: 4.05 MIL/uL (ref 3.87–5.11)
RDW: 13.6 % (ref 11.5–15.5)
WBC: 14.4 K/uL — ABNORMAL HIGH (ref 4.0–10.5)
nRBC: 0 % (ref 0.0–0.2)

## 2023-10-31 LAB — HEMOGLOBIN A1C
Hgb A1c MFr Bld: 5.1 % (ref 4.8–5.6)
Mean Plasma Glucose: 99.67 mg/dL

## 2023-10-31 LAB — STREP PNEUMONIAE URINARY ANTIGEN: Strep Pneumo Urinary Antigen: NEGATIVE

## 2023-10-31 LAB — PHOSPHORUS: Phosphorus: 3.4 mg/dL (ref 2.5–4.6)

## 2023-10-31 LAB — MAGNESIUM: Magnesium: 2 mg/dL (ref 1.7–2.4)

## 2023-10-31 MED ORDER — DEXTROSE 50 % IV SOLN
1.0000 | Freq: Once | INTRAVENOUS | Status: AC
  Administered 2023-10-31: 50 mL via INTRAVENOUS
  Filled 2023-10-31: qty 50

## 2023-10-31 MED ORDER — INSULIN ASPART 100 UNIT/ML IV SOLN
10.0000 [IU] | Freq: Once | INTRAVENOUS | Status: AC
  Administered 2023-10-31: 10 [IU] via INTRAVENOUS
  Filled 2023-10-31: qty 0.1

## 2023-10-31 MED ORDER — CALCIUM GLUCONATE-NACL 1-0.675 GM/50ML-% IV SOLN
1.0000 g | Freq: Once | INTRAVENOUS | Status: AC
  Administered 2023-10-31: 1000 mg via INTRAVENOUS
  Filled 2023-10-31: qty 50

## 2023-10-31 MED ORDER — SODIUM BICARBONATE 8.4 % IV SOLN
50.0000 meq | Freq: Once | INTRAVENOUS | Status: AC
  Administered 2023-10-31: 50 meq via INTRAVENOUS
  Filled 2023-10-31: qty 50

## 2023-10-31 MED ORDER — SODIUM ZIRCONIUM CYCLOSILICATE 10 G PO PACK
10.0000 g | PACK | Freq: Once | ORAL | Status: DC
  Filled 2023-10-31: qty 1

## 2023-10-31 MED ORDER — POTASSIUM CHLORIDE 20 MEQ PO PACK
20.0000 meq | PACK | Freq: Once | ORAL | Status: DC
Start: 2023-10-31 — End: 2023-10-31

## 2023-10-31 MED ORDER — DEXTROSE-SODIUM CHLORIDE 5-0.9 % IV SOLN: INTRAVENOUS | Status: AC

## 2023-10-31 MED ORDER — ALBUTEROL SULFATE (2.5 MG/3ML) 0.083% IN NEBU
2.5000 mg | INHALATION_SOLUTION | Freq: Once | RESPIRATORY_TRACT | Status: AC
  Administered 2023-10-31: 2.5 mg via RESPIRATORY_TRACT
  Filled 2023-10-31: qty 3

## 2023-10-31 MED ORDER — INSULIN ASPART 100 UNIT/ML IJ SOLN: 0.0000 [IU] | Freq: Every day | INTRAMUSCULAR | Status: DC

## 2023-10-31 MED ORDER — POTASSIUM CHLORIDE 10 MEQ/100ML IV SOLN
10.0000 meq | INTRAVENOUS | Status: AC
  Administered 2023-10-31: 10 meq via INTRAVENOUS
  Filled 2023-10-31: qty 100

## 2023-10-31 MED ORDER — INSULIN ASPART 100 UNIT/ML IJ SOLN: 0.0000 [IU] | Freq: Three times a day (TID) | INTRAMUSCULAR | Status: DC

## 2023-10-31 NOTE — Assessment & Plan Note (Signed)
 Replaced

## 2023-10-31 NOTE — Assessment & Plan Note (Addendum)
 Supportive care.  With stage VII diarrhea today I will give a dose of Imodium  and monitor.  I am hesitant on sending patient home in the care of her sister with a stage VII diarrhea today.

## 2023-10-31 NOTE — Progress Notes (Signed)
 Progress Note   Patient: Virginia Clements FMW:[id number] DOB: 03-06-1930 DOA: 10/30/2023     1 DOS: the patient was seen and examined on 10/31/2023   Brief hospital course: 89 y.o. female with medical history significant of wheelchair-bound, HFpEF, PAF not on AC, HTN, breast cancer status post radiation therapy, depression , who presents with cough, diarrhea, AMS.   Patient has AMS,  and is unable to provide any medical history, therefore, most of the history is obtained by discussing the case with ED physician, per EMS report, and with the nursing staff.   Per report, pt has cough in the past several days.  No fever.  Her temperature is 98.4 in ED. Chart review revealed that patient is not using oxygen and is oriented x 3 at her normal baseline.  She was found to have moderate acute respiratory distress.  He has oxygen desaturation to 80-82% on room air, which improved to 96% on 4L oxygen.  Not sure if patient has any chest pain.  She has mild wheezing on auscultation by my examination.  She has altered mental status, not arousable, not following command.  She moves all extremities on painful stimuli.  No facial droop noted.  Per report, patient has several episode of diarrhea.  No active nausea vomiting noted.  Does not seem to have abdominal tenderness on examination.  Not sure if patient has symptoms of UTI.   Data reviewed independently and ED Course: pt was found to have WBC 16.6, lactic acid 1.0 --> 1.0, negative PCR for COVID, flu and RSV, UA (cloudy appearance, small amount of leukocyte, rare bacteria, WBC> 50, squamous cell 6-10), sodium 128, potassium 2.6, GFR> 60, negative C. difficile test. Temperature normal, blood pressure 129/58, heart rate 50-90s, RR 24 --> 19.  VBG with pH 7.3, CO2 70, O2 44.  Chest x-ray showed cardiomegaly, vascular congestion without infiltration.  CT of head negative for acute intracranial abnormalities.  Patient is admitted to PCU as inpatient.  7/30.   Patient unresponsive to sternal rub.  Stool culture positive for norovirus.  Assessment and Plan: * Acute metabolic encephalopathy Patient unresponsive to sternal rub.  CT scan of the head negative.  MRI of the brain ordered this morning and that is negative.  Could be secondary to urinary tract infection or norovirus infection.  Continue to monitor closely.  Acute respiratory failure with hypoxia and hypercapnia (HCC) PCO2 of 70 on venous blood gas.  Looks like previously been higher.  Patient currently on 4 L of oxygen  COPD with acute exacerbation (HCC) Patient started on Solu-Medrol  and nebulizer treatments and empiric antibiotics.  Acute on chronic diastolic CHF (congestive heart failure) (HCC) Patient started on IV Lasix .  PAF (paroxysmal atrial fibrillation) (HCC) Brief episode of atrial fibrillation yesterday.  UTI (urinary tract infection) Follow-up urine culture.  Empirically on Rocephin .  Hyponatremia Salt tablets if able to take  Hypokalemia Potassium replacement.  1 episode of hyperkalemia but was receiving K-Phos at that time likely false positive.  Obesity (BMI 30-39.9) BMI 33.27  Impaired fasting glucose Checking hemoglobin A1c.  Very sensitive sliding scale.  Infection due to Norovirus species Supportive care        Subjective: Patient unresponsive to sternal rub brought in with altered mental status  Physical Exam: Vitals:   10/31/23 1047 10/31/23 1100 10/31/23 1130 10/31/23 1239  BP:  127/69  (!) 115/50  Pulse:  75 69 (!) 58  Resp:  20 20   Temp: (!) 97.3 F (36.3 C)  TempSrc: Axillary     SpO2:  92% 97% 97%  Weight:      Height:       Physical Exam HENT:     Head: Normocephalic.  Eyes:     General: Lids are normal.  Cardiovascular:     Rate and Rhythm: Normal rate and regular rhythm.     Heart sounds: Normal heart sounds, S1 normal and S2 normal.  Pulmonary:     Breath sounds: Examination of the right-lower field reveals  decreased breath sounds. Examination of the left-lower field reveals decreased breath sounds. Decreased breath sounds present. No wheezing, rhonchi or rales.  Abdominal:     Palpations: Abdomen is soft.     Tenderness: There is no abdominal tenderness.  Musculoskeletal:     Right lower leg: No swelling.     Left lower leg: No swelling.  Skin:    General: Skin is warm.     Findings: No rash.  Neurological:     Mental Status: She is unresponsive.     Data Reviewed: White blood cell count 14.4, hemoglobin 12.8, platelet count 296, venous blood gas reviewed with PCO2 of 70, this morning's potassium 6.4 but repeat 3.6, sodium 133, creatinine 0.59, glucose 175 Family Communication: Spoke with sister on the phone and her husband  Disposition: Status is: Inpatient Remains inpatient appropriate because: Patient this morning unresponsive to sternal rub  Planned Discharge Destination: Home    Time spent: 28 minutes  Author: Charlie Patterson, MD 10/31/2023 12:44 PM  For on call review [email or web address].

## 2023-10-31 NOTE — Plan of Care (Signed)
  Problem: Education: Goal: Knowledge of General Education information will improve Description: Including pain rating scale, medication(s)/side effects and non-pharmacologic comfort measures Outcome: Not Progressing   Problem: Health Behavior/Discharge Planning: Goal: Ability to manage health-related needs will improve Outcome: Not Progressing   Problem: Clinical Measurements: Goal: Ability to maintain clinical measurements within normal limits will improve Outcome: Not Progressing Goal: Will remain free from infection Outcome: Not Progressing Goal: Diagnostic test results will improve Outcome: Not Progressing Goal: Respiratory complications will improve Outcome: Not Progressing Goal: Cardiovascular complication will be avoided Outcome: Not Progressing   Problem: Activity: Goal: Risk for activity intolerance will decrease Outcome: Not Progressing   Problem: Nutrition: Goal: Adequate nutrition will be maintained Outcome: Not Progressing   Problem: Coping: Goal: Level of anxiety will decrease Outcome: Not Progressing   Problem: Elimination: Goal: Will not experience complications related to bowel motility Outcome: Not Progressing Goal: Will not experience complications related to urinary retention Outcome: Not Progressing   Problem: Pain Managment: Goal: General experience of comfort will improve and/or be controlled Outcome: Not Progressing   Problem: Safety: Goal: Ability to remain free from injury will improve Outcome: Not Progressing   Problem: Skin Integrity: Goal: Risk for impaired skin integrity will decrease Outcome: Not Progressing   Problem: Education: Goal: Ability to demonstrate management of disease process will improve Outcome: Not Progressing Goal: Ability to verbalize understanding of medication therapies will improve Outcome: Not Progressing Goal: Individualized Educational Video(s) Outcome: Not Progressing   Problem: Activity: Goal:  Capacity to carry out activities will improve Outcome: Not Progressing   Problem: Cardiac: Goal: Ability to achieve and maintain adequate cardiopulmonary perfusion will improve Outcome: Not Progressing   Problem: Activity: Goal: Ability to tolerate increased activity will improve Outcome: Not Progressing   Problem: Clinical Measurements: Goal: Ability to maintain a body temperature in the normal range will improve Outcome: Not Progressing   Problem: Respiratory: Goal: Ability to maintain adequate ventilation will improve Outcome: Not Progressing Goal: Ability to maintain a clear airway will improve Outcome: Not Progressing   Problem: Education: Goal: Ability to describe self-care measures that may prevent or decrease complications (Diabetes Survival Skills Education) will improve Outcome: Not Progressing Goal: Individualized Educational Video(s) Outcome: Not Progressing   Problem: Coping: Goal: Ability to adjust to condition or change in health will improve Outcome: Not Progressing   Problem: Fluid Volume: Goal: Ability to maintain a balanced intake and output will improve Outcome: Not Progressing   Problem: Health Behavior/Discharge Planning: Goal: Ability to identify and utilize available resources and services will improve Outcome: Not Progressing Goal: Ability to manage health-related needs will improve Outcome: Not Progressing   Problem: Metabolic: Goal: Ability to maintain appropriate glucose levels will improve Outcome: Not Progressing   Problem: Nutritional: Goal: Maintenance of adequate nutrition will improve Outcome: Not Progressing Goal: Progress toward achieving an optimal weight will improve Outcome: Not Progressing   Problem: Skin Integrity: Goal: Risk for impaired skin integrity will decrease Outcome: Not Progressing   Problem: Tissue Perfusion: Goal: Adequacy of tissue perfusion will improve Outcome: Not Progressing

## 2023-10-31 NOTE — ED Notes (Signed)
 Pt transported to MRI at this time

## 2023-10-31 NOTE — ED Notes (Addendum)
 Orders for PO K+ at this time, notified Dr. Josette and [facility], pharmacy at this time that pt continues to be lethargic and responsive to painful stimuli. VS are stable

## 2023-10-31 NOTE — ED Notes (Signed)
 Secure chat hollace Peaches, MD regarding critical potassium. Awaiting response. Patient is currently on CCM.

## 2023-10-31 NOTE — ED Notes (Signed)
 Labs drawn. Patient resting comfortably with eyes closed, even rise and fall of chest noted. VSS, CCM in use, call light within reach. No other needs identified at this time.

## 2023-10-31 NOTE — Hospital Course (Addendum)
 89 y.o. female with medical history significant of wheelchair-bound, HFpEF, PAF not on AC, HTN, breast cancer status post radiation therapy, depression , who presents with cough, diarrhea, AMS.   Patient has AMS,  and is unable to provide any medical history, therefore, most of the history is obtained by discussing the case with ED physician, per EMS report, and with the nursing staff.   Per report, pt has cough in the past several days.  No fever.  Her temperature is 98.4 in ED. Chart review revealed that patient is not using oxygen and is oriented x 3 at her normal baseline.  She was found to have moderate acute respiratory distress.  He has oxygen desaturation to 80-82% on room air, which improved to 96% on 4L oxygen.  Not sure if patient has any chest pain.  She has mild wheezing on auscultation by my examination.  She has altered mental status, not arousable, not following command.  She moves all extremities on painful stimuli.  No facial droop noted.  Per report, patient has several episode of diarrhea.  No active nausea vomiting noted.  Does not seem to have abdominal tenderness on examination.  Not sure if patient has symptoms of UTI.   Data reviewed independently and ED Course: pt was found to have WBC 16.6, lactic acid 1.0 --> 1.0, negative PCR for COVID, flu and RSV, UA (cloudy appearance, small amount of leukocyte, rare bacteria, WBC> 50, squamous cell 6-10), sodium 128, potassium 2.6, GFR> 60, negative C. difficile test. Temperature normal, blood pressure 129/58, heart rate 50-90s, RR 24 --> 19.  VBG with pH 7.3, CO2 70, O2 44.  Chest x-ray showed cardiomegaly, vascular congestion without infiltration.  CT of head negative for acute intracranial abnormalities.  Patient is admitted to PCU as inpatient.  7/30.  Patient unresponsive to sternal rub.  Stool culture positive for norovirus. 7/31.  VBG shows a PCO2 of 90.  Started on BiPAP.  Patient able to answer some questions in the afternoon.   Repeat ABG showed a PCO2 of 82. 8/1.  pCO2 of 78 on VBG this morning.  Patient still not responsive enough to pass swallow evaluation.  Antibiotics changed over to Zosyn  with ESBL Klebsiella growing out of urine culture.  Patient will need intermittent BiPAP during the day and BiPAP at night. 8/2.  Mental status much improved from the last 3 days.  Continue Zosyn .  Continue BiPAP at night.  Replace phosphorus. 8/3.  Need to set up BiPAP for home prior to disposition.  Continue Zosyn .  Encourage eating.  Replace phosphorus. 8/4.  Trying to set up noninvasive ventilation for home use.  Will continue IV Zosyn  today and switch over to Bactrim  for tomorrow. 8/5.  Patient has stage VII diarrhea today.  Will give a dose of Imodium .  Hesitant on sending patient home since she is bedbound in the care of her sister with a stage VII diarrhea this morning 8/6.  Patient will be discharged home today

## 2023-10-31 NOTE — Consult Note (Addendum)
 PHARMACY CONSULT NOTE - ELECTROLYTES  Pharmacy Consult for Electrolyte Monitoring and Replacement   Recent Labs: Height: 5' 5 (165.1 cm) Weight: 90.7 kg (199 lb 15.3 oz) IBW/kg (Calculated) : 57 Estimated Creatinine Clearance: 51 mL/min (by C-G formula based on SCr of 0.59 mg/dL). Potassium (mmol/L)  Date Value  10/31/2023 3.6  08/14/2011 4.1   Magnesium  (mg/dL)  Date Value  92/69/7974 2.0   Calcium  (mg/dL)  Date Value  92/69/7974 9.3   Calcium , Total (mg/dL)  Date Value  94/86/7986 9.4   Albumin (g/dL)  Date Value  92/70/7974 3.1 (L)  08/10/2011 3.9   Phosphorus (mg/dL)  Date Value  92/69/7974 3.4   Sodium (mmol/L)  Date Value  10/31/2023 133 (L)  08/14/2011 138    Assessment  Virginia Clements is a 89 y.o. female presenting with cough, diarrhea, and AMS. PMH significant for HFpEF, PAF(not on AC), HTN, breast cancer s/p radiation, depression. Pharmacy has been consulted to monitor and replace electrolytes.  Diet: dysphagia 2  MIVF: N/A Pertinent medications: NaCl 1g PO tablet twice daily  Goal of Therapy: Electrolytes WNL  Plan:  K 3.6: Kcl 10mEq IV x 2 Na 133: continue NaCl 1g BID tablets Check BMP, Mg, Phos with AM labs  Thank you for allowing pharmacy to be a part of this patient's care.  Sundeep Cary A Jacqulin Brandenburger, PharmD Clinical Pharmacist 10/31/2023 10:20 AM

## 2023-10-31 NOTE — Assessment & Plan Note (Signed)
 PCO2 of 90 on repeat venous blood gas.  BiPAP ordered.  Patient answering some questions this afternoon.

## 2023-10-31 NOTE — Assessment & Plan Note (Signed)
 Last sodium normal range

## 2023-10-31 NOTE — Assessment & Plan Note (Addendum)
 Class I obesity BMI 30.85

## 2023-10-31 NOTE — Assessment & Plan Note (Addendum)
 Will restart Lasix  40 mg daily and metoprolol 

## 2023-10-31 NOTE — ED Notes (Addendum)
 1106 Attempted to call back Dianna, pt's caregiver. Attempted unsuccessful, left a HIPAA compliant VM at this time.   1136 spoke with Dianna at this time, updated caregiver at this time and informed caregiver of pt's status. Caregiver also states that they want to make sure pt comes back home and not a SNF.

## 2023-10-31 NOTE — ED Notes (Signed)
 Unable to give Lokelma  to patient at this time due to mental status. Patient somnolent, VSS, CCM in use, call light within reach. No other needs identified at this time.

## 2023-10-31 NOTE — ED Notes (Addendum)
 This RN checked pt brief, pt clean and dry at this time.

## 2023-10-31 NOTE — Assessment & Plan Note (Addendum)
 Brief episode of atrial fibrillation.  Restart metoprolol 

## 2023-10-31 NOTE — Assessment & Plan Note (Addendum)
 Changed antibiotics over to Zosyn  on 8/1.  This bacteria is resistant to most antibiotics.

## 2023-10-31 NOTE — Assessment & Plan Note (Addendum)
 Patient started on Solu-Medrol  and nebulizer treatments and empiric antibiotics.  Will get rid of Solu-Medrol  after today's dose.

## 2023-10-31 NOTE — Assessment & Plan Note (Addendum)
 Mental status much improved today on 8/2.  Likely with changing antibiotics over to Zosyn  for resistant Klebsiella ESBL urinary infection helped.  CT scan of the head negative.  MRI of the brain is negative.

## 2023-10-31 NOTE — Assessment & Plan Note (Addendum)
 Last hemoglobin A1c 5.1

## 2023-11-01 DIAGNOSIS — J9621 Acute and chronic respiratory failure with hypoxia: Secondary | ICD-10-CM

## 2023-11-01 DIAGNOSIS — G9341 Metabolic encephalopathy: Secondary | ICD-10-CM | POA: Diagnosis not present

## 2023-11-01 DIAGNOSIS — J9601 Acute respiratory failure with hypoxia: Secondary | ICD-10-CM | POA: Diagnosis not present

## 2023-11-01 DIAGNOSIS — J441 Chronic obstructive pulmonary disease with (acute) exacerbation: Secondary | ICD-10-CM | POA: Diagnosis not present

## 2023-11-01 DIAGNOSIS — N3001 Acute cystitis with hematuria: Secondary | ICD-10-CM

## 2023-11-01 DIAGNOSIS — I959 Hypotension, unspecified: Secondary | ICD-10-CM | POA: Insufficient documentation

## 2023-11-01 DIAGNOSIS — I5033 Acute on chronic diastolic (congestive) heart failure: Secondary | ICD-10-CM | POA: Diagnosis not present

## 2023-11-01 LAB — BLOOD GAS, VENOUS
Acid-Base Excess: 11.3 mmol/L — ABNORMAL HIGH (ref 0.0–2.0)
Bicarbonate: 42.6 mmol/L — ABNORMAL HIGH (ref 20.0–28.0)
O2 Saturation: 58.7 %
Patient temperature: 37
pCO2, Ven: 95 mmHg (ref 44–60)
pH, Ven: 7.26 (ref 7.25–7.43)
pO2, Ven: 32 mmHg (ref 32–45)

## 2023-11-01 LAB — MAGNESIUM: Magnesium: 2.2 mg/dL (ref 1.7–2.4)

## 2023-11-01 LAB — BLOOD GAS, ARTERIAL
Acid-Base Excess: 10.4 mmol/L — ABNORMAL HIGH (ref 0.0–2.0)
Bicarbonate: 40.3 mmol/L — ABNORMAL HIGH (ref 20.0–28.0)
Delivery systems: POSITIVE
Expiratory PAP: 8 cmH2O
FIO2: 30 %
Inspiratory PAP: 14 cmH2O
Mechanical Rate: 12
O2 Saturation: 100 %
Patient temperature: 37
pCO2 arterial: 82 mmHg (ref 32–48)
pH, Arterial: 7.3 — ABNORMAL LOW (ref 7.35–7.45)
pO2, Arterial: 85 mmHg (ref 83–108)

## 2023-11-01 LAB — GLUCOSE, CAPILLARY
Glucose-Capillary: 136 mg/dL — ABNORMAL HIGH (ref 70–99)
Glucose-Capillary: 147 mg/dL — ABNORMAL HIGH (ref 70–99)
Glucose-Capillary: 145 mg/dL — ABNORMAL HIGH (ref 70–99)
Glucose-Capillary: 130 mg/dL — ABNORMAL HIGH (ref 70–99)

## 2023-11-01 LAB — PHOSPHORUS: Phosphorus: 2.9 mg/dL (ref 2.5–4.6)

## 2023-11-01 MED ORDER — SODIUM CHLORIDE 0.9 % IV BOLUS
500.0000 mL | Freq: Once | INTRAVENOUS | Status: AC
  Administered 2023-11-01: 500 mL via INTRAVENOUS

## 2023-11-01 NOTE — Assessment & Plan Note (Addendum)
 PCO2 of 78 on repeat venous blood gas.  Continue BiPAP at night.  Without the use of the ventilator the patient's condition will quickly deteriorate. Removal of the ventilator may cause serious harm to the patient, exacerbation of condition and hospital readmission. Bilevel/RAD with backup rate has been tried and failed to maintain or stabilize the patient. Bilevel does not meet current volume requirements. pCO2 from ABG results on Bilevel are still elevated. Patient requires frequent durations of ventilatory support. Intermittent usage is insufficient.   Chronic oxygen 2 L set up upon discharge along with noninvasive ventilation at night.

## 2023-11-01 NOTE — Progress Notes (Signed)
 SLP Cancellation Note  Patient Details Name: Virginia Clements MRN: [id number] DOB: 1929-09-18   Cancelled treatment:       Reason Eval/Treat Not Completed: Medical issues which prohibited therapy  Pt with CO2 of 95, with plan to start BiPAP. Pt is not appropriate for bedside swallow evaluation at this time. SLP will follow to determine readiness for PO intake. RN aware of plan.   [place] Roberth Berling Clapp, MS, CCC-SLP Speech Language Pathologist Rehab Services; [facility] [phone] (ascom)     [place] J Clapp 11/01/2023, 9:47 AM

## 2023-11-01 NOTE — Progress Notes (Signed)
   11/01/23 1206  Assess: MEWS Score  BP (!) 79/50  MAP (mmHg) (!) 61  Pulse Rate (!) 50  ECG Heart Rate 91  Resp 17  SpO2 97 %  O2 Device Bi-PAP  Assess: MEWS Score  MEWS Temp 0  MEWS Systolic 2  MEWS Pulse 0  MEWS RR 0  MEWS LOC 1  MEWS Score 3  MEWS Score Color Yellow  Assess: if the MEWS score is Yellow or Red  Were vital signs accurate and taken at a resting state? Yes  MEWS guidelines implemented  Yes, yellow  Treat  MEWS Interventions Considered administering scheduled or prn medications/treatments as ordered  Take Vital Signs  Increase Vital Sign Frequency  Yellow: Q2hr x1, continue Q4hrs until patient remains green for 12hrs  Escalate  MEWS: Escalate Yellow: Discuss with charge nurse and consider notifying provider and/or RRT  Notify: Charge Nurse/RN  Name of Charge Nurse/RN Notified John  Provider Notification  Provider Name/Title Wieting  Date Provider Notified 11/01/23  Time Provider Notified 1220  Method of Notification Page  Assess: SIRS CRITERIA  SIRS Temperature  0  SIRS Respirations  0  SIRS Pulse 1  SIRS WBC 0  SIRS Score Sum  1

## 2023-11-01 NOTE — Assessment & Plan Note (Addendum)
 Resolved

## 2023-11-01 NOTE — Progress Notes (Signed)
 Sister called to express that they do not want the patient to go to a nursing home.  The sister says the patient has 24 hour care at home.

## 2023-11-01 NOTE — Care Management Important Message (Signed)
 Important Message  Patient Details  Name: Virginia Clements MRN: [id number] Date of Birth: 27-Feb-1930   Important Message Given:  Yes - Medicare IM     Rojelio [name] 11/01/2023, 2:37 PM

## 2023-11-01 NOTE — Progress Notes (Addendum)
 Progress Note   Patient: Virginia Clements FMW:[id number] DOB: 1929/05/08 DOA: 10/30/2023     2 DOS: the patient was seen and examined on 11/01/2023   Brief hospital course: 89 y.o. female with medical history significant of wheelchair-bound, HFpEF, PAF not on AC, HTN, breast cancer status post radiation therapy, depression , who presents with cough, diarrhea, AMS.   Patient has AMS,  and is unable to provide any medical history, therefore, most of the history is obtained by discussing the case with ED physician, per EMS report, and with the nursing staff.   Per report, pt has cough in the past several days.  No fever.  Her temperature is 98.4 in ED. Chart review revealed that patient is not using oxygen and is oriented x 3 at her normal baseline.  She was found to have moderate acute respiratory distress.  He has oxygen desaturation to 80-82% on room air, which improved to 96% on 4L oxygen.  Not sure if patient has any chest pain.  She has mild wheezing on auscultation by my examination.  She has altered mental status, not arousable, not following command.  She moves all extremities on painful stimuli.  No facial droop noted.  Per report, patient has several episode of diarrhea.  No active nausea vomiting noted.  Does not seem to have abdominal tenderness on examination.  Not sure if patient has symptoms of UTI.   Data reviewed independently and ED Course: pt was found to have WBC 16.6, lactic acid 1.0 --> 1.0, negative PCR for COVID, flu and RSV, UA (cloudy appearance, small amount of leukocyte, rare bacteria, WBC> 50, squamous cell 6-10), sodium 128, potassium 2.6, GFR> 60, negative C. difficile test. Temperature normal, blood pressure 129/58, heart rate 50-90s, RR 24 --> 19.  VBG with pH 7.3, CO2 70, O2 44.  Chest x-ray showed cardiomegaly, vascular congestion without infiltration.  CT of head negative for acute intracranial abnormalities.  Patient is admitted to PCU as inpatient.  7/30.   Patient unresponsive to sternal rub.  Stool culture positive for norovirus. 7/31.  VBG shows a PCO2 of 90.  Started on BiPAP.  Patient able to answer some questions in the afternoon.  Assessment and Plan: * Acute metabolic encephalopathy CT scan of the head negative.  MRI of the brain is negative.  Could be secondary to urinary tract infection or norovirus infection and or CO2 retention.  BiPAP started.  Patient answering some questions this afternoon.  Acute on chronic respiratory failure with hypoxia and hypercapnia (HCC) PCO2 of 90 on repeat venous blood gas.  BiPAP ordered.  Patient answering some questions this afternoon.  COPD with acute exacerbation (HCC) Patient started on Solu-Medrol  and nebulizer treatments and empiric antibiotics.  Acute on chronic diastolic CHF (congestive heart failure) (HCC) Since n.p.o. with altered mental status will hold on Lasix  for right now.  PAF (paroxysmal atrial fibrillation) (HCC) Brief episode of atrial fibrillation.  UTI (urinary tract infection) Gram-negative rods on culture.  Empirically on Rocephin .  Hyponatremia Last sodium normal range  Hypokalemia Replaced  Obesity (BMI 30-39.9) BMI 33.27  Hypotension Fluid bolus  Impaired fasting glucose Checking hemoglobin A1c.  Very sensitive sliding scale.  Infection due to Norovirus species Supportive care        Subjective: Patient again with minimal responsiveness this morning.  After starting BiPAP able to answer some questions this afternoon.  Admitted with altered mental status.  Physical Exam: Vitals:   11/01/23 1157 11/01/23 1202 11/01/23 1206 11/01/23 1250  BP: (!) 130/28 (!) 110/35 (!) 79/50 98/75  Pulse: (!) 50 67 (!) 50 95  Resp:   17 17  Temp: (!) 97.2 F (36.2 C)     TempSrc: Axillary     SpO2: 98% 97% 97% 97%  Weight:      Height:       Physical Exam HENT:     Head: Normocephalic.  Eyes:     General: Lids are normal.  Cardiovascular:     Rate and  Rhythm: Normal rate and regular rhythm.     Heart sounds: Normal heart sounds, S1 normal and S2 normal.  Pulmonary:     Breath sounds: Examination of the right-lower field reveals decreased breath sounds. Examination of the left-lower field reveals decreased breath sounds. Decreased breath sounds present. No wheezing, rhonchi or rales.  Abdominal:     Palpations: Abdomen is soft.     Tenderness: There is no abdominal tenderness.  Musculoskeletal:     Right lower leg: No swelling.     Left lower leg: No swelling.  Skin:    General: Skin is warm.     Findings: No rash.  Neurological:     Mental Status: She is lethargic.     Data Reviewed: pCO2 of 95, phosphorus 2.9, magnesium  2.2 Family Communication: Spoke with nephew on the phone and patient's sister's husband on the phone  Disposition: Status is: Inpatient Remains inpatient appropriate because: Continuous BiPAP started  Planned Discharge Destination: To be determined    Time spent: 28 minutes  Author: Charlie Patterson, MD 11/01/2023 3:56 PM  For on call review [email or web address].

## 2023-11-01 NOTE — Progress Notes (Signed)
 Physician notified that PC02 82 per lab.

## 2023-11-01 NOTE — Progress Notes (Signed)
 Physician notified of C02 per lab of 95.  See new orders

## 2023-11-01 NOTE — Plan of Care (Signed)
   Problem: Tissue Perfusion: Goal: Adequacy of tissue perfusion will improve Outcome: Progressing

## 2023-11-01 NOTE — Consult Note (Signed)
 PHARMACY CONSULT NOTE - ELECTROLYTES  Pharmacy Consult for Electrolyte Monitoring and Replacement   Recent Labs: Height: 5' 5 (165.1 cm) Weight: 89.5 kg (197 lb 5 oz) IBW/kg (Calculated) : 57 Estimated Creatinine Clearance: 50.6 mL/min (by C-G formula based on SCr of 0.59 mg/dL). Potassium (mmol/L)  Date Value  10/31/2023 3.7  08/14/2011 4.1   Magnesium  (mg/dL)  Date Value  92/68/7974 2.2   Calcium  (mg/dL)  Date Value  92/69/7974 9.5   Calcium , Total (mg/dL)  Date Value  94/86/7986 9.4   Albumin (g/dL)  Date Value  92/70/7974 3.1 (L)  08/10/2011 3.9   Phosphorus (mg/dL)  Date Value  92/68/7974 2.9   Sodium (mmol/L)  Date Value  10/31/2023 135  08/14/2011 138    Assessment  Virginia Clements is a 89 y.o. female presenting with cough, diarrhea, and AMS. PMH significant for HFpEF, PAF(not on AC), HTN, breast cancer s/p radiation, depression. Pharmacy has been consulted to monitor and replace electrolytes.  Diet: dysphagia 2  MIVF: D5/NS @ 73ml/hr.  Pertinent medications: NaCl 1g PO tablet twice daily  Goal of Therapy: Electrolytes WNL  Plan:  No replacement needed  F/u with AM labs.   Thank you for allowing pharmacy to be a part of this patient's care.  Cathaleen GORMAN Blanch, PharmD Clinical Pharmacist 11/01/2023 7:41 AM

## 2023-11-01 NOTE — Plan of Care (Signed)
   Problem: Education: Goal: Knowledge of General Education information will improve Description: Including pain rating scale, medication(s)/side effects and non-pharmacologic comfort measures Outcome: Progressing   Problem: Clinical Measurements: Goal: Ability to maintain clinical measurements within normal limits will improve Outcome: Progressing Goal: Diagnostic test results will improve Outcome: Progressing

## 2023-11-01 NOTE — Progress Notes (Signed)
Dear Doctor:  This patient has been identified as a candidate for PICC for the following reason (s): poor veins/poor circulatory system (CHF, COPD, emphysema, diabetes, steroid use, IV drug abuse, etc.) If you agree, please write an order for the indicated device. For any questions contact the Vascular Access Team at [phone] if no answer, please leave a message.  Thank you for supporting the early vascular access assessment program. 

## 2023-11-02 DIAGNOSIS — J9621 Acute and chronic respiratory failure with hypoxia: Secondary | ICD-10-CM | POA: Diagnosis not present

## 2023-11-02 DIAGNOSIS — G9341 Metabolic encephalopathy: Secondary | ICD-10-CM | POA: Diagnosis not present

## 2023-11-02 DIAGNOSIS — J9622 Acute and chronic respiratory failure with hypercapnia: Secondary | ICD-10-CM

## 2023-11-02 DIAGNOSIS — B9689 Other specified bacterial agents as the cause of diseases classified elsewhere: Secondary | ICD-10-CM

## 2023-11-02 DIAGNOSIS — N390 Urinary tract infection, site not specified: Secondary | ICD-10-CM | POA: Diagnosis not present

## 2023-11-02 DIAGNOSIS — J441 Chronic obstructive pulmonary disease with (acute) exacerbation: Secondary | ICD-10-CM | POA: Diagnosis not present

## 2023-11-02 LAB — BLOOD GAS, VENOUS
Acid-Base Excess: 10.7 mmol/L — ABNORMAL HIGH (ref 0.0–2.0)
Bicarbonate: 40.2 mmol/L — ABNORMAL HIGH (ref 20.0–28.0)
O2 Saturation: 81.2 %
Patient temperature: 37
pCO2, Ven: 78 mmHg (ref 44–60)
pH, Ven: 7.32 (ref 7.25–7.43)
pO2, Ven: 47 mmHg — ABNORMAL HIGH (ref 32–45)

## 2023-11-02 LAB — GLUCOSE, CAPILLARY
Glucose-Capillary: 134 mg/dL — ABNORMAL HIGH (ref 70–99)
Glucose-Capillary: 143 mg/dL — ABNORMAL HIGH (ref 70–99)
Glucose-Capillary: 147 mg/dL — ABNORMAL HIGH (ref 70–99)
Glucose-Capillary: 148 mg/dL — ABNORMAL HIGH (ref 70–99)

## 2023-11-02 LAB — RENAL FUNCTION PANEL
Albumin: 2.7 g/dL — ABNORMAL LOW (ref 3.5–5.0)
Anion gap: 8 (ref 5–15)
BUN: 9 mg/dL (ref 8–23)
CO2: 36 mmol/L — ABNORMAL HIGH (ref 22–32)
Calcium: 9.7 mg/dL (ref 8.9–10.3)
Chloride: 96 mmol/L — ABNORMAL LOW (ref 98–111)
Creatinine, Ser: 0.57 mg/dL (ref 0.44–1.00)
GFR, Estimated: >60 mL/min (ref >60)
Glucose, Bld: 138 mg/dL — ABNORMAL HIGH (ref 70–99)
Phosphorus: 1.9 mg/dL — ABNORMAL LOW (ref 2.5–4.6)
Potassium: 3.7 mmol/L (ref 3.5–5.1)
Sodium: 140 mmol/L (ref 135–145)

## 2023-11-02 LAB — URINE CULTURE: Culture: >=100000 — AB

## 2023-11-02 LAB — LEGIONELLA PNEUMOPHILA SEROGP 1 UR AG: L. pneumophila Serogp 1 Ur Ag: NEGATIVE

## 2023-11-02 LAB — MAGNESIUM: Magnesium: 2.2 mg/dL (ref 1.7–2.4)

## 2023-11-02 MED ORDER — POTASSIUM PHOSPHATES 15 MMOLE/5ML IV SOLN
15.0000 mmol | Freq: Once | INTRAVENOUS | Status: AC
  Administered 2023-11-02: 15 mmol via INTRAVENOUS
  Filled 2023-11-02: qty 5

## 2023-11-02 MED ORDER — PIPERACILLIN-TAZOBACTAM 3.375 G IVPB
3.3750 g | Freq: Three times a day (TID) | INTRAVENOUS | Status: AC
  Administered 2023-11-02 – 2023-11-05 (×11): 3.375 g via INTRAVENOUS
  Filled 2023-11-02 (×11): qty 50

## 2023-11-02 NOTE — Plan of Care (Signed)
 Palliative consult received.  Visited bedside with Virginia Clements, she is not able to participate in goals of care conversations. No family or visitors at bedside.  Attempted to call sister-[name] without success. Will attempt contact again tomorrow.  No Charge.  Waddell Lesches, DNP, AGNP-C Palliative Medicine  Please call Palliative Medicine team phone with any questions [phone]. For individual providers please see AMION.

## 2023-11-02 NOTE — TOC Progression Note (Addendum)
 Transition of Care [facility]) - Progression Note    Patient Details  Name: Virginia Clements MRN: [id number] Date of Birth: [birth year]  Transition of Care [facility]) CM/SW Contact  Tomasa JAYSON Childes, RN Phone Number: 11/02/2023, 11:22 AM  Clinical Narrative:    Attempt to reach patient's sister, [name]. Message left with [name]'s spouse.   NIV completed order form sent to [facility] from [employer].   2:40pm Spoke with patient's sister, [name] on the floor. She is under the impression a cash payment is needed for patient's BIPAP. She was advised a card is needed on file with [employer] per the Rep, Mitch in the event the insurance does not pay. [name] stated she did not have a card but to call her caregiver, [name] at [phone]. Mitch provided with the number.                      Expected Discharge Plan and Services                                               Social Drivers of Health (SDOH) Interventions SDOH Screenings   Food Insecurity: No Food Insecurity (05/31/2018)  Transportation Needs: No Transportation Needs (05/31/2018)  Financial Resource Strain: Low Risk  (05/31/2018)  Physical Activity: Unknown (05/31/2018)  Social Connections: Unknown (05/31/2018)  Stress: No Stress Concern Present (05/31/2018)  Tobacco Use: Medium Risk (10/30/2023)    Readmission Risk Interventions     No data to display

## 2023-11-02 NOTE — Consult Note (Signed)
 PHARMACY CONSULT NOTE - ELECTROLYTES  Pharmacy Consult for Electrolyte Monitoring and Replacement   Recent Labs: Height: 5' 5 (165.1 cm) Weight: 84.8 kg (186 lb 15.2 oz) (Simultaneous filing. User may not have seen previous data.) IBW/kg (Calculated) : 57 Estimated Creatinine Clearance: 49.2 mL/min (by C-G formula based on SCr of 0.57 mg/dL). Potassium (mmol/L)  Date Value  11/02/2023 3.7  08/14/2011 4.1   Magnesium  (mg/dL)  Date Value  91/98/7974 2.2   Calcium  (mg/dL)  Date Value  91/98/7974 9.7   Calcium , Total (mg/dL)  Date Value  94/86/7986 9.4   Albumin (g/dL)  Date Value  91/98/7974 2.7 (L)  08/10/2011 3.9   Phosphorus (mg/dL)  Date Value  91/98/7974 1.9 (L)   Sodium (mmol/L)  Date Value  11/02/2023 140  08/14/2011 138    Assessment  Virginia Clements is a 89 y.o. female presenting with cough, diarrhea, and AMS. PMH significant for HFpEF, PAF(not on AC), HTN, breast cancer s/p radiation, depression. Pharmacy has been consulted to monitor and replace electrolytes.  Diet: dysphagia 2  MIVF: none Pertinent medications: stopped NaCl 1g PO tablet twice daily  Goal of Therapy: Electrolytes WNL  Plan:  Medical team ordered Kphos 15 mmol IV x 1.  F/u with AM labs.   Thank you for allowing pharmacy to be a part of this patient's care.  Cathaleen GORMAN Blanch, PharmD Clinical Pharmacist 11/02/2023 7:24 AM

## 2023-11-02 NOTE — Progress Notes (Signed)
 Progress Note   Patient: Virginia Clements FMW:[id number] DOB: Dec 08, 1929 DOA: 10/30/2023     3 DOS: the patient was seen and examined on 11/02/2023   Brief hospital course: 89 y.o. female with medical history significant of wheelchair-bound, HFpEF, PAF not on AC, HTN, breast cancer status post radiation therapy, depression , who presents with cough, diarrhea, AMS.   Patient has AMS,  and is unable to provide any medical history, therefore, most of the history is obtained by discussing the case with ED physician, per EMS report, and with the nursing staff.   Per report, pt has cough in the past several days.  No fever.  Her temperature is 98.4 in ED. Chart review revealed that patient is not using oxygen and is oriented x 3 at her normal baseline.  She was found to have moderate acute respiratory distress.  He has oxygen desaturation to 80-82% on room air, which improved to 96% on 4L oxygen.  Not sure if patient has any chest pain.  She has mild wheezing on auscultation by my examination.  She has altered mental status, not arousable, not following command.  She moves all extremities on painful stimuli.  No facial droop noted.  Per report, patient has several episode of diarrhea.  No active nausea vomiting noted.  Does not seem to have abdominal tenderness on examination.  Not sure if patient has symptoms of UTI.   Data reviewed independently and ED Course: pt was found to have WBC 16.6, lactic acid 1.0 --> 1.0, negative PCR for COVID, flu and RSV, UA (cloudy appearance, small amount of leukocyte, rare bacteria, WBC> 50, squamous cell 6-10), sodium 128, potassium 2.6, GFR> 60, negative C. difficile test. Temperature normal, blood pressure 129/58, heart rate 50-90s, RR 24 --> 19.  VBG with pH 7.3, CO2 70, O2 44.  Chest x-ray showed cardiomegaly, vascular congestion without infiltration.  CT of head negative for acute intracranial abnormalities.  Patient is admitted to PCU as inpatient.  7/30.   Patient unresponsive to sternal rub.  Stool culture positive for norovirus. 7/31.  VBG shows a PCO2 of 90.  Started on BiPAP.  Patient able to answer some questions in the afternoon.  Repeat ABG showed a PCO2 of 82. 8/1.  pCO2 of 78 on VBG this morning.  Patient still not responsive enough to pass swallow evaluation.  Continue antibiotics for urinary tract infection.  Patient will need intermittent BiPAP during the day and BiPAP at night.  Assessment and Plan: * Acute metabolic encephalopathy CT scan of the head negative.  MRI of the brain is negative.  Could be secondary to urinary tract infection or norovirus infection and or CO2 retention.  BiPAP started yesterday.  Will need intermittent BiPAP during the day if lethargic and BiPAP definitely needed at night.  Not awake enough to pass swallow evaluation today.  Overall prognosis poor.  Will get palliative care consultation.  Acute on chronic respiratory failure with hypoxia and hypercapnia (HCC) PCO2 of 78 on repeat venous blood gas.  Intermittent BiPAP during the day and BiPAP at night.  Without the use of the ventilator the patient's condition will quickly deteriorate. Removal of the ventilator may cause serious harm to the patient, exacerbation of condition and hospital readmission. Bilevel/RAD with backup rate has been tried and failed to maintain or stabilize the patient. Bilevel does not meet current volume requirements. pCO2 from ABG results on Bilevel are still elevated. Patient requires frequent durations of ventilatory support. Intermittent usage is insufficient  Urinary tract infection due to ESBL Klebsiella Change antibiotics over to Zosyn.  COPD with acute exacerbation (HCC) Patient started on Solu-Medrol  and nebulizer treatments and empiric antibiotics.  Acute on chronic diastolic CHF (congestive heart failure) (HCC) Since n.p.o. with altered mental status will hold on Lasix  for right now.  PAF (paroxysmal atrial fibrillation)  (HCC) Brief episode of atrial fibrillation.  Hyponatremia Last sodium normal range  Hypokalemia Replaced  Obesity (BMI 30-39.9) BMI 33.27  Hypotension Fluid bolus  Impaired fasting glucose Checking hemoglobin A1c.  Very sensitive sliding scale.  Infection due to Norovirus species Supportive care        Subjective: Patient answered yes that she was hungry.  Did not wake up enough for passing swallow evaluation today.  Took off BiPAP with nursing staff today.  Will need intermittent BiPAP and BiPAP at night.  Admitted with altered mental status.  Physical Exam: Vitals:   11/02/23 0354 11/02/23 0500 11/02/23 0754 11/02/23 0820  BP: (!) 146/79  (!) 145/75   Pulse: 91  70   Resp: 20  18   Temp: 97.7 F (36.5 C)  97.7 F (36.5 C)   TempSrc: Axillary     SpO2: 94%  94% 100%  Weight:  84.8 kg    Height:       Physical Exam HENT:     Head: Normocephalic.  Eyes:     General: Lids are normal.  Cardiovascular:     Rate and Rhythm: Normal rate and regular rhythm.     Heart sounds: Normal heart sounds, S1 normal and S2 normal.  Pulmonary:     Breath sounds: Examination of the right-lower field reveals decreased breath sounds. Examination of the left-lower field reveals decreased breath sounds. Decreased breath sounds present. No wheezing, rhonchi or rales.  Abdominal:     Palpations: Abdomen is soft.     Tenderness: There is no abdominal tenderness.  Musculoskeletal:     Right lower leg: Swelling present.     Left lower leg: Swelling present.  Skin:    General: Skin is warm.     Findings: No rash.  Neurological:     Mental Status: She is lethargic.     Data Reviewed: ABG on BiPAP shows a PCO2 of 82 on 7/31, VBG 8/1 showing PCO2 of 78, creatinine 0.57 Klebsiella ESBL growing out of urine culture  Family Communication: Spoke with family  Disposition: Status is: Inpatient Remains inpatient appropriate because: Will get palliative care consultation since patient  has not woken up yet.  Change Rocephin  over to Zosyn with ESBL Klebsiella growing out of urine culture.  Planned Discharge Destination: To be determined    Time spent: 28 minutes Spoke with respiratory nursing staff and speech pathology.  Author: Charlie Patterson, MD 11/02/2023 11:18 AM  For on call review [email or web address].

## 2023-11-02 NOTE — Progress Notes (Signed)
 SLP Cancellation Note  Patient Details Name: Virginia Clements MRN: [id number] DOB: 27-Jul-1929   Cancelled treatment:       Reason Eval/Treat Not Completed: Medical issues which prohibited therapy  This writer received secure chat form pt's nurse Do you have time to come again. She is awake. This Clinical research associate re-attempted but pt is currently on BIPAP.   Will continue to follow and attempt as available.   Bradee Common B. Rubbie, M.S., CCC-SLP, CBIS Speech-Language Pathologist Certified Brain Injury Specialist [facility] [phone] Ascom [phone] Fax [phone]  Kimberlye Dilger Rubbie 11/02/2023, 11:45 AM

## 2023-11-02 NOTE — Evaluation (Signed)
Clinical/Bedside Swallow Evaluation Patient Details  Name: Virginia Clements MRN: [id number] Date of Birth: 01-28-1930  Today's Date: 11/02/2023 Time: SLP Start Time (ACUTE ONLY): 0847 SLP Stop Time (ACUTE ONLY): 0857 SLP Time Calculation (min) (ACUTE ONLY): 10 min  Past Medical History:  Past Medical History:  Diagnosis Date   Breast cancer (HCC)    Hypertension    Hypoglycemia    PAF (paroxysmal atrial fibrillation) (HCC)    Radiation 2009   Past Surgical History:  Past Surgical History:  Procedure Laterality Date   BREAST BIOPSY Right 2009   HPI:  Virginia Clements is a 89 y.o. female with medical history significant of wheelchair-bound, HFpEF, PAF not on AC, HTN, breast cancer status post radiation therapy, depression , who presents with cough, diarrhea, AMS to [facility] ED on 10/30/2023. Chest x-ray (10/30/2023) revealed Cardiomegaly with vascular congestion. No focal consolidation.; MRI on 10/30/2023 revealed 1. Age-related atrophy and advanced cerebral white matter disease. No apparent acute process. Pt was placed on BIPAP around 1157 (11/01/2023) and was taken off at 0820 (11/02/2023).    Assessment / Plan / Recommendation  Clinical Impression  Pt's nurse made this writer aware that pt was taken off BIPAP this morning.   During this evaluation, pt remained unable to arouse to verbal, heavy tactile stimulation (sternal rub, repositioning and cold washcloth). At this time, pt is not appropriate for PO consumption d/t lethargy. Secure chat sent to pt's attending and pt's nurse made aware of recommendation. ST services to follow. SLP Visit Diagnosis: Dysphagia, unspecified (R13.10)    Aspiration Risk  Severe aspiration risk;Risk for inadequate nutrition/hydration    Diet Recommendation NPO    Medication Administration: Via alternative means    Other  Recommendations Oral Care Recommendations: Oral care QID     Assistance Recommended at Discharge    Functional  Status Assessment Patient has had a recent decline in their functional status and/or demonstrates limited ability to make significant improvements in function in a reasonable and predictable amount of time  Frequency and Duration min 2x/week  2 weeks       Prognosis Prognosis for improved oropharyngeal function: Fair Barriers to Reach Goals: Cognitive deficits;Severity of deficits      Swallow Study   General Date of Onset: 10/30/23 HPI: Virginia Clements is a 89 y.o. female with medical history significant of wheelchair-bound, HFpEF, PAF not on AC, HTN, breast cancer status post radiation therapy, depression , who presents with cough, diarrhea, AMS to [facility] ED on 10/30/2023. Chest x-ray (10/30/2023) revealed Cardiomegaly with vascular congestion. No focal consolidation.; MRI on 10/30/2023 revealed 1. Age-related atrophy and advanced cerebral white matter disease. No apparent acute process. Pt was placed on BIPAP around 1157 (11/01/2023) and was taken off at 0820 (11/02/2023). Type of Study: Bedside Swallow Evaluation Previous Swallow Assessment: none in chart Diet Prior to this Study: NPO Temperature Spikes Noted: No Respiratory Status: Nasal cannula History of Recent Intubation: No Behavior/Cognition: Lethargic/Drowsy Oral Cavity Assessment: Within Functional Limits    Oral/Motor/Sensory Function     Ice Chips Ice chips: Not tested   Thin Liquid Thin Liquid: Not tested    Nectar Thick Nectar Thick Liquid: Not tested   Honey Thick Honey Thick Liquid: Not tested   Puree Puree: Not tested   Solid     Solid: Not tested     Virginia Clements B. Rubbie, M.S., CCC-SLP, CBIS Speech-Language Pathologist Certified Brain Injury Specialist [facility]  [facility] Office [phone] Ascom [phone] Fax  [phone]     

## 2023-11-02 NOTE — Plan of Care (Signed)

## 2023-11-03 ENCOUNTER — Encounter: Payer: Self-pay | Admitting: Internal Medicine

## 2023-11-03 DIAGNOSIS — R4182 Altered mental status, unspecified: Secondary | ICD-10-CM

## 2023-11-03 DIAGNOSIS — Z7189 Other specified counseling: Secondary | ICD-10-CM

## 2023-11-03 DIAGNOSIS — Z515 Encounter for palliative care: Secondary | ICD-10-CM | POA: Diagnosis not present

## 2023-11-03 DIAGNOSIS — J9621 Acute and chronic respiratory failure with hypoxia: Secondary | ICD-10-CM | POA: Diagnosis not present

## 2023-11-03 DIAGNOSIS — G9341 Metabolic encephalopathy: Secondary | ICD-10-CM | POA: Diagnosis not present

## 2023-11-03 DIAGNOSIS — Z66 Do not resuscitate: Secondary | ICD-10-CM | POA: Diagnosis not present

## 2023-11-03 DIAGNOSIS — J441 Chronic obstructive pulmonary disease with (acute) exacerbation: Secondary | ICD-10-CM | POA: Diagnosis not present

## 2023-11-03 DIAGNOSIS — N390 Urinary tract infection, site not specified: Secondary | ICD-10-CM | POA: Diagnosis not present

## 2023-11-03 LAB — GLUCOSE, CAPILLARY
Glucose-Capillary: 113 mg/dL — ABNORMAL HIGH (ref 70–99)
Glucose-Capillary: 164 mg/dL — ABNORMAL HIGH (ref 70–99)
Glucose-Capillary: 189 mg/dL — ABNORMAL HIGH (ref 70–99)
Glucose-Capillary: 165 mg/dL — ABNORMAL HIGH (ref 70–99)

## 2023-11-03 LAB — RENAL FUNCTION PANEL
Albumin: 2.9 g/dL — ABNORMAL LOW (ref 3.5–5.0)
Anion gap: 15 (ref 5–15)
BUN: 11 mg/dL (ref 8–23)
CO2: 35 mmol/L — ABNORMAL HIGH (ref 22–32)
Calcium: 9.8 mg/dL (ref 8.9–10.3)
Chloride: 92 mmol/L — ABNORMAL LOW (ref 98–111)
Creatinine, Ser: 0.45 mg/dL (ref 0.44–1.00)
GFR, Estimated: >60 mL/min (ref >60)
Glucose, Bld: 132 mg/dL — ABNORMAL HIGH (ref 70–99)
Phosphorus: 1.6 mg/dL — ABNORMAL LOW (ref 2.5–4.6)
Potassium: 3.5 mmol/L (ref 3.5–5.1)
Sodium: 142 mmol/L (ref 135–145)

## 2023-11-03 LAB — CBC
HCT: 38 % (ref 36.0–46.0)
Hemoglobin: 12 g/dL (ref 12.0–15.0)
MCH: 31 pg (ref 26.0–34.0)
MCHC: 31.6 g/dL (ref 30.0–36.0)
MCV: 98.2 fL (ref 80.0–100.0)
Platelets: 257 K/uL (ref 150–400)
RBC: 3.87 MIL/uL (ref 3.87–5.11)
RDW: 14.1 % (ref 11.5–15.5)
WBC: 9.2 K/uL (ref 4.0–10.5)
nRBC: 0 % (ref 0.0–0.2)

## 2023-11-03 MED ORDER — POTASSIUM PHOSPHATES 15 MMOLE/5ML IV SOLN
30.0000 mmol | Freq: Once | INTRAVENOUS | Status: AC
  Administered 2023-11-03: 30 mmol via INTRAVENOUS
  Filled 2023-11-03: qty 10

## 2023-11-03 MED ORDER — FUROSEMIDE 40 MG PO TABS
40.0000 mg | ORAL_TABLET | Freq: Every day | ORAL | Status: DC
  Administered 2023-11-03 – 2023-11-05 (×3): 40 mg via ORAL
  Filled 2023-11-03 (×3): qty 1

## 2023-11-03 MED ORDER — TIMOLOL MALEATE 0.5 % OP SOLN
1.0000 [drp] | Freq: Two times a day (BID) | OPHTHALMIC | Status: DC
  Administered 2023-11-04 – 2023-11-07 (×7): 1 [drp] via OPHTHALMIC
  Filled 2023-11-03: qty 5

## 2023-11-03 MED ORDER — METOPROLOL TARTRATE 25 MG PO TABS
25.0000 mg | ORAL_TABLET | Freq: Two times a day (BID) | ORAL | Status: DC
  Administered 2023-11-03 – 2023-11-07 (×9): 25 mg via ORAL
  Filled 2023-11-03 (×9): qty 1

## 2023-11-03 MED ORDER — ENSURE PLUS HIGH PROTEIN PO LIQD
237.0000 mL | Freq: Two times a day (BID) | ORAL | Status: DC
  Administered 2023-11-04 – 2023-11-07 (×5): 237 mL via ORAL

## 2023-11-03 MED ORDER — NYSTATIN 100000 UNIT/ML MT SUSP
5.0000 mL | Freq: Four times a day (QID) | OROMUCOSAL | Status: DC
  Administered 2023-11-03 – 2023-11-07 (×16): 500000 [IU] via ORAL
  Filled 2023-11-03 (×16): qty 5

## 2023-11-03 MED ORDER — FAMOTIDINE 20 MG PO TABS
20.0000 mg | ORAL_TABLET | Freq: Every day | ORAL | Status: DC
  Administered 2023-11-03 – 2023-11-07 (×5): 20 mg via ORAL
  Filled 2023-11-03 (×5): qty 1

## 2023-11-03 NOTE — Progress Notes (Addendum)
 PT Cancellation Note  Patient Details Name: KHERINGTON MERAZ MRN: [id number] DOB: 1929/05/16   Cancelled Treatment:    Reason Eval/Treat Not Completed: PT screened, no needs identified, will sign off. PT called to speak with pt's family (sister and sister's husband) regarding their wishes for discharge disposition. Pt's family will be taking pt back home where she lives with her sister and brother-in-law. Pt is bed bound and has 24/7 total care, as well as all DME needs at home. Pt was not receiving any HHPT services prior to admission. Pt currently at her baseline in regards to functional mobility and not a rehab candidate. Pt's family expressed understanding and agreed with this. Will dc orders at this time.    Delon HERO Laree Garron 11/03/2023, 1:49 PM

## 2023-11-03 NOTE — Assessment & Plan Note (Signed)
 Restart metoprolol  and Lasix 

## 2023-11-03 NOTE — Progress Notes (Addendum)
 Speech Language Pathology Treatment: Dysphagia  Patient Details Name: Virginia Clements MRN: [id number] DOB: 13-Jun-1929 Today's Date: 11/03/2023 Time: 0920-0940 SLP Time Calculation (min) (ACUTE ONLY): 20 min  Assessment / Plan / Recommendation Clinical Impression  Pt seen for dysphagia intervention to determine PO readiness. RN and MD contacting clinician regarding pt being more alert and off BiPAP. Upon therapist entrance to room, pt alert and eager for assessment. Pleasantly confused, with intermittent need for verbal cues to redirect attention to task of eating. Pt on 4L O2, WBC WNL. PO trials completed of thin liquids (cup and straw), puree, and regular solids. Oral phase impacted by edentulous nature and confusion, with need for min increased time and liquid wash for oral clearance. Training provided for alternating solids and liquids to aid oral manipulation and clearance. Pt attempted to suck on spoon as if it was a straw- assistance provided for use of utensil and set up for trials- able to self feed finger foods and bring cup to lips. Impulsivity noted with thin liquid intake with verbal cues provided for slowing rate/amount with min noted improvement.   Pt with delayed throat clear and congested cough (consistent with baseline) following LARGE, SEQUENTIAL sips of thin liquids, reduced occurrence with cued small sips. Later pt reporting globus sensation and need to vomit. No other s/sx of aspiration. No noted hx of GERD/esophageal considerations- no history of GI involvement per chart review. MD notified of GI concerns.   Recommend initiation of thin liquids and Dys 2 (chopped solids)- moistened for ease of mastication. Supervision for intake- monitor for impulsivity with thin liquid intake. Alternate solids and liquids and provide extended time for oral clearance. Given current respiratory factors, age, deconditioning, and AMS- pt is at increased risk of aspiration- recommend  aspiration precautions, including slow rate, small bites, elevated HOB, and alert for PO intake.   MD and RN aware of recommendations. SLP will continue to follow.    HPI HPI: Virginia Clements is a 89 y.o. female with medical history significant of wheelchair-bound, HFpEF, PAF not on AC, HTN, breast cancer status post radiation therapy, depression , who presents with cough, diarrhea, AMS to [facility] ED on 10/30/2023. Chest x-ray (10/30/2023) revealed Cardiomegaly with vascular congestion. No focal consolidation.; MRI on 10/30/2023 revealed 1. Age-related atrophy and advanced cerebral white matter disease. No apparent acute process. Pt was placed on BIPAP around 1157 (11/01/2023) and was taken off at 0820 (11/02/2023).      SLP Plan  Continue with current plan of care          Recommendations  Diet recommendations: Dysphagia 2 (fine chop);Thin liquid Liquids provided via: Cup (monitor straw use) Medication Administration: Whole meds with puree (vs crushed) Supervision: Staff to assist with self feeding;Full supervision/cueing for compensatory strategies Compensations: Minimize environmental distractions;Slow rate;Small sips/bites;Multiple dry swallows after each bite/sip;Follow solids with liquid Postural Changes and/or Swallow Maneuvers: Seated upright 90 degrees;Upright 30-60 min after meal                  Oral care QID   Frequent or constant Supervision/Assistance Dysphagia, unspecified (R13.10)     Continue with current plan of care    [place] Orpha Dain Clapp, MS, CCC-SLP Speech Language Pathologist Rehab Services; [facility] [phone] (ascom)   [place] J Clapp  11/03/2023, 9:41 AM

## 2023-11-03 NOTE — Progress Notes (Signed)
 Tele orders okay to expire per Dr. Josette.

## 2023-11-03 NOTE — Progress Notes (Signed)
 Progress Note   Patient: Virginia Clements FMW:[id number] DOB: 26-Apr-1929 DOA: 10/30/2023     4 DOS: the patient was seen and examined on 11/03/2023   Brief hospital course: 89 y.o. female with medical history significant of wheelchair-bound, HFpEF, PAF not on AC, HTN, breast cancer status post radiation therapy, depression , who presents with cough, diarrhea, AMS.   Patient has AMS,  and is unable to provide any medical history, therefore, most of the history is obtained by discussing the case with ED physician, per EMS report, and with the nursing staff.   Per report, pt has cough in the past several days.  No fever.  Her temperature is 98.4 in ED. Chart review revealed that patient is not using oxygen and is oriented x 3 at her normal baseline.  She was found to have moderate acute respiratory distress.  He has oxygen desaturation to 80-82% on room air, which improved to 96% on 4L oxygen.  Not sure if patient has any chest pain.  She has mild wheezing on auscultation by my examination.  She has altered mental status, not arousable, not following command.  She moves all extremities on painful stimuli.  No facial droop noted.  Per report, patient has several episode of diarrhea.  No active nausea vomiting noted.  Does not seem to have abdominal tenderness on examination.  Not sure if patient has symptoms of UTI.   Data reviewed independently and ED Course: pt was found to have WBC 16.6, lactic acid 1.0 --> 1.0, negative PCR for COVID, flu and RSV, UA (cloudy appearance, small amount of leukocyte, rare bacteria, WBC> 50, squamous cell 6-10), sodium 128, potassium 2.6, GFR> 60, negative C. difficile test. Temperature normal, blood pressure 129/58, heart rate 50-90s, RR 24 --> 19.  VBG with pH 7.3, CO2 70, O2 44.  Chest x-ray showed cardiomegaly, vascular congestion without infiltration.  CT of head negative for acute intracranial abnormalities.  Patient is admitted to PCU as inpatient.  7/30.   Patient unresponsive to sternal rub.  Stool culture positive for norovirus. 7/31.  VBG shows a PCO2 of 90.  Started on BiPAP.  Patient able to answer some questions in the afternoon.  Repeat ABG showed a PCO2 of 82. 8/1.  pCO2 of 78 on VBG this morning.  Patient still not responsive enough to pass swallow evaluation.  Continue antibiotics for urinary tract infection.  Patient will need intermittent BiPAP during the day and BiPAP at night.  Assessment and Plan: * Acute metabolic encephalopathy Mental status much improved today on 8/2.  Likely with changing antibiotics over to Zosyn  for resistant Klebsiella ESBL urinary infection helped.  CT scan of the head negative.  MRI of the brain is negative.     Acute on chronic respiratory failure with hypoxia and hypercapnia (HCC) PCO2 of 78 on repeat venous blood gas.  Intermittent BiPAP during the day and BiPAP at night.  Without the use of the ventilator the patient's condition will quickly deteriorate. Removal of the ventilator may cause serious harm to the patient, exacerbation of condition and hospital readmission. Bilevel/RAD with backup rate has been tried and failed to maintain or stabilize the patient. Bilevel does not meet current volume requirements. pCO2 from ABG results on Bilevel are still elevated. Patient requires frequent durations of ventilatory support. Intermittent usage is insufficient   Urinary tract infection due to ESBL Klebsiella Change antibiotics over to Zosyn  on 8/1.  [facility]D with [facility]ation [facility] Patient started on Solu-Medrol  and nebulizer treatments  and empiric antibiotics.  Acute on chronic diastolic CHF (congestive heart failure) (HCC) Will restart Lasix  40 mg daily and metoprolol   PAF (paroxysmal atrial fibrillation) (HCC) Brief episode of atrial fibrillation.  Restart metoprolol   Hyponatremia Last sodium normal range  Hypokalemia Replaced  Benign essential HTN Restart metoprolol  and Lasix   Obesity (BMI  30-39.9) BMI 31.4  Hypotension Resolved  Impaired fasting glucose Last hemoglobin A1c 5.1.  Infection due to Norovirus species Supportive care        Subjective: Patient awake today and able to talk.  Patient states that she does not walk.  For the last 3 days she has been unresponsive.  Antibiotics switched yesterday for resistant urinary tract infection.  Physical Exam: Vitals:   11/03/23 0406 11/03/23 0500 11/03/23 0741 11/03/23 0755  BP: (!) 154/77  (!) 164/78   Pulse: 93  88   Resp: (!) 22  16   Temp: 98.1 F (36.7 C)  98 F (36.7 C)   TempSrc:      SpO2: 100%  100% 100%  Weight:  85.6 kg    Height:       Physical Exam HENT:     Head: Normocephalic.  Eyes:     General: Lids are normal.  Cardiovascular:     Rate and Rhythm: Normal rate and regular rhythm.     Heart sounds: Normal heart sounds, S1 normal and S2 normal.  Pulmonary:     Breath sounds: Examination of the right-lower field reveals decreased breath sounds. Examination of the left-lower field reveals decreased breath sounds. Decreased breath sounds present. No wheezing, rhonchi or rales.  Abdominal:     Palpations: Abdomen is soft.     Tenderness: There is no abdominal tenderness.  Musculoskeletal:     Right lower leg: Swelling present.     Left lower leg: Swelling present.  Skin:    General: Skin is warm.     Findings: No rash.  Neurological:     Mental Status: She is alert.     Comments: Answers questions appropriately.     Data Reviewed: Creatinine 0.45, potassium 3.5, phosphorus 1.6, CBC normal range, urine culture growing ESBL Klebsiella  Family Communication: Spoke with [name]'s husband  Disposition: Status is: Inpatient Remains inpatient appropriate because: Continue IV Zosyn   Planned Discharge Destination: Home with Home Health    Time spent: 28 minutes  Author: Charlie Patterson, MD 11/03/2023 1:01 PM  For on call review [email or web address].

## 2023-11-03 NOTE — Consult Note (Addendum)
 Consultation Note Date: 11/03/2023 at 1015  Patient Name: Virginia Clements  DOB: [birth year]  MRN: [id number]  Age / Sex: 89 y.o., female  PCP: Rudolpho Norleen BIRCH, MD Referring Physician: Josette Ade, MD  HPI/Patient Profile: 89 y.o. female  with past medical history significant for bedbound status, HFpEF, PAF not on AC, HTN, breast cancer s/p radiation (2009) and depression. Patient presented to ED from home via EMS 10/30/2023 c/o cough, diarrhea and AMS. Pt was unable to provide history on arrival. On scene, patient was found to be in moderate respiratory distress with SpO2 80-82% RA, improved to 96% on 4L O2.   ED labs significant for WBC 16.6, lactic 1.0=> 1.0, Na+ 128, K+ 2.6, GFR >60. VBG pH 7.3, CO2 70, O2 44. UA + small leukocytes, rare bacteria, WBC >50. Covid/flu/PCR negative. CXR showed cardiomegaly, vascular congestion without infiltration. CT head- no acute intracranial abnormalities.   BP 139/77, HR 50-90's, 96% 4L, RR 24, 98.4.   Patient was admitted to PCU for management of acute respiratory failure with hypoxia/hypercapnia, acute bronchitis, acute metabolic encephalopathy, COPD exacerbation, UTI, PAF and Norovirus infection.   Palliative was consulted to assist with goals of care conversations.    Clinical Assessment and Goals of Care:  Ill-appearing, elderly female resting in bed. She is alert and able to participate in conversation, but does exhibit some intermittent confusion.  She is oriented to self, location, city and DOB. She incorrectly states year is [place], answers do not know when asked about the president. She is unaware of how or why she is here.   She denies pain. Slept off/on last night because of noise. Complains of mild CP. Denies SOB. No appetite at this time.   Extensive chart review completed prior to meeting patient including labs, vital signs, imaging, progress  notes, orders, and available advanced directive documents from current and previous encounters. I then met with patient  to discuss diagnosis prognosis, GOC, EOL wishes, disposition and options.  I introduced Palliative Medicine as specialized medical care for people living with serious illness. It focuses on providing relief from the symptoms and stress of a serious illness. The goal is to improve quality of life for both the patient and the family.  We discussed a brief life review of the patient. Virginia Clements reports that she was married to her husband for >50 years. She cannot remember when he died. She shares that she currently lives with her sister, [name] and Zell (brother-in-law) and niece who has CP. She never had children of her own. She worked at [employer] doing various jobs until she retired.   As far as functional and nutritional status, Virginia Clements states she previously used a wheelchair, but is mainly in the bed now. She has a caregiver, Dianne, that comes into her home twice daily. Dianne assist with all ADLs. Meals are prepared by sister or caregiver. Medications are managed and placed into pill box for patient's sister to administer.   We discussed patient's current illness and what it means in  the larger context of patient's on-going co-morbidities.  Natural disease trajectory and expectations at EOL were discussed.  I attempted to elicit values and goals of care important to the patient. The most important goal at this time is to go home and spend time with Ramsey. Patient asks numerous times during visit if she can go home. Explained that once her labs are back to normal, she can eat/drink and they feel she is medically stable, they will at that time determine if she is safe to be discharged home.   The difference between aggressive medical intervention and comfort care was considered in light of the patient's goals of care.   Advance directives, concepts specific to code status,  artificial feeding and hydration, and rehospitalization were considered and discussed. Patient is adamant during goals of care conversation that she would never want CPR, breathing machine or PEG tube. CODE STATUS DNR/DNI.   Education offered regarding concept specific to human mortality and the limitations of medical interventions to prolong life when the body begins to fail to thrive.  Family is facing treatment option decisions, advanced directive, and anticipatory care needs.    Discussed with patient the importance of continued conversation with family and the medical providers regarding overall plan of care and treatment options, ensuring decisions are within the context of the patient's values and GOCs. Mr. Mcadams shares that her sister is her surrogate decision maker if she were to become confused and not able to make decisions for herself.   Palliative Care services outpatient were explained and offered. The patient states she is not sure and should ask [name] and Zell does not like people coming in and out of his home.   Attempted to call [name] without success. No voicemail option. Will continue to contact [name] for GOC discussion and possible OP palliative care services.   Addendum 1343: Spoke to [employer] (brother-in law). He shares that patient has been bedbound for approximately 4 years. He confirms that she receives care from Agnew, caregiver, and his wife, [name].  Offered for patient to go home with palliative care services at discharge. Zell states he is not interested in palliative care at this time. Advised that he could request these services through hospital later if he changes his mind. He is appreciate of the update and for phone call.   Questions and concerns were addressed. The family was encouraged to call with questions or concerns.   Primary Decision Maker NEXT OF KIN- [name]- sister  Physical Exam Vitals reviewed.  Constitutional:      General: She is not in acute  distress.    Appearance: She is ill-appearing. She is not toxic-appearing.  HENT:     Head: Normocephalic and atraumatic.     Mouth/Throat:     Mouth: Mucous membranes are dry.  Neurological:     Mental Status: She is alert.    Recommendations/Plan: Continue DNR/DNI status as previously documented    Continue current supportive interventions Family not interested in OP palliative services  Palliative Assessment/Data: 30-40%     Thank you for this consult. Palliative medicine will continue to follow and assist holistically.   Time Total: 90 minutes  Time spent includes: Detailed review of medical records (labs, imaging, vital signs), medically appropriate exam (mental status, respiratory, cardiac, skin), discussed with treatment team, counseling and educating patient, family and staff, documenting clinical information, medication management and coordination of care.     Devere Sacks, ELNITA- [facility] Palliative Medicine Team  11/03/2023 8:40 AM  Office [phone]  Pager [phone]     Please contact Palliative Medicine Team providers via AMION for questions and concerns.

## 2023-11-03 NOTE — Consult Note (Signed)
 PHARMACY CONSULT NOTE - ELECTROLYTES  Pharmacy Consult for Electrolyte Monitoring and Replacement   Recent Labs: Height: 5' 5 (165.1 cm) Weight: 85.6 kg (188 lb 11.4 oz) IBW/kg (Calculated) : 57 Estimated Creatinine Clearance: 49.5 mL/min (by C-G formula based on SCr of 0.45 mg/dL). Potassium (mmol/L)  Date Value  11/03/2023 3.5  08/14/2011 4.1   Magnesium  (mg/dL)  Date Value  91/98/7974 2.2   Calcium  (mg/dL)  Date Value  91/97/7974 9.8   Calcium , Total (mg/dL)  Date Value  94/86/7986 9.4   Albumin (g/dL)  Date Value  91/97/7974 2.9 (L)  08/10/2011 3.9   Phosphorus (mg/dL)  Date Value  91/97/7974 1.6 (L)   Sodium (mmol/L)  Date Value  11/03/2023 142  08/14/2011 138    Assessment  Virginia Clements is a 89 y.o. female presenting with cough, diarrhea, and AMS. PMH significant for HFpEF, PAF(not on AC), HTN, breast cancer s/p radiation, depression. Pharmacy has been consulted to monitor and replace electrolytes.  Diet: NPO MIVF: none Pertinent medications: stopped NaCl 1g PO tablet twice daily  Goal of Therapy: Electrolytes WNL  Plan:  - Kphos 30 mmol IV x 1.  - F/u with AM labs.   Thank you for allowing pharmacy to be a part of this patient's care.  Virginia Clements, PharmD Clinical Pharmacist 11/03/2023 9:36 AM

## 2023-11-04 DIAGNOSIS — Z515 Encounter for palliative care: Secondary | ICD-10-CM | POA: Diagnosis not present

## 2023-11-04 DIAGNOSIS — J441 Chronic obstructive pulmonary disease with (acute) exacerbation: Secondary | ICD-10-CM | POA: Diagnosis not present

## 2023-11-04 DIAGNOSIS — Z7401 Bed confinement status: Secondary | ICD-10-CM

## 2023-11-04 DIAGNOSIS — Z789 Other specified health status: Secondary | ICD-10-CM

## 2023-11-04 DIAGNOSIS — Z66 Do not resuscitate: Secondary | ICD-10-CM | POA: Diagnosis not present

## 2023-11-04 DIAGNOSIS — N390 Urinary tract infection, site not specified: Secondary | ICD-10-CM | POA: Diagnosis not present

## 2023-11-04 DIAGNOSIS — Z7189 Other specified counseling: Secondary | ICD-10-CM | POA: Diagnosis not present

## 2023-11-04 DIAGNOSIS — I5033 Acute on chronic diastolic (congestive) heart failure: Secondary | ICD-10-CM | POA: Diagnosis not present

## 2023-11-04 DIAGNOSIS — R4182 Altered mental status, unspecified: Secondary | ICD-10-CM | POA: Diagnosis not present

## 2023-11-04 DIAGNOSIS — G9341 Metabolic encephalopathy: Secondary | ICD-10-CM | POA: Diagnosis not present

## 2023-11-04 LAB — CULTURE, BLOOD (ROUTINE X 2)
Culture: NO GROWTH
Culture: NO GROWTH

## 2023-11-04 LAB — BASIC METABOLIC PANEL WITH GFR
Anion gap: 10 (ref 5–15)
BUN: 11 mg/dL (ref 8–23)
CO2: 40 mmol/L — ABNORMAL HIGH (ref 22–32)
Calcium: 9.5 mg/dL (ref 8.9–10.3)
Chloride: 89 mmol/L — ABNORMAL LOW (ref 98–111)
Creatinine, Ser: 0.49 mg/dL (ref 0.44–1.00)
GFR, Estimated: >60 mL/min (ref >60)
Glucose, Bld: 139 mg/dL — ABNORMAL HIGH (ref 70–99)
Potassium: 3.4 mmol/L — ABNORMAL LOW (ref 3.5–5.1)
Sodium: 139 mmol/L (ref 135–145)

## 2023-11-04 LAB — GLUCOSE, CAPILLARY
Glucose-Capillary: 134 mg/dL — ABNORMAL HIGH (ref 70–99)
Glucose-Capillary: 218 mg/dL — ABNORMAL HIGH (ref 70–99)
Glucose-Capillary: 228 mg/dL — ABNORMAL HIGH (ref 70–99)
Glucose-Capillary: 243 mg/dL — ABNORMAL HIGH (ref 70–99)

## 2023-11-04 LAB — PHOSPHORUS: Phosphorus: 1.6 mg/dL — ABNORMAL LOW (ref 2.5–4.6)

## 2023-11-04 LAB — MAGNESIUM: Magnesium: 2.5 mg/dL — ABNORMAL HIGH (ref 1.7–2.4)

## 2023-11-04 MED ORDER — ORAL CARE MOUTH RINSE: 15.0000 mL | OROMUCOSAL | Status: DC | PRN

## 2023-11-04 MED ORDER — K PHOS MONO-SOD PHOS DI & MONO 155-852-130 MG PO TABS
500.0000 mg | ORAL_TABLET | Freq: Every day | ORAL | Status: DC
  Administered 2023-11-04 – 2023-11-07 (×4): 500 mg via ORAL
  Filled 2023-11-04 (×4): qty 2

## 2023-11-04 MED ORDER — POTASSIUM PHOSPHATES 15 MMOLE/5ML IV SOLN
30.0000 mmol | Freq: Once | INTRAVENOUS | Status: AC
  Administered 2023-11-04: 30 mmol via INTRAVENOUS
  Filled 2023-11-04: qty 10

## 2023-11-04 NOTE — Assessment & Plan Note (Signed)
 replaced

## 2023-11-04 NOTE — Progress Notes (Signed)
 Palliative Care Progress Note, Assessment & Plan   Patient Name: Virginia Clements       Date: 11/04/2023 DOB: 08/06/1929  Age: 89 y.o. MRN#: [id number] Attending Physician: Josette Ade, MD Primary Care Physician: [place] Norleen BIRCH, MD Admit Date: 10/30/2023  Subjective: Denies pain. Slept off/on last night. No appetite but will try to eat something today. Denies CP/SOB.   HPI: 89 y.o. female  with past medical history significant for bedbound status, HFpEF, PAF not on AC, HTN, breast cancer s/p radiation (2009) and depression. Patient presented to ED from home via EMS 10/30/2023 c/o cough, diarrhea and AMS. Pt was unable to provide history on arrival. On scene, patient was found to be in moderate respiratory distress with SpO2 80-82% RA, improved to 96% on 4L O2.   ED labs significant for WBC 16.6, lactic 1.0=> 1.0, Na+ 128, K+ 2.6, GFR >60. VBG pH 7.3, CO2 70, O2 44. UA + small leukocytes, rare bacteria, WBC >50. Covid/flu/PCR negative. CXR showed cardiomegaly, vascular congestion without infiltration. CT head- no acute intracranial abnormalities.   BP 139/77, HR 50-90's, 96% 4L, RR 24, 98.4.    Patient was admitted to PCU for management of acute respiratory failure with hypoxia/hypercapnia, acute bronchitis, acute metabolic encephalopathy, COPD exacerbation, UTI, PAF and Norovirus infection.    Palliative was consulted to assist with goals of care conversations.   Summary of counseling/coordination of care: Extensive chart review completed prior to meeting patient including labs, vital signs, imaging, progress notes, orders, and available advanced directive documents from current and previous encounters.   After reviewing the patient's chart and assessing the patient at bedside, I spoke with  patient in regards to symptom management and goals of care.   Ill-appearing, elderly female resting in bed with RN at bedside. She is alert, oriented to self and location. She is not sure why she is here. She correctly names Trump as current president. Incorrectly states year as 2022 and month as June. She continues to ask why she is here during visit. Even, unlabored respirations. She is in no distress. No family present at bedside.  Discussed with patient the importance of eating small amounts of food to assist with feeling better. She agrees to graham crackers and Ensure this morning. Continues to endorse no appetite. RN at bedside encouraging PO intake as well.   Therapeutic silence and active listening provided for patient to share her  thoughts and emotions regarding current medical situation.  Emotional support provided.  Physical Exam Vitals reviewed.  Constitutional:      General: She is not in acute distress.    Appearance: She is ill-appearing.  HENT:     Head: Normocephalic and atraumatic.     Mouth/Throat:     Mouth: Mucous membranes are dry.  Pulmonary:     Effort: Pulmonary effort is normal. No respiratory distress.  Musculoskeletal:     Right lower leg: No edema.     Left lower leg: No edema.  Skin:    General: Skin is warm and dry.     Comments: Bruising, ecchymosis to LUE  Neurological:     Mental Status: She is alert. She is disoriented.     Motor: Weakness  present.  Psychiatric:        Mood and Affect: Mood normal.        Behavior: Behavior normal.   Recommendations/Plan: Continue DNR/DNI status as previously documented    Continue current supportive interventions Family declines offer for OP palliative services       Per note, plan to d/c home with [facility]   Palliative to follow peripherally as no PMT needs identified at this time. PMT remains available for needs.   Total Time 35 minutes   Time spent includes: Detailed review of medical records (labs,  imaging, vital signs), medically appropriate exam (mental status, respiratory, cardiac, skin), discussed with treatment team, counseling and educating patient, family and staff, documenting clinical information, medication management and coordination of care.     Devere Sacks, ELNITA- [facility] Palliative Medicine Team  11/04/2023 10:18 AM  Office [phone]  Pager [phone]

## 2023-11-04 NOTE — Progress Notes (Signed)
 Progress Note   Patient: Virginia Clements FMW:[id number] DOB: 21-Sep-1929 DOA: 10/30/2023     5 DOS: the patient was seen and examined on 11/04/2023   Brief hospital course: 89 y.o. female with medical history significant of wheelchair-bound, HFpEF, PAF not on AC, HTN, breast cancer status post radiation therapy, depression , who presents with cough, diarrhea, AMS.   Patient has AMS,  and is unable to provide any medical history, therefore, most of the history is obtained by discussing the case with ED physician, per EMS report, and with the nursing staff.   Per report, pt has cough in the past several days.  No fever.  Her temperature is 98.4 in ED. Chart review revealed that patient is not using oxygen and is oriented x 3 at her normal baseline.  She was found to have moderate acute respiratory distress.  He has oxygen desaturation to 80-82% on room air, which improved to 96% on 4L oxygen.  Not sure if patient has any chest pain.  She has mild wheezing on auscultation by my examination.  She has altered mental status, not arousable, not following command.  She moves all extremities on painful stimuli.  No facial droop noted.  Per report, patient has several episode of diarrhea.  No active nausea vomiting noted.  Does not seem to have abdominal tenderness on examination.  Not sure if patient has symptoms of UTI.   Data reviewed independently and ED Course: pt was found to have WBC 16.6, lactic acid 1.0 --> 1.0, negative PCR for COVID, flu and RSV, UA (cloudy appearance, small amount of leukocyte, rare bacteria, WBC> 50, squamous cell 6-10), sodium 128, potassium 2.6, GFR> 60, negative C. difficile test. Temperature normal, blood pressure 129/58, heart rate 50-90s, RR 24 --> 19.  VBG with pH 7.3, CO2 70, O2 44.  Chest x-ray showed cardiomegaly, vascular congestion without infiltration.  CT of head negative for acute intracranial abnormalities.  Patient is admitted to PCU as inpatient.  7/30.   Patient unresponsive to sternal rub.  Stool culture positive for norovirus. 7/31.  VBG shows a PCO2 of 90.  Started on BiPAP.  Patient able to answer some questions in the afternoon.  Repeat ABG showed a PCO2 of 82. 8/1.  pCO2 of 78 on VBG this morning.  Patient still not responsive enough to pass swallow evaluation.  Antibiotics changed over to Zosyn  with ESBL Klebsiella growing out of urine culture.  Patient will need intermittent BiPAP during the day and BiPAP at night. 8/2.  Mental status much improved from the last 3 days.  Continue Zosyn .  Continue BiPAP at night.  Replace phosphorus. 8/3.  Need to set up BiPAP for home prior to disposition.  Continue Zosyn .  Encourage eating.  Replace phosphorus.  Assessment and Plan: * Acute metabolic encephalopathy Mental status much improved over the last 2 days.  Likely with changing antibiotics over to Zosyn  for resistant Klebsiella ESBL urinary infection helped.  CT scan of the head negative.  MRI of the brain is negative.     Acute on chronic respiratory failure with hypoxia and hypercapnia (HCC) PCO2 of 78 on repeat venous blood gas.  Continue BiPAP at night.  Without the use of the ventilator the patient's condition will quickly deteriorate. Removal of the ventilator may cause serious harm to the patient, exacerbation of condition and hospital readmission. Bilevel/RAD with backup rate has been tried and failed to maintain or stabilize the patient. Bilevel does not meet current volume requirements. pCO2 from  ABG results on Bilevel are still elevated. Patient requires frequent durations of ventilatory support. Intermittent usage is insufficient   Urinary tract infection due to ESBL Klebsiella Changed antibiotics over to Zosyn  on 8/1.  This bacteria is resistant to most antibiotics.  COPD with acute exacerbation (HCC) Patient started on Solu-Medrol  and nebulizer treatments and empiric antibiotics.  Will get rid of Solu-Medrol  after today's  dose.  Acute on chronic diastolic CHF (congestive heart failure) (HCC) Continue Lasix  40 mg daily and metoprolol   PAF (paroxysmal atrial fibrillation) (HCC) Brief episode of atrial fibrillation.  Continue metoprolol   Hyponatremia Last sodium normal range  Hypokalemia Replaced  Benign essential HTN Restart metoprolol  and Lasix   Obesity (BMI 30-39.9) Class I obesity BMI 30.85  Hypophosphatemia Replace IV K-Phos again today and oral K-Phos.  Hypotension Resolved  Impaired fasting glucose Last hemoglobin A1c 5.1.  Infection due to Norovirus species Supportive care        Subjective: Patient again alert today.  Answering questions.  Able to straight leg raise.  No complaints.  Admitted with altered mental status and found to have multidrug-resistant ESBL Klebsiella.  Physical Exam: Vitals:   11/04/23 0500 11/04/23 0806 11/04/23 0836 11/04/23 1031  BP:   136/60 139/70  Pulse:   80 73  Resp:   18 19  Temp:   98.4 F (36.9 C) 98.2 F (36.8 C)  TempSrc:      SpO2:  100% 100% 100%  Weight: 84.1 kg     Height:       Physical Exam HENT:     Head: Normocephalic.  Eyes:     General: Lids are normal.  Cardiovascular:     Rate and Rhythm: Normal rate and regular rhythm.     Heart sounds: Normal heart sounds, S1 normal and S2 normal.  Pulmonary:     Breath sounds: Examination of the right-lower field reveals decreased breath sounds. Examination of the left-lower field reveals decreased breath sounds. Decreased breath sounds present. No wheezing, rhonchi or rales.  Abdominal:     Palpations: Abdomen is soft.     Tenderness: There is no abdominal tenderness.  Musculoskeletal:     Right lower leg: Swelling present.     Left lower leg: Swelling present.  Skin:    General: Skin is warm.     Findings: No rash.  Neurological:     Mental Status: She is alert.     Comments: Answers questions appropriately.  Able to straight leg raise bilaterally.     Data  Reviewed: Phosphorus 1.6, potassium 3.4  Family Communication: Spoke with [name]'s husband on the phone  Disposition: Status is: Inpatient Remains inpatient appropriate because: Continue IV Zosyn  today.  The ESBL Klebsiella in the urine is resistant to most antibiotics.  Will speak with pharmacy team about options for discharge.  May end up needing to do the entire course here in the hospital.  Will need to set up noninvasive ventilation for home prior to disposition.  Planned Discharge Destination: Home with Home Health    Time spent: 28 minutes  Author: Charlie Patterson, MD 11/04/2023 12:17 PM  For on call review [email or web address].

## 2023-11-04 NOTE — Progress Notes (Signed)
 OT Cancellation Note  Patient Details Name: Virginia Clements MRN: [id number] DOB: 1929-10-17   Cancelled Treatment:    Reason Eval/Treat Not Completed: OT screened, no needs identified, will sign off. Per chart review, staff reaching out to family who report pt is bed bound and has 24/7 total care at home as well as all needed equipment. Pt does not need skilled OT intervention at this time. OT to complete order.    Izetta Claude, MS, OTR/L , CBIS ascom [phone]  11/04/23, 8:18 AM

## 2023-11-04 NOTE — Progress Notes (Signed)
 Speech Language Pathology Treatment: Dysphagia  Patient Details Name: Virginia Clements MRN: [id number] DOB: Dec 21, 1929 Today's Date: 11/04/2023 Time: 9065-9057 SLP Time Calculation (min) (ACUTE ONLY): 8 min  Assessment / Plan / Recommendation Clinical Impression  Pt seen for very limited diet tolerance given pt's reduced acceptance of POs despite encouragement/education. Per chart review, temp and WBC WNL. On 4L/min O2 via [place]. Pt appeared to tolerate simulated ground/soft diet and thin liquids without overt s/sx pharyngeal dysphagia. Mildly prolonged, but functional, mastication of cracker crumbled/softened in puree which is likely due to edentulism. Pt is at increased risk of aspiration/aspiration PNA given respiratory status, age, deconditioning, hx GERD, and AMS. SLP to f/u x1 for additional diet tolerance given limited participation this date.    HPI HPI: Virginia Clements is a 89 y.o. female with medical history significant of wheelchair-bound, HFpEF, PAF not on AC, HTN, breast cancer status post radiation therapy, depression , who presents with cough, diarrhea, AMS to [facility] ED on 10/30/2023. Chest x-ray (10/30/2023) revealed Cardiomegaly with vascular congestion. No focal consolidation.; MRI on 10/30/2023 revealed 1. Age-related atrophy and advanced cerebral white matter disease. No apparent acute process. Pt was placed on BIPAP around 1157 (11/01/2023) and was taken off at 0820 (11/02/2023).      SLP Plan  Continue with current plan of care          Recommendations  Diet recommendations: Dysphagia 2 (fine chop);Thin liquid Liquids provided via: Cup (monitor straw use) Medication Administration: Whole meds with puree (vs crushed) Supervision: Staff to assist with self feeding;Full supervision/cueing for compensatory strategies Compensations: Minimize environmental distractions;Slow rate;Small sips/bites;Multiple dry swallows after each bite/sip;Follow solids with  liquid Postural Changes and/or Swallow Maneuvers: Seated upright 90 degrees;Upright 30-60 min after meal                  Oral care QID   Frequent or constant Supervision/Assistance Dysphagia, unspecified (R13.10)     Continue with current plan of care    Delon Bangs, M.S., CCC-SLP Speech-Language Pathologist [facility] [phone] [place])  Delon CHRISTELLA Bangs  11/04/2023, 10:55 AM

## 2023-11-04 NOTE — Plan of Care (Signed)
  Problem: Education: Goal: Knowledge of General Education information will improve Description: Including pain rating scale, medication(s)/side effects and non-pharmacologic comfort measures Outcome: Progressing   Problem: Health Behavior/Discharge Planning: Goal: Ability to manage health-related needs will improve Outcome: Progressing   Problem: Clinical Measurements: Goal: Ability to maintain clinical measurements within normal limits will improve Outcome: Progressing Goal: Will remain free from infection Outcome: Progressing Goal: Diagnostic test results will improve Outcome: Progressing Goal: Respiratory complications will improve Outcome: Progressing Goal: Cardiovascular complication will be avoided Outcome: Progressing   Problem: Activity: Goal: Risk for activity intolerance will decrease Outcome: Progressing   Problem: Nutrition: Goal: Adequate nutrition will be maintained Outcome: Progressing   Problem: Coping: Goal: Level of anxiety will decrease Outcome: Progressing   Problem: Elimination: Goal: Will not experience complications related to bowel motility Outcome: Progressing Goal: Will not experience complications related to urinary retention Outcome: Progressing   Problem: Pain Managment: Goal: General experience of comfort will improve and/or be controlled Outcome: Progressing   Problem: Safety: Goal: Ability to remain free from injury will improve Outcome: Progressing   Problem: Skin Integrity: Goal: Risk for impaired skin integrity will decrease Outcome: Progressing   Problem: Education: Goal: Ability to demonstrate management of disease process will improve Outcome: Progressing Goal: Ability to verbalize understanding of medication therapies will improve Outcome: Progressing Goal: Individualized Educational Video(s) Outcome: Progressing   Problem: Activity: Goal: Capacity to carry out activities will improve Outcome: Progressing    Problem: Cardiac: Goal: Ability to achieve and maintain adequate cardiopulmonary perfusion will improve Outcome: Progressing   Problem: Activity: Goal: Ability to tolerate increased activity will improve Outcome: Progressing   Problem: Clinical Measurements: Goal: Ability to maintain a body temperature in the normal range will improve Outcome: Progressing   Problem: Respiratory: Goal: Ability to maintain adequate ventilation will improve Outcome: Progressing Goal: Ability to maintain a clear airway will improve Outcome: Progressing   Problem: Education: Goal: Ability to describe self-care measures that may prevent or decrease complications (Diabetes Survival Skills Education) will improve Outcome: Progressing Goal: Individualized Educational Video(s) Outcome: Progressing   Problem: Coping: Goal: Ability to adjust to condition or change in health will improve Outcome: Progressing   Problem: Fluid Volume: Goal: Ability to maintain a balanced intake and output will improve Outcome: Progressing   Problem: Health Behavior/Discharge Planning: Goal: Ability to identify and utilize available resources and services will improve Outcome: Progressing Goal: Ability to manage health-related needs will improve Outcome: Progressing   Problem: Metabolic: Goal: Ability to maintain appropriate glucose levels will improve Outcome: Progressing   Problem: Nutritional: Goal: Maintenance of adequate nutrition will improve Outcome: Progressing Goal: Progress toward achieving an optimal weight will improve Outcome: Progressing   Problem: Skin Integrity: Goal: Risk for impaired skin integrity will decrease Outcome: Progressing   Problem: Tissue Perfusion: Goal: Adequacy of tissue perfusion will improve Outcome: Progressing

## 2023-11-05 ENCOUNTER — Encounter: Payer: Self-pay | Admitting: Internal Medicine

## 2023-11-05 DIAGNOSIS — G9341 Metabolic encephalopathy: Secondary | ICD-10-CM | POA: Diagnosis not present

## 2023-11-05 DIAGNOSIS — I9589 Other hypotension: Secondary | ICD-10-CM

## 2023-11-05 DIAGNOSIS — F3289 Other specified depressive episodes: Secondary | ICD-10-CM

## 2023-11-05 DIAGNOSIS — J441 Chronic obstructive pulmonary disease with (acute) exacerbation: Secondary | ICD-10-CM | POA: Diagnosis not present

## 2023-11-05 DIAGNOSIS — N390 Urinary tract infection, site not specified: Secondary | ICD-10-CM | POA: Diagnosis not present

## 2023-11-05 DIAGNOSIS — J9621 Acute and chronic respiratory failure with hypoxia: Secondary | ICD-10-CM | POA: Diagnosis not present

## 2023-11-05 LAB — GLUCOSE, CAPILLARY
Glucose-Capillary: 119 mg/dL — ABNORMAL HIGH (ref 70–99)
Glucose-Capillary: 148 mg/dL — ABNORMAL HIGH (ref 70–99)
Glucose-Capillary: 273 mg/dL — ABNORMAL HIGH (ref 70–99)
Glucose-Capillary: 272 mg/dL — ABNORMAL HIGH (ref 70–99)

## 2023-11-05 MED ORDER — IPRATROPIUM-ALBUTEROL 0.5-2.5 (3) MG/3ML IN SOLN
3.0000 mL | Freq: Three times a day (TID) | RESPIRATORY_TRACT | Status: DC
  Administered 2023-11-05 – 2023-11-06 (×4): 3 mL via RESPIRATORY_TRACT
  Filled 2023-11-05 (×4): qty 3

## 2023-11-05 MED ORDER — SULFAMETHOXAZOLE-TRIMETHOPRIM 800-160 MG PO TABS
1.0000 | ORAL_TABLET | Freq: Two times a day (BID) | ORAL | Status: DC
  Administered 2023-11-06 – 2023-11-07 (×3): 1 via ORAL
  Filled 2023-11-05 (×4): qty 1

## 2023-11-05 NOTE — TOC Progression Note (Addendum)
 Transition of Care [facility]) - Progression Note    Patient Details  Name: Virginia Clements MRN: [id number] Date of Birth: [birth year]  [facility]) CM/SW Contact  Lauraine JAYSON Carpen, LCSW Phone Number: 11/05/2023, 2:30 PM  Clinical Narrative:   Spoke to sister and brother-in-law. They prefer for bipap to be delivered to the home. Patient will need ambulance transport. Address on facesheet is correct. Discussed potential home health vs home hospice. After further discussion, sister does not want hospice. Brother-in-law does not feel like home health is necessary unless her caregiver cannot meet her needs. He will discuss with her.  4:01 pm: CSW left message for [employer] liaison to let him know that DME oxygen order and sats note are in. Will need oxygen delivered to the home since she will go home by ambulance.  Expected Discharge Plan and Services                                               Social Drivers of Health (SDOH) Interventions SDOH Screenings   Food Insecurity: No Food Insecurity (11/05/2023)  Housing: Unknown (11/05/2023)  Transportation Needs: No Transportation Needs (11/05/2023)  Financial Resource Strain: Low Risk  (05/31/2018)  Physical Activity: Unknown (05/31/2018)  Social Connections: Unknown (05/31/2018)  Stress: No Stress Concern Present (05/31/2018)  Tobacco Use: Medium Risk (11/05/2023)    Readmission Risk Interventions     No data to display

## 2023-11-05 NOTE — Progress Notes (Signed)
 Speech Language Pathology Treatment: Dysphagia  Patient Details Name: Virginia Clements MRN: [id number] DOB: Aug 22, 1929 Today's Date: 11/05/2023 Time: 8859-8844 SLP Time Calculation (min) (ACUTE ONLY): 15 min  Assessment / Plan / Recommendation Clinical Impression  Pt seen for follow up dysphagia intervention. RN reporting pt consuming approximately 25% of meals- no issues with intake, with pt noted to consume more with feeding assist. Pt on 3L nasal canula - no updated chest imaging. Trials completed of thin liquids. No solids per patient request. No overt or subtle s/sx pharyngeal dysphagia noted. No change to vocal quality across trials. Oral phase intact for containment and clearance. Advanced solids not trialed secondary to edentulous nature and reported minimal endurance for PO intake. Education shared regarding selection of soft solids for ease of intake. Pt reported understanding.   Recommend continuation of chopped solids and thin liquids with aspiration precautions (slow rate, small bites, elevated HOB, and alert for PO intake). Supervision/assist with intake. Meds whole vs crushed in puree. No further SLP services indicated at this time. RN aware of continuation of recommendations.    HPI HPI: Virginia Clements is a 89 y.o. female with medical history significant of wheelchair-bound, HFpEF, PAF not on AC, HTN, breast cancer status post radiation therapy, depression , who presents with cough, diarrhea, AMS to [facility] ED on 10/30/2023. Chest x-ray (10/30/2023) revealed Cardiomegaly with vascular congestion. No focal consolidation.; MRI on 10/30/2023 revealed 1. Age-related atrophy and advanced cerebral white matter disease. No apparent acute process. Pt was placed on BIPAP around 1157 (11/01/2023) and was taken off at 0820 (11/02/2023).      SLP Plan  All goals met          Recommendations  Diet recommendations: Dysphagia 2 (fine chop);Thin liquid Liquids provided via: Cup  (monitor straw use) Medication Administration: Whole meds with puree (vs crushed) Supervision: Staff to assist with self feeding;Full supervision/cueing for compensatory strategies Compensations: Minimize environmental distractions;Slow rate;Small sips/bites;Multiple dry swallows after each bite/sip;Follow solids with liquid Postural Changes and/or Swallow Maneuvers: Seated upright 90 degrees;Upright 30-60 min after meal                  Oral care QID   Frequent or constant Supervision/Assistance Dysphagia, unspecified (R13.10)     All goals met    [place] Connie Hilgert Clapp, MS, CCC-SLP Speech Language Pathologist Rehab Services; [facility] [phone] (ascom)   [place] J Clapp  11/05/2023, 12:01 PM

## 2023-11-05 NOTE — Plan of Care (Signed)
  Problem: Education: Goal: Knowledge of General Education information will improve Description: Including pain rating scale, medication(s)/side effects and non-pharmacologic comfort measures Outcome: Progressing   Problem: Health Behavior/Discharge Planning: Goal: Ability to manage health-related needs will improve Outcome: Progressing   Problem: Clinical Measurements: Goal: Ability to maintain clinical measurements within normal limits will improve Outcome: Progressing Goal: Will remain free from infection Outcome: Progressing Goal: Diagnostic test results will improve Outcome: Progressing Goal: Respiratory complications will improve Outcome: Progressing Goal: Cardiovascular complication will be avoided Outcome: Progressing   Problem: Activity: Goal: Risk for activity intolerance will decrease Outcome: Progressing   Problem: Nutrition: Goal: Adequate nutrition will be maintained Outcome: Progressing   Problem: Coping: Goal: Level of anxiety will decrease Outcome: Progressing   Problem: Elimination: Goal: Will not experience complications related to bowel motility Outcome: Progressing Goal: Will not experience complications related to urinary retention Outcome: Progressing   Problem: Pain Managment: Goal: General experience of comfort will improve and/or be controlled Outcome: Progressing   Problem: Safety: Goal: Ability to remain free from injury will improve Outcome: Progressing   Problem: Skin Integrity: Goal: Risk for impaired skin integrity will decrease Outcome: Progressing   Problem: Education: Goal: Ability to demonstrate management of disease process will improve Outcome: Progressing Goal: Ability to verbalize understanding of medication therapies will improve Outcome: Progressing Goal: Individualized Educational Video(s) Outcome: Progressing   Problem: Activity: Goal: Capacity to carry out activities will improve Outcome: Progressing    Problem: Cardiac: Goal: Ability to achieve and maintain adequate cardiopulmonary perfusion will improve Outcome: Progressing   Problem: Activity: Goal: Ability to tolerate increased activity will improve Outcome: Progressing   Problem: Clinical Measurements: Goal: Ability to maintain a body temperature in the normal range will improve Outcome: Progressing   Problem: Respiratory: Goal: Ability to maintain adequate ventilation will improve Outcome: Progressing Goal: Ability to maintain a clear airway will improve Outcome: Progressing   Problem: Education: Goal: Ability to describe self-care measures that may prevent or decrease complications (Diabetes Survival Skills Education) will improve Outcome: Progressing Goal: Individualized Educational Video(s) Outcome: Progressing   Problem: Coping: Goal: Ability to adjust to condition or change in health will improve Outcome: Progressing   Problem: Fluid Volume: Goal: Ability to maintain a balanced intake and output will improve Outcome: Progressing   Problem: Health Behavior/Discharge Planning: Goal: Ability to identify and utilize available resources and services will improve Outcome: Progressing Goal: Ability to manage health-related needs will improve Outcome: Progressing   Problem: Metabolic: Goal: Ability to maintain appropriate glucose levels will improve Outcome: Progressing   Problem: Nutritional: Goal: Maintenance of adequate nutrition will improve Outcome: Progressing Goal: Progress toward achieving an optimal weight will improve Outcome: Progressing   Problem: Skin Integrity: Goal: Risk for impaired skin integrity will decrease Outcome: Progressing   Problem: Tissue Perfusion: Goal: Adequacy of tissue perfusion will improve Outcome: Progressing

## 2023-11-05 NOTE — Progress Notes (Signed)
 Progress Note   Patient: Virginia Clements FMW:[id number] DOB: 1929-05-28 DOA: 10/30/2023     6 DOS: the patient was seen and examined on 11/05/2023   Brief hospital course: 89 y.o. female with medical history significant of wheelchair-bound, HFpEF, PAF not on AC, HTN, breast cancer status post radiation therapy, depression , who presents with cough, diarrhea, AMS.   Patient has AMS,  and is unable to provide any medical history, therefore, most of the history is obtained by discussing the case with ED physician, per EMS report, and with the nursing staff.   Per report, pt has cough in the past several days.  No fever.  Her temperature is 98.4 in ED. Chart review revealed that patient is not using oxygen and is oriented x 3 at her normal baseline.  She was found to have moderate acute respiratory distress.  He has oxygen desaturation to 80-82% on room air, which improved to 96% on 4L oxygen.  Not sure if patient has any chest pain.  She has mild wheezing on auscultation by my examination.  She has altered mental status, not arousable, not following command.  She moves all extremities on painful stimuli.  No facial droop noted.  Per report, patient has several episode of diarrhea.  No active nausea vomiting noted.  Does not seem to have abdominal tenderness on examination.  Not sure if patient has symptoms of UTI.   Data reviewed independently and ED Course: pt was found to have WBC 16.6, lactic acid 1.0 --> 1.0, negative PCR for COVID, flu and RSV, UA (cloudy appearance, small amount of leukocyte, rare bacteria, WBC> 50, squamous cell 6-10), sodium 128, potassium 2.6, GFR> 60, negative C. difficile test. Temperature normal, blood pressure 129/58, heart rate 50-90s, RR 24 --> 19.  VBG with pH 7.3, CO2 70, O2 44.  Chest x-ray showed cardiomegaly, vascular congestion without infiltration.  CT of head negative for acute intracranial abnormalities.  Patient is admitted to PCU as inpatient.  7/30.   Patient unresponsive to sternal rub.  Stool culture positive for norovirus. 7/31.  VBG shows a PCO2 of 90.  Started on BiPAP.  Patient able to answer some questions in the afternoon.  Repeat ABG showed a PCO2 of 82. 8/1.  pCO2 of 78 on VBG this morning.  Patient still not responsive enough to pass swallow evaluation.  Antibiotics changed over to Zosyn  with ESBL Klebsiella growing out of urine culture.  Patient will need intermittent BiPAP during the day and BiPAP at night. 8/2.  Mental status much improved from the last 3 days.  Continue Zosyn .  Continue BiPAP at night.  Replace phosphorus. 8/3.  Need to set up BiPAP for home prior to disposition.  Continue Zosyn .  Encourage eating.  Replace phosphorus. 8/4.  Trying to set up noninvasive ventilation for home use.  Will continue IV Zosyn  today and switch over to Bactrim  for tomorrow.  Assessment and Plan: * Acute metabolic encephalopathy Mental status much improved over the last 2 days.  Likely with changing antibiotics over to Zosyn  for resistant Klebsiella ESBL urinary infection helped.  CT scan of the head negative.  MRI of the brain is negative.     Acute on chronic respiratory failure with hypoxia and hypercapnia (HCC) PCO2 of 78 on repeat venous blood gas.  Continue BiPAP at night.  Without the use of the ventilator the patient's condition will quickly deteriorate. Removal of the ventilator may cause serious harm to the patient, exacerbation of condition and hospital readmission.  Bilevel/RAD with backup rate has been tried and failed to maintain or stabilize the patient. Bilevel does not meet current volume requirements. pCO2 from ABG results on Bilevel are still elevated. Patient requires frequent durations of ventilatory support. Intermittent usage is insufficient   Urinary tract infection due to ESBL Klebsiella Changed antibiotics over to Zosyn  on 8/1.  This bacteria is resistant to most antibiotics.  Case discussed with infectious disease  pharmacist and will continue Zosyn  today and switch over to Bactrim  for tomorrow.  COPD with acute exacerbation (HCC) Patient started on Solu-Medrol  and nebulizer treatments and empiric antibiotics.  Discontinue Solu-Medrol .  Acute on chronic diastolic CHF (congestive heart failure) (HCC) Continue Lasix  40 mg daily and metoprolol   PAF (paroxysmal atrial fibrillation) (HCC) Brief episode of atrial fibrillation.  Continue metoprolol   Hyponatremia Last sodium normal range  Hypokalemia Replaced  Benign essential HTN Restart metoprolol  and Lasix   Obesity (BMI 30-39.9) Class I obesity BMI 30.67  Hypophosphatemia replaced  Hypotension Resolved  Impaired fasting glucose Last hemoglobin A1c 5.1.  Infection due to Norovirus species Supportive care        Subjective: Patient awakened from sleep.  She answered questions appropriately.  Only did the BiPAP for short period of time last night.  Hopefully she will tolerate noninvasive ventilation better at home.  Admitted with altered mental status and found to have ESBL infection  Physical Exam: Vitals:   11/05/23 0416 11/05/23 0421 11/05/23 0848 11/05/23 1226  BP:  (!) 146/68 135/69 138/77  Pulse:  76 69 71  Resp:  20 20 20   Temp:  98.2 F (36.8 C) 98.2 F (36.8 C) 98.4 F (36.9 C)  TempSrc:  Oral    SpO2:  100% 98% 100%  Weight: 83.6 kg     Height:       Physical Exam HENT:     Head: Normocephalic.  Eyes:     General: Lids are normal.  Cardiovascular:     Rate and Rhythm: Normal rate and regular rhythm.     Heart sounds: Normal heart sounds, S1 normal and S2 normal.  Pulmonary:     Breath sounds: Examination of the right-lower field reveals decreased breath sounds. Examination of the left-lower field reveals decreased breath sounds. Decreased breath sounds present. No wheezing, rhonchi or rales.  Abdominal:     Palpations: Abdomen is soft.     Tenderness: There is no abdominal tenderness.  Musculoskeletal:      Right lower leg: Swelling present.     Left lower leg: Swelling present.  Skin:    General: Skin is warm.     Findings: No rash.  Neurological:     Mental Status: She is alert.     Comments: Answers questions appropriately.       Data Reviewed: Last 4 sugars 228, 243, 119 and 148  Family Communication: Spoke with [name]'s husband on the phone  Disposition: Status is: Inpatient Remains inpatient appropriate because: Will give IV Zosyn  today and switch over to Bactrim  for tomorrow.  Trying to set up noninvasive ventilation and qualify for home oxygen.  Planned Discharge Destination: Home with Home Health    Time spent: 28 minutes  Author: Charlie Patterson, MD 11/05/2023 1:10 PM  For on call review [email or web address].

## 2023-11-05 NOTE — Progress Notes (Signed)
 Placed patient on nighttime Bipap per order. Patient wore the bipap for approximately 30 mins and pulled off  the mask. She refused to allow RN to put the mask back on. Patient was subsequently placed back on 3L nasal cannula.

## 2023-11-05 NOTE — Progress Notes (Signed)
 Heart Failure Nurse Navigator Progress Note  PCP: Rudolpho Norleen BIRCH, MD PCP-Cardiologist: Lonni Hanson, MD Admission Diagnosis: Altered mental status, unspecified altered mental status type Diarrhea of presumed infectious origin Hypokalemia Hyponatremia Admitted from: Home via [facility]  Presentation:   Virginia Clements presented with altered mental status.  Patient also reported coughing for 3-4 days. Patient with history of atrial fibrillation, hypertension, CHF, Breast CA with radiation and depression.  EMS called by family due to patient not answering questions appropriately and very lethargic. BNP 156.4. Chest x-ray: cardiomegaly with vascular congestion without infiltration. CT head-no acute intracranial abnormalities.   Patient admitted for management of acute respiratory failure with hypoxia/hypercapnia, acute bronchitis, acute metabolic encephalopathy, COPD exacerbation, UTI, PAF and Norovirus infection.  ECHO/ LVEF: 55-60% 01/12/2023 HFpEF  Clinical Course:  Past Medical History:  Diagnosis Date   Breast cancer (HCC)    Hypertension    Hypoglycemia    PAF (paroxysmal atrial fibrillation) (HCC)    Radiation 2009     Social History   Socioeconomic History   Marital status: Widowed    Spouse name: Not on file   Number of children: 0   Years of education: Not on file   Highest education level: 8th grade  Occupational History   Occupation: retired   Tobacco Use   Smoking status: Former   Smokeless tobacco: Never  Advertising account planner   Vaping status: Never Used  Substance and Sexual Activity   Alcohol use: Not Currently   Drug use: Never   Sexual activity: Not Currently  Other Topics Concern   Not on file  Social History Narrative   Not on file   Social Drivers of Health   Financial Resource Strain: Low Risk  (05/31/2018)   Overall Financial Resource Strain (CARDIA)    Difficulty of Paying Living Expenses: Not hard at all  Food Insecurity: No Food Insecurity  (11/05/2023)   Hunger Vital Sign    Worried About Running Out of Food in the Last Year: Never true    Ran Out of Food in the Last Year: Never true  Transportation Needs: No Transportation Needs (11/05/2023)   PRAPARE - Administrator, Civil Service (Medical): No    Lack of Transportation (Non-Medical): No  Physical Activity: Unknown (05/31/2018)   Exercise Vital Sign    Days of Exercise per Week: Patient declined    Minutes of Exercise per Session: Patient declined  Stress: No Stress Concern Present (05/31/2018)   [employer] of Occupational Health - Occupational Stress Questionnaire    Feeling of Stress : Not at all  Social Connections: Unknown (05/31/2018)   Social Connection and Isolation Panel    Frequency of Communication with Friends and Family: Patient declined    Frequency of Social Gatherings with Friends and Family: Patient declined    Attends Religious Services: Patient declined    Database administrator or Organizations: Patient declined    Attends Engineer, structural: Patient declined    Marital Status: Patient declined  Education Assessment and Provision:  Detailed education and instructions provided on heart failure disease management including the following:  Signs and symptoms of Heart Failure When to call the physician Importance of daily weights Low sodium diet Fluid restriction Medication management Anticipated future follow-up appointments  Patient education given on each of the above topics.  Patient acknowledges understanding via teach back method and acceptance of all instructions.  Education Materials:  Reviewed the Living Better With Heart Failure Booklet, HF zone tool.  Patient has scale at home: Patient bed bound/unable to go to follow-up appointments  High Risk Criteria for Readmission and/or Poor Patient Outcomes: Heart failure hospital admissions (last 6 months): 0  No Show rate: 0% Difficult social situation: Bed  bound Demonstrates medication adherence: Yes.  Has a caregiver named Doyal and her Brother-In-Laws wife [name] assist patient. Primary Language: English Literacy level: 8th grade  Barriers of Care:   Patient remains bed bound.  Considerations/Referrals:   Referral made to Heart Failure Pharmacist Stewardship: No Referral made to Heart Failure CSW/NCM TOC: No Referral made to Heart & Vascular TOC clinic: No. Patient has been bed bound for 4 years per note. She states that she has used video visits for her follow-up appointments in the past with her doctor.  Items for Follow-up on DC/TOC: Diet & Fluid Restrictions Continued Heart Failure Education  Charmaine Pines, RN, BSN [facility] Heart Failure Navigator Secure Chat Only

## 2023-11-05 NOTE — Progress Notes (Addendum)
 Pt in bed. Pt is unable to ambulate. Pt oxygen saturation 77% on room air at rest. Pt oxygen saturation 93% on 2L [place] at rest. Will continue to monitor pt.

## 2023-11-06 DIAGNOSIS — G9341 Metabolic encephalopathy: Secondary | ICD-10-CM | POA: Diagnosis not present

## 2023-11-06 DIAGNOSIS — J9621 Acute and chronic respiratory failure with hypoxia: Secondary | ICD-10-CM | POA: Diagnosis not present

## 2023-11-06 DIAGNOSIS — N390 Urinary tract infection, site not specified: Secondary | ICD-10-CM | POA: Diagnosis not present

## 2023-11-06 DIAGNOSIS — E871 Hypo-osmolality and hyponatremia: Secondary | ICD-10-CM | POA: Diagnosis not present

## 2023-11-06 LAB — CBC
HCT: 37.8 % (ref 36.0–46.0)
Hemoglobin: 12.3 g/dL (ref 12.0–15.0)
MCH: 31.2 pg (ref 26.0–34.0)
MCHC: 32.5 g/dL (ref 30.0–36.0)
MCV: 95.9 fL (ref 80.0–100.0)
Platelets: 233 K/uL (ref 150–400)
RBC: 3.94 MIL/uL (ref 3.87–5.11)
RDW: 13.6 % (ref 11.5–15.5)
WBC: 11 K/uL — ABNORMAL HIGH (ref 4.0–10.5)
nRBC: 0 % (ref 0.0–0.2)

## 2023-11-06 LAB — PHOSPHORUS: Phosphorus: 3.4 mg/dL (ref 2.5–4.6)

## 2023-11-06 LAB — BASIC METABOLIC PANEL WITH GFR
Anion gap: 9 (ref 5–15)
BUN: 18 mg/dL (ref 8–23)
CO2: 40 mmol/L — ABNORMAL HIGH (ref 22–32)
Calcium: 9.9 mg/dL (ref 8.9–10.3)
Chloride: 89 mmol/L — ABNORMAL LOW (ref 98–111)
Creatinine, Ser: 0.5 mg/dL (ref 0.44–1.00)
GFR, Estimated: >60 mL/min (ref >60)
Glucose, Bld: 138 mg/dL — ABNORMAL HIGH (ref 70–99)
Potassium: 4.1 mmol/L (ref 3.5–5.1)
Sodium: 138 mmol/L (ref 135–145)

## 2023-11-06 MED ORDER — IPRATROPIUM-ALBUTEROL 0.5-2.5 (3) MG/3ML IN SOLN
3.0000 mL | Freq: Two times a day (BID) | RESPIRATORY_TRACT | Status: DC
  Administered 2023-11-07: 3 mL via RESPIRATORY_TRACT
  Filled 2023-11-06: qty 3

## 2023-11-06 MED ORDER — LOPERAMIDE HCL 2 MG PO CAPS
2.0000 mg | ORAL_CAPSULE | Freq: Three times a day (TID) | ORAL | Status: DC | PRN
  Administered 2023-11-06: 2 mg via ORAL
  Filled 2023-11-06: qty 1

## 2023-11-06 MED ORDER — LOPERAMIDE HCL 2 MG PO CAPS
4.0000 mg | ORAL_CAPSULE | Freq: Once | ORAL | Status: AC
  Administered 2023-11-06: 4 mg via ORAL
  Filled 2023-11-06: qty 2

## 2023-11-06 NOTE — Plan of Care (Signed)
  Problem: Education: Goal: Knowledge of General Education information will improve Description: Including pain rating scale, medication(s)/side effects and non-pharmacologic comfort measures Outcome: Progressing   Problem: Health Behavior/Discharge Planning: Goal: Ability to manage health-related needs will improve Outcome: Progressing   Problem: Clinical Measurements: Goal: Ability to maintain clinical measurements within normal limits will improve Outcome: Progressing Goal: Will remain free from infection Outcome: Progressing Goal: Diagnostic test results will improve Outcome: Progressing Goal: Respiratory complications will improve Outcome: Progressing Goal: Cardiovascular complication will be avoided Outcome: Progressing   Problem: Activity: Goal: Risk for activity intolerance will decrease Outcome: Progressing   Problem: Nutrition: Goal: Adequate nutrition will be maintained Outcome: Progressing   Problem: Coping: Goal: Level of anxiety will decrease Outcome: Progressing   Problem: Elimination: Goal: Will not experience complications related to bowel motility Outcome: Progressing Goal: Will not experience complications related to urinary retention Outcome: Progressing   Problem: Pain Managment: Goal: General experience of comfort will improve and/or be controlled Outcome: Progressing   Problem: Safety: Goal: Ability to remain free from injury will improve Outcome: Progressing   Problem: Skin Integrity: Goal: Risk for impaired skin integrity will decrease Outcome: Progressing   Problem: Education: Goal: Ability to demonstrate management of disease process will improve Outcome: Progressing Goal: Ability to verbalize understanding of medication therapies will improve Outcome: Progressing Goal: Individualized Educational Video(s) Outcome: Progressing   Problem: Activity: Goal: Capacity to carry out activities will improve Outcome: Progressing    Problem: Cardiac: Goal: Ability to achieve and maintain adequate cardiopulmonary perfusion will improve Outcome: Progressing   Problem: Activity: Goal: Ability to tolerate increased activity will improve Outcome: Progressing   Problem: Clinical Measurements: Goal: Ability to maintain a body temperature in the normal range will improve Outcome: Progressing   Problem: Respiratory: Goal: Ability to maintain adequate ventilation will improve Outcome: Progressing Goal: Ability to maintain a clear airway will improve Outcome: Progressing   Problem: Education: Goal: Ability to describe self-care measures that may prevent or decrease complications (Diabetes Survival Skills Education) will improve Outcome: Progressing Goal: Individualized Educational Video(s) Outcome: Progressing   Problem: Coping: Goal: Ability to adjust to condition or change in health will improve Outcome: Progressing   Problem: Fluid Volume: Goal: Ability to maintain a balanced intake and output will improve Outcome: Progressing   Problem: Health Behavior/Discharge Planning: Goal: Ability to identify and utilize available resources and services will improve Outcome: Progressing Goal: Ability to manage health-related needs will improve Outcome: Progressing   Problem: Metabolic: Goal: Ability to maintain appropriate glucose levels will improve Outcome: Progressing   Problem: Nutritional: Goal: Maintenance of adequate nutrition will improve Outcome: Progressing Goal: Progress toward achieving an optimal weight will improve Outcome: Progressing   Problem: Skin Integrity: Goal: Risk for impaired skin integrity will decrease Outcome: Progressing   Problem: Tissue Perfusion: Goal: Adequacy of tissue perfusion will improve Outcome: Progressing

## 2023-11-06 NOTE — Progress Notes (Signed)
 Progress Note   Patient: Virginia Clements FMW:[id number] DOB: October 25, 1929 DOA: 10/30/2023     7 DOS: the patient was seen and examined on 11/06/2023   Brief hospital course: 89 y.o. female with medical history significant of wheelchair-bound, HFpEF, PAF not on AC, HTN, breast cancer status post radiation therapy, depression , who presents with cough, diarrhea, AMS.   Patient has AMS,  and is unable to provide any medical history, therefore, most of the history is obtained by discussing the case with ED physician, per EMS report, and with the nursing staff.   Per report, pt has cough in the past several days.  No fever.  Her temperature is 98.4 in ED. Chart review revealed that patient is not using oxygen and is oriented x 3 at her normal baseline.  She was found to have moderate acute respiratory distress.  He has oxygen desaturation to 80-82% on room air, which improved to 96% on 4L oxygen.  Not sure if patient has any chest pain.  She has mild wheezing on auscultation by my examination.  She has altered mental status, not arousable, not following command.  She moves all extremities on painful stimuli.  No facial droop noted.  Per report, patient has several episode of diarrhea.  No active nausea vomiting noted.  Does not seem to have abdominal tenderness on examination.  Not sure if patient has symptoms of UTI.   Data reviewed independently and ED Course: pt was found to have WBC 16.6, lactic acid 1.0 --> 1.0, negative PCR for COVID, flu and RSV, UA (cloudy appearance, small amount of leukocyte, rare bacteria, WBC> 50, squamous cell 6-10), sodium 128, potassium 2.6, GFR> 60, negative C. difficile test. Temperature normal, blood pressure 129/58, heart rate 50-90s, RR 24 --> 19.  VBG with pH 7.3, CO2 70, O2 44.  Chest x-ray showed cardiomegaly, vascular congestion without infiltration.  CT of head negative for acute intracranial abnormalities.  Patient is admitted to PCU as inpatient.  7/30.   Patient unresponsive to sternal rub.  Stool culture positive for norovirus. 7/31.  VBG shows a PCO2 of 90.  Started on BiPAP.  Patient able to answer some questions in the afternoon.  Repeat ABG showed a PCO2 of 82. 8/1.  pCO2 of 78 on VBG this morning.  Patient still not responsive enough to pass swallow evaluation.  Antibiotics changed over to Zosyn  with ESBL Klebsiella growing out of urine culture.  Patient will need intermittent BiPAP during the day and BiPAP at night. 8/2.  Mental status much improved from the last 3 days.  Continue Zosyn .  Continue BiPAP at night.  Replace phosphorus. 8/3.  Need to set up BiPAP for home prior to disposition.  Continue Zosyn .  Encourage eating.  Replace phosphorus. 8/4.  Trying to set up noninvasive ventilation for home use.  Will continue IV Zosyn  today and switch over to Bactrim  for tomorrow.  Assessment and Plan: * Acute metabolic encephalopathy Mental status much improved over the last 3 days.  Likely with changing antibiotics over to Zosyn  for resistant Klebsiella ESBL urinary infection helped.  CT scan of the head negative.  MRI of the brain is negative.     Acute on chronic respiratory failure with hypoxia and hypercapnia (HCC) PCO2 of 78 on repeat venous blood gas.  Continue BiPAP at night.  Without the use of the ventilator the patient's condition will quickly deteriorate. Removal of the ventilator may cause serious harm to the patient, exacerbation of condition and hospital readmission.  Bilevel/RAD with backup rate has been tried and failed to maintain or stabilize the patient. Bilevel does not meet current volume requirements. pCO2 from ABG results on Bilevel are still elevated. Patient requires frequent durations of ventilatory support. Intermittent usage is insufficient.   Urinary tract infection due to ESBL Klebsiella Changed antibiotics over to Zosyn  on 8/1.  This bacteria is resistant to most antibiotics.  Case discussed with infectious disease  pharmacist and will continue Zosyn  today and switch over to Bactrim  for today.  COPD with acute exacerbation (HCC) Patient started on Solu-Medrol  and nebulizer treatments and empiric antibiotics.  Discontinue Solu-Medrol .  Acute on chronic diastolic CHF (congestive heart failure) (HCC) Hold Lasix  today and continue metoprolol   PAF (paroxysmal atrial fibrillation) (HCC) Brief episode of atrial fibrillation.  Continue metoprolol   Hyponatremia Last sodium normal range  Hypokalemia Replaced  Benign essential HTN Continue metoprolol  and hold Lasix  today  Obesity (BMI 30-39.9) Class I obesity BMI 30.34  Hypophosphatemia replaced  Hypotension Resolved  Impaired fasting glucose Last hemoglobin A1c 5.1.  Infection due to Norovirus species Supportive care.  With stage VII diarrhea today I will give a dose of Imodium  and monitor.  I am hesitant on sending patient home in the care of her sister with a stage VII diarrhea today.        Subjective: Patient awakened from sleep.  Feels okay.  Offers no complaints.  Had a stage VII diarrhea this morning.  Will give a dose of Imodium .  Initially admitted with altered mental status norovirus and ESBL urine infection.  Physical Exam: Vitals:   11/06/23 0518 11/06/23 0832 11/06/23 0832 11/06/23 1141  BP: (!) 134/47 (!) 130/47 (!) 130/47 (!) 114/46  Pulse: 72 72 72 62  Resp: 20     Temp: 98.3 F (36.8 C)  98.3 F (36.8 C) 98.2 F (36.8 C)  TempSrc:    Oral  SpO2: 100%     Weight:      Height:       Physical Exam HENT:     Head: Normocephalic.  Eyes:     General: Lids are normal.  Cardiovascular:     Rate and Rhythm: Normal rate and regular rhythm.     Heart sounds: Normal heart sounds, S1 normal and S2 normal.  Pulmonary:     Breath sounds: Examination of the right-lower field reveals decreased breath sounds. Examination of the left-lower field reveals decreased breath sounds. Decreased breath sounds present. No wheezing,  rhonchi or rales.  Abdominal:     Palpations: Abdomen is soft.     Tenderness: There is no abdominal tenderness.  Musculoskeletal:     Right lower leg: Swelling present.     Left lower leg: Swelling present.  Skin:    General: Skin is warm.     Findings: No rash.  Neurological:     Mental Status: She is alert.     Comments: Answers questions appropriately.       Data Reviewed: White blood cell count 11, hemoglobin 12.3, platelet count 233, potassium 4.1, creatinine 0.5, CO2 40  Family Communication: Spoke with advance husband on the phone  Disposition: Status is: Inpatient Remains inpatient appropriate because: Patient with a stage VII diarrhea this morning will give a dose of Imodium .  Hesitant on sending patient home today with her sister as a caregiver until I know we can slow down the diarrhea.  Planned Discharge Destination: Home with Home Health    Time spent: 27 minutes  Author: Charlie Patterson, MD  11/06/2023 12:16 PM  For on call review [email or web address].

## 2023-11-06 NOTE — TOC Progression Note (Signed)
 Transition of Care [facility]) - Progression Note    Patient Details  Name: Virginia Clements MRN: [id number] Date of Birth: 1929-09-23  [facility] CM/SW Contact  Lauraine JAYSON Carpen, LCSW Phone Number: 11/06/2023, 10:01 AM  Clinical Narrative:  [employer] liaison said there is a delivery driver assigned for patient's oxygen and bipap is ready whenever she gets home.   Expected Discharge Plan and Services                                               Social Drivers of Health (SDOH) Interventions SDOH Screenings   Food Insecurity: No Food Insecurity (11/05/2023)  Housing: Unknown (11/05/2023)  Transportation Needs: No Transportation Needs (11/05/2023)  Financial Resource Strain: Low Risk  (05/31/2018)  Physical Activity: Unknown (05/31/2018)  Social Connections: Unknown (05/31/2018)  Stress: No Stress Concern Present (05/31/2018)  Tobacco Use: Medium Risk (11/05/2023)    Readmission Risk Interventions     No data to display

## 2023-11-07 DIAGNOSIS — J9621 Acute and chronic respiratory failure with hypoxia: Secondary | ICD-10-CM | POA: Diagnosis not present

## 2023-11-07 DIAGNOSIS — N390 Urinary tract infection, site not specified: Secondary | ICD-10-CM | POA: Diagnosis not present

## 2023-11-07 DIAGNOSIS — G9341 Metabolic encephalopathy: Secondary | ICD-10-CM | POA: Diagnosis not present

## 2023-11-07 DIAGNOSIS — J441 Chronic obstructive pulmonary disease with (acute) exacerbation: Secondary | ICD-10-CM | POA: Diagnosis not present

## 2023-11-07 LAB — BASIC METABOLIC PANEL WITH GFR
Anion gap: 9 (ref 5–15)
BUN: 16 mg/dL (ref 8–23)
CO2: 39 mmol/L — ABNORMAL HIGH (ref 22–32)
Calcium: 9.8 mg/dL (ref 8.9–10.3)
Chloride: 87 mmol/L — ABNORMAL LOW (ref 98–111)
Creatinine, Ser: 0.41 mg/dL — ABNORMAL LOW (ref 0.44–1.00)
GFR, Estimated: >60 mL/min (ref >60)
Glucose, Bld: 99 mg/dL (ref 70–99)
Potassium: 4.5 mmol/L (ref 3.5–5.1)
Sodium: 135 mmol/L (ref 135–145)

## 2023-11-07 MED ORDER — SULFAMETHOXAZOLE-TRIMETHOPRIM 800-160 MG PO TABS: 1.0000 | ORAL_TABLET | Freq: Two times a day (BID) | ORAL | 0 refills | Status: DC

## 2023-11-07 MED ORDER — NYSTATIN 100000 UNIT/ML MT SUSP: 5.0000 mL | Freq: Four times a day (QID) | OROMUCOSAL | 0 refills | Status: AC

## 2023-11-07 MED ORDER — ENSURE PLUS HIGH PROTEIN PO LIQD: 237.0000 mL | Freq: Two times a day (BID) | ORAL | 0 refills | Status: AC

## 2023-11-07 MED ORDER — ENSURE PLUS HIGH PROTEIN PO LIQD: 237.0000 mL | Freq: Two times a day (BID) | ORAL | 0 refills | Status: DC

## 2023-11-07 MED ORDER — LOPERAMIDE HCL 2 MG PO CAPS: 2.0000 mg | ORAL_CAPSULE | Freq: Three times a day (TID) | ORAL | 0 refills | Status: DC | PRN

## 2023-11-07 MED ORDER — METOPROLOL TARTRATE 25 MG PO TABS: 25.0000 mg | ORAL_TABLET | Freq: Two times a day (BID) | ORAL | Status: AC

## 2023-11-07 MED ORDER — NYSTATIN 100000 UNIT/ML MT SUSP: 5.0000 mL | Freq: Four times a day (QID) | OROMUCOSAL | 0 refills | Status: DC

## 2023-11-07 MED ORDER — SULFAMETHOXAZOLE-TRIMETHOPRIM 800-160 MG PO TABS: 1.0000 | ORAL_TABLET | Freq: Two times a day (BID) | ORAL | 0 refills | Status: AC

## 2023-11-07 NOTE — TOC CM/SW Note (Signed)
    Durable Medical Equipment  (From admission, onward)           Start     Ordered   11/07/23 0737  For home use only DME standard manual wheelchair with seat cushion  Once       Comments: Patient suffers from being bedbound which impairs their ability to perform daily activities like grooming in the home.  A walker will not resolve issue with performing activities of daily living. A wheelchair will allow patient to safely perform daily activities. Patient can safely propel the wheelchair in the home or has a caregiver who can provide assistance. Length of need Lifetime. Accessories: elevating leg rests (ELRs), wheel locks, extensions and anti-tippers.   11/07/23 0737   11/05/23 1550  For home use only DME oxygen  Once       Question Answer Comment  Length of Need Lifetime   Mode or (Route) Nasal cannula   Liters per Minute 2   Frequency Continuous (stationary and portable oxygen unit needed)   Oxygen conserving device Yes   Oxygen delivery system Gas      11/05/23 1549

## 2023-11-07 NOTE — TOC Transition Note (Signed)
 Transition of Care [facility]) - Discharge Note   Patient Details  Name: Virginia Clements MRN: [id number] Date of Birth: May 01, 1929  [facility]) CM/SW Contact:  Lauraine JAYSON Carpen, LCSW Phone Number: 11/07/2023, 11:21 AM   Clinical Narrative:  Patient has orders to discharge home today. Family now requesting wheelchair. CSW ordered through [employer] and notified sister that it typically takes 2-3 days to deliver. Brother-in-law declined home health. [facility] set up for 2:00 so caregiver would be at the home when she arrives. No further concerns. CSW signing off.   Final next level of care: Home/Self Care Barriers to Discharge: Barriers Resolved   Patient Goals and CMS Choice            Discharge Placement                Patient to be transferred to facility by: [facility] Name of family member notified: [name] Cassette and her husband Patient and family notified of of transfer: 11/07/23  Discharge Plan and Services Additional resources added to the After Visit Summary for                  DME Arranged: Oxygen, Bipap, Wheelchair manual DME Agency: [employer] Date DME Agency Contacted: 11/07/23   Representative spoke with at DME Agency: Thomasina            Social Drivers of Health (SDOH) Interventions SDOH Screenings   Food Insecurity: No Food Insecurity (11/05/2023)  Housing: Unknown (11/05/2023)  Transportation Needs: No Transportation Needs (11/05/2023)  Financial Resource Strain: Low Risk  (05/31/2018)  Physical Activity: Unknown (05/31/2018)  Social Connections: Unknown (05/31/2018)  Stress: No Stress Concern Present (05/31/2018)  Tobacco Use: Medium Risk (11/05/2023)     Readmission Risk Interventions     No data to display

## 2023-11-07 NOTE — Plan of Care (Signed)
  Problem: Clinical Measurements: Goal: Respiratory complications will improve Outcome: Progressing   Problem: Clinical Measurements: Goal: Cardiovascular complication will be avoided Outcome: Progressing   Problem: Elimination: Goal: Will not experience complications related to bowel motility Outcome: Progressing   Problem: Safety: Goal: Ability to remain free from injury will improve Outcome: Progressing   Problem: Skin Integrity: Goal: Risk for impaired skin integrity will decrease Outcome: Progressing   

## 2023-11-07 NOTE — Discharge Instructions (Signed)
 Follow up with PCP and pick up medications from pharmacy. Make sure to take all antibiotics. Do not double up on narcotic/pain medication or drink alcohol during this time. Do not drive while taking narcotic medication. Call 911 or return to ER for life threatening issues or other concerns.

## 2023-11-07 NOTE — Discharge Summary (Signed)
 Physician Discharge Summary   Patient: Virginia Clements MRN: [id number] DOB: 1929/09/14  Admit date:     10/30/2023  Discharge date: 11/07/23  Discharge Physician: Charlie Patterson   PCP: Rudolpho Norleen BIRCH, MD   Recommendations at discharge:   Follow-up PCP 5 days  Discharge Diagnoses: Principal Problem:   Acute metabolic encephalopathy Active Problems:   Acute on chronic respiratory failure with hypoxia and hypercapnia (HCC)   Urinary tract infection due to ESBL Klebsiella   COPD with acute exacerbation (HCC)   Acute on chronic diastolic CHF (congestive heart failure) (HCC)   PAF (paroxysmal atrial fibrillation) (HCC)   Depression   Diarrhea   Hypokalemia   Hyponatremia   Benign essential HTN   Obesity (BMI 30-39.9)   Infection due to Norovirus species   Impaired fasting glucose   Hypotension   Hypophosphatemia  Resolved Problems:   * No resolved hospital problems. *  Hospital Course: 89 y.o. female with medical history significant of wheelchair-bound, HFpEF, PAF not on AC, HTN, breast cancer status post radiation therapy, depression , who presents with cough, diarrhea, AMS.   Patient has AMS,  and is unable to provide any medical history, therefore, most of the history is obtained by discussing the case with ED physician, per EMS report, and with the nursing staff.   Per report, pt has cough in the past several days.  No fever.  Her temperature is 98.4 in ED. Chart review revealed that patient is not using oxygen and is oriented x 3 at her normal baseline.  She was found to have moderate acute respiratory distress.  He has oxygen desaturation to 80-82% on room air, which improved to 96% on 4L oxygen.  Not sure if patient has any chest pain.  She has mild wheezing on auscultation by my examination.  She has altered mental status, not arousable, not following command.  She moves all extremities on painful stimuli.  No facial droop noted.  Per report, patient has several  episode of diarrhea.  No active nausea vomiting noted.  Does not seem to have abdominal tenderness on examination.  Not sure if patient has symptoms of UTI.   Data reviewed independently and ED Course: pt was found to have WBC 16.6, lactic acid 1.0 --> 1.0, negative PCR for COVID, flu and RSV, UA (cloudy appearance, small amount of leukocyte, rare bacteria, WBC> 50, squamous cell 6-10), sodium 128, potassium 2.6, GFR> 60, negative C. difficile test. Temperature normal, blood pressure 129/58, heart rate 50-90s, RR 24 --> 19.  VBG with pH 7.3, CO2 70, O2 44.  Chest x-ray showed cardiomegaly, vascular congestion without infiltration.  CT of head negative for acute intracranial abnormalities.  Patient is admitted to PCU as inpatient.  7/30.  Patient unresponsive to sternal rub.  Stool culture positive for norovirus. 7/31.  VBG shows a PCO2 of 90.  Started on BiPAP.  Patient able to answer some questions in the afternoon.  Repeat ABG showed a PCO2 of 82. 8/1.  pCO2 of 78 on VBG this morning.  Patient still not responsive enough to pass swallow evaluation.  Antibiotics changed over to Zosyn  with ESBL Klebsiella growing out of urine culture.  Patient will need intermittent BiPAP during the day and BiPAP at night. 8/2.  Mental status much improved from the last 3 days.  Continue Zosyn .  Continue BiPAP at night.  Replace phosphorus. 8/3.  Need to set up BiPAP for home prior to disposition.  Continue Zosyn .  Encourage eating.  Replace  phosphorus. 8/4.  Trying to set up noninvasive ventilation for home use.  Will continue IV Zosyn  today and switch over to Bactrim  for tomorrow. 8/5.  Patient has stage VII diarrhea today.  Will give a dose of Imodium .  Hesitant on sending patient home since she is bedbound in the care of her sister with a stage VII diarrhea this morning 8/6.  Patient will be discharged home today  Assessment and Plan: * Acute metabolic encephalopathy Mental status much improved over the last 4  days.  Initially was unresponsive to sternal rub.  Likely with changing antibiotics over to Zosyn  for resistant Klebsiella ESBL urinary infection helped.  CT scan of the head negative.  MRI of the brain is negative.     Acute on chronic respiratory failure with hypoxia and hypercapnia (HCC) PCO2 of 78 on repeat venous blood gas.  Continue BiPAP at night.  Without the use of the ventilator the patient's condition will quickly deteriorate. Removal of the ventilator may cause serious harm to the patient, exacerbation of condition and hospital readmission. Bilevel/RAD with backup rate has been tried and failed to maintain or stabilize the patient. Bilevel does not meet current volume requirements. pCO2 from ABG results on Bilevel are still elevated. Patient requires frequent durations of ventilatory support. Intermittent usage is insufficient.   Chronic oxygen 2 L set up upon discharge along with noninvasive ventilation at night.  Urinary tract infection due to ESBL Klebsiella Changed antibiotics over to Zosyn  on 8/1.  This bacteria is resistant to most antibiotics.  Case discussed with infectious disease pharmacist and will continue Zosyn  today and switch over to Bactrim  on 8/5.  Patient will finish a few more doses upon discharge  COPD with acute exacerbation [facility]) Patient started on Solu-Medrol  and nebulizer treatments and empiric antibiotics.  Discontinued Solu-Medrol  during hospital course  Acute on chronic diastolic CHF (congestive heart failure) (HCC) Hold Lasix  today and can restart as outpatient and continue metoprolol   PAF (paroxysmal atrial fibrillation) (HCC) Brief episode of atrial fibrillation.  Continue metoprolol   Hyponatremia Last sodium normal range  Hypokalemia Replaced  Benign essential HTN Continue metoprolol  and hold Lasix  today and restart as outpatient  Obesity (BMI 30-39.9) Class I obesity BMI 30.38  Hypophosphatemia replaced  Hypotension Resolved  Impaired  fasting glucose Last hemoglobin A1c 5.1.  Infection due to Norovirus species Supportive care.  Patient given a dose of Imodium  on 8/5.         Consultants: None Procedures performed: None Disposition: Home Diet recommendation:  Cardiac diet DISCHARGE MEDICATION: Allergies as of 11/07/2023       Reactions   Alendronate Other (See Comments)   Nervousness and shakiness per pt   Codeine    dizziness        Medication List     STOP taking these medications    amLODipine  10 MG tablet Commonly known as: NORVASC    doxycycline 100 MG tablet Commonly known as: VIBRA-TABS   enalapril  10 MG tablet Commonly known as: VASOTEC    mirtazapine  7.5 MG tablet Commonly known as: REMERON    naproxen  375 MG tablet Commonly known as: NAPROSYN    potassium chloride  SA 20 MEQ tablet Commonly known as: KLOR-CON  M       TAKE these medications    acetaminophen  325 MG tablet Commonly known as: TYLENOL  Take 2 tablets (650 mg total) by mouth every 6 (six) hours as needed for mild pain (or Fever >/= 101).   albuterol  108 (90 Base) MCG/ACT inhaler Commonly known as: VENTOLIN   HFA Inhale 2 puffs into the lungs every 4 (four) hours as needed for wheezing or shortness of breath.   busPIRone  10 MG tablet Commonly known as: BUSPAR  Take 10 mg by mouth 2 (two) times daily.   feeding supplement Liqd Take 237 mLs by mouth 2 (two) times daily between meals.   fexofenadine 180 MG tablet Commonly known as: ALLEGRA Take 180 mg by mouth daily.   furosemide  40 MG tablet Commonly known as: LASIX  Take 1 tablet (40 mg total) by mouth daily.   loperamide  2 MG capsule Commonly known as: IMODIUM  Take 1 capsule (2 mg total) by mouth every 8 (eight) hours as needed for diarrhea or loose stools.   metoprolol  tartrate 25 MG tablet Commonly known as: LOPRESSOR  Take 1 tablet (25 mg total) by mouth 2 (two) times daily.   nystatin  100000 UNIT/ML suspension Commonly known as: MYCOSTATIN  Take 5  mLs (500,000 Units total) by mouth 4 (four) times daily for 7 days.   sulfamethoxazole -trimethoprim  800-160 MG tablet Commonly known as: BACTRIM  DS Take 1 tablet by mouth every 12 (twelve) hours for 2 doses.   timolol  0.5 % ophthalmic gel-forming Commonly known as: TIMOPTIC -XR Place 1 drop into both eyes at bedtime.               Durable Medical Equipment  (From admission, onward)           Start     Ordered   11/07/23 0737  For home use only DME standard manual wheelchair with seat cushion  Once       Comments: Patient suffers from being bedbound which impairs their ability to perform daily activities like grooming in the home.  A walker will not resolve issue with performing activities of daily living. A wheelchair will allow patient to safely perform daily activities. Patient can safely propel the wheelchair in the home or has a caregiver who can provide assistance. Length of need Lifetime. Accessories: elevating leg rests (ELRs), wheel locks, extensions and anti-tippers.   11/07/23 0737   11/05/23 1550  For home use only DME oxygen  Once       Question Answer Comment  Length of Need Lifetime   Mode or (Route) Nasal cannula   Liters per Minute 2   Frequency Continuous (stationary and portable oxygen unit needed)   Oxygen conserving device Yes   Oxygen delivery system Gas      11/05/23 1549            Follow-up Information     Rudolpho Norleen BIRCH, MD Follow up in 5 day(s).   Specialty: Internal Medicine Contact information: [address] [facility] [place] [address] [phone]                Discharge Exam: Filed Weights   11/05/23 0416 11/06/23 0500 11/07/23 0500  Weight: 83.6 kg 82.7 kg 82.8 kg   Physical Exam HENT:     Head: Normocephalic.  Eyes:     General: Lids are normal.  Cardiovascular:     Rate and Rhythm: Normal rate and regular rhythm.     Heart sounds: Normal heart sounds, S1 normal and S2 normal.  Pulmonary:      Breath sounds: Examination of the right-lower field reveals decreased breath sounds. Examination of the left-lower field reveals decreased breath sounds. Decreased breath sounds present. No wheezing, rhonchi or rales.  Abdominal:     Palpations: Abdomen is soft.     Tenderness: There is no abdominal tenderness.  Musculoskeletal:     Right lower leg: Swelling present.     Left lower leg: Swelling present.  Skin:    General: Skin is warm.     Findings: No rash.     Comments: Bruising on arms  Neurological:     Mental Status: She is alert.     Comments: Answers questions appropriately.        Condition at discharge: stable  The results of significant diagnostics from this hospitalization (including imaging, microbiology, ancillary and laboratory) are listed below for reference.   Imaging Studies: MR BRAIN WO CONTRAST Result Date: 10/31/2023 CLINICAL DATA:  Mental status change, unknown cause EXAM: MRI HEAD WITHOUT CONTRAST TECHNIQUE: Multiplanar, multiecho pulse sequences of the brain and surrounding structures were obtained without intravenous contrast. COMPARISON:  CT the head dated October 30, 2023. FINDINGS: Brain: There is generalized cerebral volume loss present and extensive cerebral white matter disease. The study is degraded by patient motion. There is no restricted diffusion to indicate acute or recent infarction. There is no evidence of hemorrhage, mass or hydrocephalus. Vascular: Normal vascular flow voids. Skull and upper cervical spine: Normal marrow signal. No osseous lesions evident. Sinuses/Orbits: Clear paranasal sinuses. Status post bilateral lens replacement. Other: None. IMPRESSION: 1. Age-related atrophy and advanced cerebral white matter disease. No apparent acute process. Electronically Signed   By: Evalene Coho M.D.   On: 10/31/2023 12:24   CT Head Wo Contrast Result Date: 10/30/2023 CLINICAL DATA:  Mental status changes EXAM: CT HEAD WITHOUT CONTRAST TECHNIQUE:  Contiguous axial images were obtained from the base of the skull through the vertex without intravenous contrast. RADIATION DOSE REDUCTION: This exam was performed according to the departmental dose-optimization program which includes automated exposure control, adjustment of the mA and/or kV according to patient size and/or use of iterative reconstruction technique. COMPARISON:  07/16/2021 FINDINGS: Brain: There is atrophy and chronic small vessel disease changes. No acute intracranial abnormality. Specifically, no hemorrhage, hydrocephalus, mass lesion, acute infarction, or significant intracranial injury. Vascular: No hyperdense vessel or unexpected calcification. Skull: No acute calvarial abnormality. Sinuses/Orbits: Mucosal thickening in the paranasal sinuses. Air-fluid level in the left sphenoid sinus. Macerated cells clear. Other: None IMPRESSION: Atrophy, chronic microvascular disease. No acute intracranial abnormality. Acute on chronic sinusitis. Electronically Signed   By: Franky Crease M.D.   On: 10/30/2023 19:44   DG Chest Port 1 View Result Date: 10/30/2023 CLINICAL DATA:  Questionable sepsis. EXAM: PORTABLE CHEST 1 VIEW COMPARISON:  Chest radiograph dated 09/16/2023. FINDINGS: Cardiomegaly with vascular congestion. Diffuse interstitial coarsening and chronic bronchitic changes. No focal consolidation, pleural effusion, pneumothorax. Atherosclerotic calcification of the aorta. No acute osseous pathology. IMPRESSION: Cardiomegaly with vascular congestion. No focal consolidation. Electronically Signed   By: Vanetta Chou M.D.   On: 10/30/2023 16:42    Microbiology: Results for orders placed or performed during the hospital encounter of 10/30/23  Blood Culture (routine x 2)     Status: None   Collection Time: 10/30/23  4:45 PM   Specimen: BLOOD  Result Value Ref Range Status   Specimen Description BLOOD BLOOD RIGHT FOREARM  Final   Special Requests   Final    BOTTLES DRAWN AEROBIC AND  ANAEROBIC Blood Culture results may not be optimal due to an inadequate volume of blood received in culture bottles   Culture   Final    NO GROWTH 5 DAYS Performed at [facility], [address], [place], [place] [address]    Report Status 11/04/2023 FINAL  Final  Blood Culture (routine x 2)     Status: None   Collection Time: 10/30/23  4:50 PM   Specimen: BLOOD  Result Value Ref Range Status   Specimen Description BLOOD BLOOD LEFT FOREARM  Final   Special Requests   Final    BOTTLES DRAWN AEROBIC AND ANAEROBIC Blood Culture results may not be optimal due to an inadequate volume of blood received in culture bottles   Culture   Final    NO GROWTH 5 DAYS Performed at [facility], [address] [place], [place] [address]    Report Status 11/04/2023 FINAL  Final  Resp panel by RT-PCR (RSV, Flu A&B, Covid) Anterior Nasal Swab     Status: None   Collection Time: 10/30/23  5:45 PM   Specimen: Anterior Nasal Swab  Result Value Ref Range Status   SARS Coronavirus 2 by RT PCR NEGATIVE NEGATIVE Final    Comment: (NOTE) SARS-CoV-2 target nucleic acids are NOT DETECTED.  The SARS-CoV-2 RNA is generally detectable in upper respiratory specimens during the acute phase of infection. The lowest concentration of SARS-CoV-2 viral copies this assay can detect is 138 copies/mL. A negative result does not preclude SARS-Cov-2 infection and should not be used as the sole basis for treatment or other patient management decisions. A negative result may occur with  improper specimen collection/handling, submission of specimen other than nasopharyngeal swab, presence of viral mutation(s) within the areas targeted by this assay, and inadequate number of viral copies(<138 copies/mL). A negative result must be combined with clinical observations, patient history, and epidemiological information. The expected result is Negative.  Fact Sheet for Patients:   BloggerCourse.com  Fact Sheet for Healthcare Providers:  SeriousBroker.it  This test is no t yet approved or cleared by the [place]  FDA and  has been authorized for detection and/or diagnosis of SARS-CoV-2 by FDA under an Emergency Use Authorization (EUA). This EUA will remain  in effect (meaning this test can be used) for the duration of the COVID-19 declaration under Section 564(b)(1) of the Act, 21 U.S.C.section 360bbb-3(b)(1), unless the authorization is terminated  or revoked sooner.       Influenza A by PCR NEGATIVE NEGATIVE Final   Influenza B by PCR NEGATIVE NEGATIVE Final    Comment: (NOTE) The Xpert Xpress SARS-CoV-2/FLU/RSV plus assay is intended as an aid in the diagnosis of influenza from Nasopharyngeal swab specimens and should not be used as a sole basis for treatment. Nasal washings and aspirates are unacceptable for Xpert Xpress SARS-CoV-2/FLU/RSV testing.  Fact Sheet for Patients: BloggerCourse.com  Fact Sheet for Healthcare Providers: SeriousBroker.it  This test is not yet approved or cleared by the [place]  FDA and has been authorized for detection and/or diagnosis of SARS-CoV-2 by FDA under an Emergency Use Authorization (EUA). This EUA will remain in effect (meaning this test can be used) for the duration of the COVID-19 declaration under Section 564(b)(1) of the Act, 21 U.S.C. section 360bbb-3(b)(1), unless the authorization is terminated or revoked.     Resp Syncytial Virus by PCR NEGATIVE NEGATIVE Final    Comment: (NOTE) Fact Sheet for Patients: BloggerCourse.com  Fact Sheet for Healthcare Providers: SeriousBroker.it  This test is not yet approved or cleared by the [place]  FDA and has been authorized for detection and/or diagnosis of SARS-CoV-2 by FDA under an Emergency Use  Authorization (EUA). This EUA will remain in effect (meaning this test can be used) for the duration of the COVID-19 declaration under Section 564(b)(1) of the  Act, 21 U.S.C. section 360bbb-3(b)(1), unless the authorization is terminated or revoked.  Performed at [facility], [address], [place], [place] [address]   Urine Culture (for pregnant, neutropenic or urologic patients or patients with an indwelling urinary catheter)     Status: Abnormal   Collection Time: 10/30/23  5:45 PM   Specimen: Urine, Clean Catch  Result Value Ref Range Status   Specimen Description   Final    URINE, CLEAN CATCH Performed at [facility], [address], [place], [place] [address]    Special Requests   Final    NONE Performed at [facility], [address] [place], [place] [address]    Culture (A)  Final    >=100,000 COLONIES/mL KLEBSIELLA PNEUMONIAE Confirmed Extended Spectrum Beta-Lactamase Producer (ESBL).  In bloodstream infections from ESBL organisms, carbapenems are preferred over piperacillin /tazobactam. They are shown to have a lower risk of mortality.    Report Status 11/02/2023 FINAL  Final   Organism ID, Bacteria KLEBSIELLA PNEUMONIAE (A)  Final      Susceptibility   Klebsiella pneumoniae - MIC*    AMPICILLIN >=32 RESISTANT Resistant     CEFAZOLIN  >=64 RESISTANT Resistant     CEFEPIME >=32 RESISTANT Resistant     CEFTRIAXONE  >=64 RESISTANT Resistant     CIPROFLOXACIN 1 RESISTANT Resistant     GENTAMICIN >=16 RESISTANT Resistant     IMIPENEM 0.5 SENSITIVE Sensitive     NITROFURANTOIN 128 RESISTANT Resistant     TRIMETH /SULFA  40 SENSITIVE Sensitive     AMPICILLIN/SULBACTAM >=32 RESISTANT Resistant     PIP/TAZO 16 SENSITIVE Sensitive ug/mL    * >=100,000 COLONIES/mL KLEBSIELLA PNEUMONIAE  Gastrointestinal Panel by PCR , Stool     Status: Abnormal   Collection Time: 10/30/23  7:37 PM   Specimen: Stool  Result Value Ref Range Status    Campylobacter species NOT DETECTED NOT DETECTED Final   Plesimonas shigelloides NOT DETECTED NOT DETECTED Final   Salmonella species NOT DETECTED NOT DETECTED Final   Yersinia enterocolitica NOT DETECTED NOT DETECTED Final   Vibrio species NOT DETECTED NOT DETECTED Final   Vibrio cholerae NOT DETECTED NOT DETECTED Final   Enteroaggregative E coli (EAEC) NOT DETECTED NOT DETECTED Final   Enteropathogenic E coli (EPEC) NOT DETECTED NOT DETECTED Final   Enterotoxigenic E coli (ETEC) NOT DETECTED NOT DETECTED Final   Shiga like toxin producing E coli (STEC) NOT DETECTED NOT DETECTED Final   Shigella/Enteroinvasive E coli (EIEC) NOT DETECTED NOT DETECTED Final   Cryptosporidium NOT DETECTED NOT DETECTED Final   Cyclospora cayetanensis NOT DETECTED NOT DETECTED Final   Entamoeba histolytica NOT DETECTED NOT DETECTED Final   Giardia lamblia NOT DETECTED NOT DETECTED Final   Adenovirus F40/41 NOT DETECTED NOT DETECTED Final   Astrovirus NOT DETECTED NOT DETECTED Final   Norovirus GI/GII DETECTED (A) NOT DETECTED Final    Comment: RESULT CALLED TO, READ BACK BY AND VERIFIED WITH: KATHY CHERNESKY AT 2143 ON 10/30/23 BY SS    Rotavirus A NOT DETECTED NOT DETECTED Final   Sapovirus (I, II, IV, and V) NOT DETECTED NOT DETECTED Final    Comment: Performed at [facility], [address] [place], [place] [address]  C Difficile Quick Screen w PCR reflex     Status: None   Collection Time: 10/30/23  7:37 PM   Specimen: STOOL  Result Value Ref Range Status   C Diff antigen NEGATIVE NEGATIVE Final   C Diff toxin NEGATIVE NEGATIVE Final  C Diff interpretation No C. difficile detected.  Final    Comment: VALID Performed at [facility], [address] [place], [place] [address]     Labs: CBC: Recent Labs  Lab 11/03/23 [phone] 11/06/23 0449  WBC 9.2 11.0*  HGB 12.0 12.3  HCT 38.0 37.8  MCV 98.2 95.9  PLT 257 233   Basic Metabolic Panel: Recent Labs  Lab 11/01/23 0325  11/02/23 0408 11/03/23 0844 11/04/23 0622 11/06/23 0449 11/07/23 0503  NA  --  140 142 139 138 135  K  --  3.7 3.5 3.4* 4.1 4.5  CL  --  96* 92* 89* 89* 87*  CO2  --  36* 35* 40* 40* 39*  GLUCOSE  --  138* 132* 139* 138* 99  BUN  --  9 11 11 18 16   CREATININE  --  0.57 0.45 0.49 0.50 0.41*  CALCIUM   --  9.7 9.8 9.5 9.9 9.8  MG 2.2 2.2  --  2.5*  --   --   PHOS 2.9 1.9* 1.6* 1.6* 3.4  --    Liver Function Tests: Recent Labs  Lab 11/02/23 0408 11/03/23 0844  ALBUMIN 2.7* 2.9*   CBG: Recent Labs  Lab 11/04/23 2004 11/05/23 0843 11/05/23 1223 11/05/23 1638 11/05/23 2049  GLUCAP 243* 119* 148* 273* 272*    Discharge time spent: greater than 30 minutes.  Signed: Charlie Patterson, MD [facility] 11/07/2023

## 2023-11-07 NOTE — Progress Notes (Signed)
 Nsg Discharge Note  Admit Date:  @ADMITDATE @ Discharge date: @DISCHARGEDATE @   @PATIENTNAME @ to be D/C'd Home per MD order.  AVS completed.  Copy for chart, and copy for patient signed, and dated. Patient/caregiver able to verbalize understanding.   IV catheter discontinued intact. Site without signs and symptoms of complications - no redness or edema noted at insertion site, patient denies c/o pain - only slight tenderness at site.  Dressing with slight pressure applied.  D/c Instructions-Education: Discharge instructions given to EMS with verbalized understanding. EMS provided with D/C paperwork.  Patient escorted via [facility], and D/C home via EMS  Almarie DELENA Garret, RN 11/07/2023 3:34 PM

## 2024-02-09 ENCOUNTER — Emergency Department

## 2024-02-09 ENCOUNTER — Other Ambulatory Visit: Payer: Self-pay

## 2024-02-09 ENCOUNTER — Inpatient Hospital Stay
Admission: EM | Admit: 2024-02-09 | Discharge: 2024-02-12 | DRG: 152 | Disposition: A | Discharge status: Home / Self Care | Attending: Student | Admitting: Student

## 2024-02-09 DIAGNOSIS — G9341 Metabolic encephalopathy: Secondary | ICD-10-CM | POA: Diagnosis present

## 2024-02-09 DIAGNOSIS — Z555 Less than a high school diploma: Secondary | ICD-10-CM

## 2024-02-09 DIAGNOSIS — H6123 Impacted cerumen, bilateral: Secondary | ICD-10-CM | POA: Diagnosis present

## 2024-02-09 DIAGNOSIS — Z8249 Family history of ischemic heart disease and other diseases of the circulatory system: Secondary | ICD-10-CM

## 2024-02-09 DIAGNOSIS — H6692 Otitis media, unspecified, left ear: Secondary | ICD-10-CM

## 2024-02-09 DIAGNOSIS — Z87891 Personal history of nicotine dependence: Secondary | ICD-10-CM | POA: Diagnosis not present

## 2024-02-09 DIAGNOSIS — H65192 Other acute nonsuppurative otitis media, left ear: Secondary | ICD-10-CM | POA: Diagnosis present

## 2024-02-09 DIAGNOSIS — N390 Urinary tract infection, site not specified: Secondary | ICD-10-CM | POA: Diagnosis present

## 2024-02-09 DIAGNOSIS — Z1152 Encounter for screening for COVID-19: Secondary | ICD-10-CM | POA: Diagnosis not present

## 2024-02-09 DIAGNOSIS — Z888 Allergy status to other drugs, medicaments and biological substances status: Secondary | ICD-10-CM

## 2024-02-09 DIAGNOSIS — K5641 Fecal impaction: Secondary | ICD-10-CM | POA: Diagnosis present

## 2024-02-09 DIAGNOSIS — J449 Chronic obstructive pulmonary disease, unspecified: Secondary | ICD-10-CM | POA: Diagnosis present

## 2024-02-09 DIAGNOSIS — Z66 Do not resuscitate: Secondary | ICD-10-CM | POA: Diagnosis present

## 2024-02-09 DIAGNOSIS — I480 Paroxysmal atrial fibrillation: Secondary | ICD-10-CM | POA: Diagnosis present

## 2024-02-09 DIAGNOSIS — Z853 Personal history of malignant neoplasm of breast: Secondary | ICD-10-CM

## 2024-02-09 DIAGNOSIS — I5032 Chronic diastolic (congestive) heart failure: Secondary | ICD-10-CM | POA: Diagnosis present

## 2024-02-09 DIAGNOSIS — E876 Hypokalemia: Secondary | ICD-10-CM | POA: Diagnosis present

## 2024-02-09 DIAGNOSIS — E873 Alkalosis: Secondary | ICD-10-CM | POA: Diagnosis present

## 2024-02-09 DIAGNOSIS — N3001 Acute cystitis with hematuria: Secondary | ICD-10-CM | POA: Diagnosis present

## 2024-02-09 DIAGNOSIS — I110 Hypertensive heart disease with heart failure: Secondary | ICD-10-CM | POA: Diagnosis present

## 2024-02-09 DIAGNOSIS — B999 Unspecified infectious disease: Secondary | ICD-10-CM

## 2024-02-09 DIAGNOSIS — F32A Depression, unspecified: Secondary | ICD-10-CM | POA: Diagnosis present

## 2024-02-09 DIAGNOSIS — Z87440 Personal history of urinary (tract) infections: Secondary | ICD-10-CM

## 2024-02-09 DIAGNOSIS — Z1611 Resistance to penicillins: Secondary | ICD-10-CM | POA: Diagnosis present

## 2024-02-09 DIAGNOSIS — Z885 Allergy status to narcotic agent status: Secondary | ICD-10-CM

## 2024-02-09 DIAGNOSIS — B961 Klebsiella pneumoniae [K. pneumoniae] as the cause of diseases classified elsewhere: Secondary | ICD-10-CM | POA: Diagnosis present

## 2024-02-09 DIAGNOSIS — Z7401 Bed confinement status: Secondary | ICD-10-CM | POA: Diagnosis not present

## 2024-02-09 DIAGNOSIS — E871 Hypo-osmolality and hyponatremia: Secondary | ICD-10-CM | POA: Diagnosis present

## 2024-02-09 DIAGNOSIS — Z79899 Other long term (current) drug therapy: Secondary | ICD-10-CM

## 2024-02-09 DIAGNOSIS — Z923 Personal history of irradiation: Secondary | ICD-10-CM

## 2024-02-09 LAB — COMPREHENSIVE METABOLIC PANEL WITH GFR
ALT: 10 U/L (ref 0–44)
AST: 17 U/L (ref 15–41)
Albumin: 3.6 g/dL (ref 3.5–5.0)
Alkaline Phosphatase: 75 U/L (ref 38–126)
Anion gap: 15 (ref 5–15)
BUN: 9 mg/dL (ref 8–23)
CO2: 26 mmol/L (ref 22–32)
Calcium: 10.5 mg/dL — ABNORMAL HIGH (ref 8.9–10.3)
Chloride: 97 mmol/L — ABNORMAL LOW (ref 98–111)
Creatinine, Ser: 0.67 mg/dL (ref 0.44–1.00)
GFR, Estimated: >60 mL/min (ref >60)
Glucose, Bld: 132 mg/dL — ABNORMAL HIGH (ref 70–99)
Potassium: 2.9 mmol/L — ABNORMAL LOW (ref 3.5–5.1)
Sodium: 138 mmol/L (ref 135–145)
Total Bilirubin: 1.4 mg/dL — ABNORMAL HIGH (ref 0.0–1.2)
Total Protein: 7 g/dL (ref 6.5–8.1)

## 2024-02-09 LAB — URINALYSIS, ROUTINE W REFLEX MICROSCOPIC
Bilirubin Urine: NEGATIVE
Glucose, UA: NEGATIVE mg/dL
Hgb urine dipstick: NEGATIVE
Ketones, ur: 5 mg/dL — AB
Nitrite: NEGATIVE
Protein, ur: 30 mg/dL — AB
Specific Gravity, Urine: 1.009 (ref 1.005–1.030)
WBC, UA: >50 WBC/hpf (ref 0–5)
pH: 7 (ref 5.0–8.0)

## 2024-02-09 LAB — RESP PANEL BY RT-PCR (RSV, FLU A&B, COVID)  RVPGX2
Influenza A by PCR: NEGATIVE
Influenza B by PCR: NEGATIVE
Resp Syncytial Virus by PCR: NEGATIVE
SARS Coronavirus 2 by RT PCR: NEGATIVE

## 2024-02-09 LAB — CBC
HCT: 38.6 % (ref 36.0–46.0)
Hemoglobin: 13.2 g/dL (ref 12.0–15.0)
MCH: 31.7 pg (ref 26.0–34.0)
MCHC: 34.2 g/dL (ref 30.0–36.0)
MCV: 92.8 fL (ref 80.0–100.0)
Platelets: 285 K/uL (ref 150–400)
RBC: 4.16 MIL/uL (ref 3.87–5.11)
RDW: 13.1 % (ref 11.5–15.5)
WBC: 13.4 K/uL — ABNORMAL HIGH (ref 4.0–10.5)
nRBC: 0 % (ref 0.0–0.2)

## 2024-02-09 LAB — BLOOD GAS, VENOUS
Acid-base deficit: 2 mmol/L (ref 0.0–2.0)
Bicarbonate: 20.4 mmol/L (ref 20.0–28.0)
O2 Saturation: 93.4 %
Patient temperature: 37
pCO2, Ven: 28 mmHg — ABNORMAL LOW (ref 44–60)
pH, Ven: 7.47 — ABNORMAL HIGH (ref 7.25–7.43)
pO2, Ven: 56 mmHg — ABNORMAL HIGH (ref 32–45)

## 2024-02-09 LAB — AMMONIA: Ammonia: 20 umol/L (ref 9–35)

## 2024-02-09 LAB — MAGNESIUM: Magnesium: 1.8 mg/dL (ref 1.7–2.4)

## 2024-02-09 LAB — PHOSPHORUS: Phosphorus: 1.3 mg/dL — ABNORMAL LOW (ref 2.5–4.6)

## 2024-02-09 MED ORDER — POTASSIUM PHOSPHATES 15 MMOLE/5ML IV SOLN
30.0000 mmol | Freq: Once | INTRAVENOUS | Status: AC
  Administered 2024-02-09: 30 mmol via INTRAVENOUS
  Filled 2024-02-09: qty 10

## 2024-02-09 MED ORDER — SODIUM CHLORIDE 0.9 % IV SOLN
2.0000 g | INTRAVENOUS | Status: DC
  Administered 2024-02-10 – 2024-02-11 (×2): 2 g via INTRAVENOUS
  Filled 2024-02-09 (×3): qty 20

## 2024-02-09 MED ORDER — BUSPIRONE HCL 10 MG PO TABS
10.0000 mg | ORAL_TABLET | Freq: Two times a day (BID) | ORAL | Status: DC
  Administered 2024-02-09 – 2024-02-12 (×7): 10 mg via ORAL
  Filled 2024-02-09: qty 2
  Filled 2024-02-09 (×6): qty 1

## 2024-02-09 MED ORDER — POLYETHYLENE GLYCOL 3350 17 G PO PACK
17.0000 g | PACK | Freq: Two times a day (BID) | ORAL | Status: DC
  Administered 2024-02-09 – 2024-02-11 (×4): 17 g via ORAL
  Filled 2024-02-09 (×5): qty 1

## 2024-02-09 MED ORDER — SODIUM CHLORIDE 0.9 % IV SOLN: 250.0000 mL | INTRAVENOUS | Status: AC | PRN

## 2024-02-09 MED ORDER — POTASSIUM CHLORIDE 20 MEQ PO PACK
40.0000 meq | PACK | ORAL | Status: AC
  Administered 2024-02-09 (×2): 40 meq via ORAL
  Filled 2024-02-09 (×2): qty 2

## 2024-02-09 MED ORDER — BISACODYL 10 MG RE SUPP: 10.0000 mg | Freq: Every day | RECTAL | Status: DC | PRN

## 2024-02-09 MED ORDER — METOPROLOL TARTRATE 25 MG PO TABS
25.0000 mg | ORAL_TABLET | Freq: Two times a day (BID) | ORAL | Status: DC
  Administered 2024-02-09 (×2): 25 mg via ORAL
  Filled 2024-02-09 (×2): qty 1

## 2024-02-09 MED ORDER — SODIUM CHLORIDE 0.9 % IV SOLN: INTRAVENOUS | Status: AC

## 2024-02-09 MED ORDER — POTASSIUM CHLORIDE CRYS ER 20 MEQ PO TBCR
40.0000 meq | EXTENDED_RELEASE_TABLET | Freq: Once | ORAL | Status: AC
  Administered 2024-02-09: 40 meq via ORAL
  Filled 2024-02-09: qty 2

## 2024-02-09 MED ORDER — MINERAL OIL RE ENEM
1.0000 | ENEMA | Freq: Once | RECTAL | Status: AC
  Administered 2024-02-09: 1 via RECTAL

## 2024-02-09 MED ORDER — SODIUM CHLORIDE 0.9 % IV SOLN
2.0000 g | Freq: Once | INTRAVENOUS | Status: AC
  Administered 2024-02-09: 2 g via INTRAVENOUS
  Filled 2024-02-09: qty 20

## 2024-02-09 MED ORDER — ONDANSETRON HCL 4 MG/2ML IJ SOLN: 4.0000 mg | Freq: Four times a day (QID) | INTRAMUSCULAR | Status: DC | PRN

## 2024-02-09 MED ORDER — ENSURE PLUS HIGH PROTEIN PO LIQD
237.0000 mL | Freq: Two times a day (BID) | ORAL | Status: DC
  Administered 2024-02-09 – 2024-02-12 (×6): 237 mL via ORAL

## 2024-02-09 MED ORDER — ENOXAPARIN SODIUM 40 MG/0.4ML IJ SOSY
40.0000 mg | PREFILLED_SYRINGE | INTRAMUSCULAR | Status: DC
  Administered 2024-02-09 – 2024-02-11 (×3): 40 mg via SUBCUTANEOUS
  Filled 2024-02-09 (×3): qty 0.4

## 2024-02-09 MED ORDER — ALBUTEROL SULFATE (2.5 MG/3ML) 0.083% IN NEBU: 3.0000 mL | INHALATION_SOLUTION | RESPIRATORY_TRACT | Status: DC | PRN

## 2024-02-09 MED ORDER — SODIUM CHLORIDE 0.9% FLUSH
3.0000 mL | Freq: Two times a day (BID) | INTRAVENOUS | Status: DC
  Administered 2024-02-09 – 2024-02-11 (×5): 3 mL via INTRAVENOUS

## 2024-02-09 MED ORDER — ACETAMINOPHEN 325 MG PO TABS
650.0000 mg | ORAL_TABLET | Freq: Four times a day (QID) | ORAL | Status: DC | PRN
  Administered 2024-02-11: 650 mg via ORAL
  Filled 2024-02-09: qty 2

## 2024-02-09 MED ORDER — ONDANSETRON HCL 4 MG PO TABS: 4.0000 mg | ORAL_TABLET | Freq: Four times a day (QID) | ORAL | Status: DC | PRN

## 2024-02-09 MED ORDER — BISACODYL 5 MG PO TBEC
10.0000 mg | DELAYED_RELEASE_TABLET | Freq: Every day | ORAL | Status: DC
  Administered 2024-02-09 – 2024-02-11 (×3): 10 mg via ORAL
  Filled 2024-02-09 (×3): qty 2

## 2024-02-09 MED ORDER — ACETAMINOPHEN 650 MG RE SUPP: 650.0000 mg | Freq: Four times a day (QID) | RECTAL | Status: DC | PRN

## 2024-02-09 MED ORDER — SODIUM CHLORIDE 0.9% FLUSH
3.0000 mL | Freq: Two times a day (BID) | INTRAVENOUS | Status: DC
  Administered 2024-02-09 – 2024-02-11 (×4): 3 mL via INTRAVENOUS

## 2024-02-09 MED ORDER — SODIUM CHLORIDE 0.9% FLUSH
3.0000 mL | INTRAVENOUS | Status: DC | PRN
  Administered 2024-02-09: 3 mL via INTRAVENOUS

## 2024-02-09 MED ORDER — IOHEXOL 300 MG/ML  SOLN
100.0000 mL | Freq: Once | INTRAMUSCULAR | Status: AC | PRN
  Administered 2024-02-09: 100 mL via INTRAVENOUS

## 2024-02-09 NOTE — H&P (Signed)
 [facility] History and Physical   Patient: Virginia Clements FMW:[id number]   PCP: Rudolpho Norleen BIRCH, MD DOB: [birth year]   DOA: 02/09/2024   DOS: 02/09/2024   DOS: the patient was seen and examined on 02/09/2024  Patient coming from: The patient is coming from Home  Chief Complaint: AMS, hallucinations and vomiting  HPI: Virginia Clements is a 89 y.o. female with PMH of COPD, CHF, paroxysmal A-fib not on [facility], currently sinus rhythm, as reviewed from EMR, presented at [facility] ED from home with complaining of altered mental status and hallucination for past few days.  Patient's sister noticed that patient is talking out of her mind, seeing people which are not there, also had an episode of vomiting yesterday and today.  Patient herself denied any complaints, on my exam patient was sleepy.  Most of the HPI got from the ED doc and EMR chart review.   ED Course: VS temp 98.1, HR 86, RR 18, BP 145/76, 94% on RA BMP: Potassium 2.9 hypokalemia, chloride 97, glucose 132, calcium  10.5 Hypophosphatemia, Phos 1.3, T. bili 1.4 CBC: Mild leukocytosis WBC 13.4 elevated UA positive, LE large, nitrate negative, bacteria many, WBC >50  CTH: 1. New complete left middle ear and mastoid opacification. Consider acute otitis media with mastoid effusion. No complicating features are evident. Contralateral middle ear and paranasal sinuses are well aerated. 2. No acute intracranial abnormality. Chronic small vessel disease and brain volume loss.  CT a/p: 1. No acute findings in the abdomen or pelvis. 2. Large volume of desiccated stool in the rectum. Correlate for any clinical signs or symptoms of rectal impaction.   Review of Systems: as mentioned in the history of present illness.  All other systems reviewed and are negative.  Past Medical History:  Diagnosis Date   Breast cancer (HCC)    Hypertension    Hypoglycemia    PAF (paroxysmal atrial fibrillation) (HCC)    Radiation 2009   Past  Surgical History:  Procedure Laterality Date   BREAST BIOPSY Right 2009   Social History:  reports that she has quit smoking. She has never used smokeless tobacco. She reports that she does not currently use alcohol. She reports that she does not use drugs.  Allergies  Allergen Reactions   Alendronate Other (See Comments)    Nervousness and shakiness per pt   Codeine     dizziness     Family history reviewed and not pertinent Family History  Problem Relation Age of Onset   Hypertension Mother      Prior to Admission medications   Medication Sig Start Date End Date Taking? Authorizing Provider  acetaminophen  (TYLENOL ) 325 MG tablet Take 2 tablets (650 mg total) by mouth every 6 (six) hours as needed for mild pain (or Fever >/= 101). 06/04/18   Vachhani, Vaibhavkumar, MD  albuterol  (VENTOLIN  HFA) 108 (90 Base) MCG/ACT inhaler Inhale 2 puffs into the lungs every 4 (four) hours as needed for wheezing or shortness of breath. 03/20/22   Alexander, Natalie, DO  busPIRone  (BUSPAR ) 10 MG tablet Take 10 mg by mouth 2 (two) times daily.    [provider]  feeding supplement (ENSURE PLUS HIGH PROTEIN) LIQD Take 237 mLs by mouth 2 (two) times daily between meals. 11/07/23   Josette Ade, MD  fexofenadine (ALLEGRA) 180 MG tablet Take 180 mg by mouth daily.    [provider]  furosemide  (LASIX ) 40 MG tablet Take 1 tablet (40 mg total) by mouth daily. 01/16/23  Dorinda Drue DASEN, MD  loperamide  (IMODIUM ) 2 MG capsule Take 1 capsule (2 mg total) by mouth every 8 (eight) hours as needed for diarrhea or loose stools. 11/07/23   Josette Ade, MD  metoprolol  tartrate (LOPRESSOR ) 25 MG tablet Take 1 tablet (25 mg total) by mouth 2 (two) times daily. 11/07/23   Josette Ade, MD  timolol  (TIMOPTIC -XR) 0.5 % ophthalmic gel-forming Place 1 drop into both eyes at bedtime. 07/06/14   [provider]    Physical Exam: Vitals:   02/09/24 0730 02/09/24 0748 02/09/24 0900 02/09/24  0930  BP: (!) 148/70  (!) 154/93 (!) 144/72  Pulse: 80  87 75  Resp:   (!) 21 17  Temp:      TempSrc:      SpO2: 95%  100% 98%  Weight:  73.6 kg    Height:  5' 5 (1.651 m)      General: lethargic and oriented to place and person. Appear in no distress, sleepy, dozing off Eyes: PERRLA, Conjunctiva normal ENT: Oral Mucosa Clear, dry  Neck: no JVD, no Abnormal Mass Or lumps Cardiovascular: S1 and S2 Present, no Murmur, peripheral pulses symmetrical Respiratory: good respiratory effort, Bilateral Air entry equal and Decreased, no signs of accessory muscle use, Clear to Auscultation, no Crackles, no wheezes Abdomen: Bowel Sound present, Soft and no tenderness, no hernia Skin: no rashes  Extremities: mild Pedal edema, no calf tenderness Neurologic: without any new focal findings Gait not checked due to patient safety concerns  Data Reviewed: I have personally reviewed and interpreted labs, imaging as discussed below.  CBC: Recent Labs  Lab 02/09/24 0722  WBC 13.4*  HGB 13.2  HCT 38.6  MCV 92.8  PLT 285   Basic Metabolic Panel: Recent Labs  Lab 02/09/24 0722  NA 138  K 2.9*  CL 97*  CO2 26  GLUCOSE 132*  BUN 9  CREATININE 0.67  CALCIUM  10.5*   GFR: Estimated Creatinine Clearance: 46 mL/min (by C-G formula based on SCr of 0.67 mg/dL). Liver Function Tests: Recent Labs  Lab 02/09/24 0722  AST 17  ALT 10  ALKPHOS 75  BILITOT 1.4*  PROT 7.0  ALBUMIN 3.6   No results for input(s): LIPASE, AMYLASE in the last 168 hours. Recent Labs  Lab 02/09/24 0729  AMMONIA 20   Coagulation Profile: No results for input(s): INR, PROTIME in the last 168 hours. Cardiac Enzymes: No results for input(s): CKTOTAL, CKMB, CKMBINDEX, TROPONINI in the last 168 hours. BNP (last 3 results) No results for input(s): PROBNP in the last 8760 hours. HbA1C: No results for input(s): HGBA1C in the last 72 hours. CBG: No results for input(s): GLUCAP in the last 168  hours. Lipid Profile: No results for input(s): CHOL, HDL, LDLCALC, TRIG, CHOLHDL, LDLDIRECT in the last 72 hours. Thyroid Function Tests: No results for input(s): TSH, T4TOTAL, FREET4, T3FREE, THYROIDAB in the last 72 hours. Anemia Panel: No results for input(s): VITAMINB12, FOLATE, FERRITIN, TIBC, IRON, RETICCTPCT in the last 72 hours. Urine analysis:    Component Value Date/Time   COLORURINE YELLOW (A) 02/09/2024 0836   APPEARANCEUR CLOUDY (A) 02/09/2024 0836   APPEARANCEUR Clear 08/10/2011 0226   LABSPEC 1.009 02/09/2024 0836   LABSPEC 1.025 08/10/2011 0226   PHURINE 7.0 02/09/2024 0836   GLUCOSEU NEGATIVE 02/09/2024 0836   GLUCOSEU Negative 08/10/2011 0226   HGBUR NEGATIVE 02/09/2024 0836   BILIRUBINUR NEGATIVE 02/09/2024 0836   BILIRUBINUR Negative 08/10/2011 0226   KETONESUR 5 (A) 02/09/2024 0836   PROTEINUR  30 (A) 02/09/2024 0836   NITRITE NEGATIVE 02/09/2024 0836   LEUKOCYTESUR LARGE (A) 02/09/2024 0836   LEUKOCYTESUR Negative 08/10/2011 0226    Radiological Exams on Admission: CT ABDOMEN PELVIS W CONTRAST Result Date: 02/09/2024 EXAM: CT ABDOMEN AND PELVIS WITH CONTRAST 02/09/2024 09:04:40 AM TECHNIQUE: CT of the abdomen and pelvis was performed with the administration of 100 mL of iohexol  (OMNIPAQUE ) 300 MG/ML solution. Multiplanar reformatted images are provided for review. Automated exposure control, iterative reconstruction, and/or weight-based adjustment of the mA/kV was utilized to reduce the radiation dose to as low as reasonably achievable. COMPARISON: 08/27/2020 CLINICAL HISTORY: Recurrent vomiting, altered mental status, leukocytosis, mild bilirubin elevation--eval for underlying infection. FINDINGS: LOWER CHEST: Subpleural honeycombing and interstitial reticulation identified within the lung bases suggestive of chronic interstitial lung disease. LIVER: The liver is unremarkable. GALLBLADDER AND BILE DUCTS: Mild distention of the  gallbladder. No wall thickening, pericholecystic inflammation or stones noted. Common bile duct measures up to 1 cm, image 44/5. No signs of choledocholithiasis. SPLEEN: The spleen is within normal limits in size and appearance. PANCREAS: Pancreas is normal in size and contour without focal lesion or ductal dilatation. ADRENAL GLANDS: Normal left adrenal gland. Previously noted right adrenal gland adenoma is unchanged measuring 2.1 cm, image 18/2. No follow-up imaging recommended. KIDNEYS, URETERS AND BLADDER: No stones in the kidneys or ureters. No hydronephrosis. No perinephric or periureteral stranding. Urinary bladder is unremarkable. GI AND BOWEL: Stomach demonstrates no acute abnormality. The appendix is not visualized. No secondary signs of acute appendicitis noted. Mild colonic stool burden. Sigmoid diverticulosis without signs of acute diverticulitis. Signs of previous sigmoid resection with anastomotic suture chain in the left lower quadrant of the abdomen. Large volume of desiccated stool is identified within the rectum. Atherosclerotic calcifications. PERITONEUM AND RETROPERITONEUM: No ascites. No free air. VASCULATURE: Aorta is normal in caliber. LYMPH NODES: No lymphadenopathy. REPRODUCTIVE ORGANS: Status post hysterectomy. No adnexal mass. BONES AND SOFT TISSUES: Ventral abdominal wall hernia repair. Previous open reduction and internal fixation of the right femur. Remote healed left inferior pubic rami fracture. Bones are diffusely osteopenic. Chronic compression fractures are again seen involving the T12, L1, and L5 vertebrae. No focal soft tissue abnormality. IMPRESSION: 1. No acute findings in the abdomen or pelvis. 2. Large volume of desiccated stool in the rectum. Correlate for any clinical signs or symptoms of rectal impaction. Electronically signed by: Waddell Calk MD 02/09/2024 09:42 AM EST RP Workstation: [id number]   CT HEAD WO CONTRAST ( ) Result Date: 02/09/2024 EXAM: CT HEAD  WITHOUT CONTRAST 02/09/2024 08:06:21 AM TECHNIQUE: CT of the head was performed without the administration of intravenous contrast. Automated exposure control, iterative reconstruction, and/or weight based adjustment of the mA/kV was utilized to reduce the radiation dose to as low as reasonably achievable. COMPARISON: Brain MRI 10/31/2023. Head CT 10/30/2023. CLINICAL HISTORY: 89 year old female with altered mental status. FINDINGS: BRAIN AND VENTRICLES: No acute hemorrhage. No evidence of acute infarct. Stable brain volume. Nonspecific ventriculomegaly might be ex vacuo in nature. Advanced chronic cerebral white matter disease with hypodensity. Chronic lacunar infarct left corona radiata. No cortical encephalomalacia by CT. Calcified atherosclerosis. No suspicious intracranial vascular hyperdensity. No extra-axial collection. No mass effect or midline shift. ORBITS: No acute abnormality. SINUSES: Left middle ear and mastoid opacification is new and extensive. Negative visible pharynx. Contralateral right tympanic cavity and mastoids are clear. Paranasal sinuses are better aerated compared to July. SOFT TISSUES AND SKULL: No acute soft tissue abnormality. No skull fracture. No bone erosion or  acute osseous abnormality is identified. Chronic skull osteopenia. IMPRESSION: 1. New complete left middle ear and mastoid opacification. Consider acute otitis media with mastoid effusion. No complicating features are evident. Contralateral middle ear and paranasal sinuses are well aerated. 2. No acute intracranial abnormality. Chronic small vessel disease and brain volume loss. Electronically signed by: Helayne Hurst MD 02/09/2024 08:17 AM EST RP Workstation: [id number]   DG Chest Portable 1 View Result Date: 02/09/2024 EXAM: 1 VIEW(S) XRAY OF THE CHEST 02/09/2024 07:43:36 AM COMPARISON: 10/30/2023 CLINICAL HISTORY: elderly AMS, eval for underlying pna, chronic pulmonary fibrosis suspected. FINDINGS: LUNGS AND PLEURA:  pulmonary fibrosis suspected. Chronic lung disease/interstitial coarsening and low lung volumes. Stable coarse interstitial opacity in right upper lobe. No pulmonary edema. No pleural effusion. No pneumothorax. HEART AND MEDIASTINUM: Stable cardiomegaly and mediastinal contours. Calcified aortic atherosclerosis. BONES AND SOFT TISSUES: Osteopenia. IMPRESSION: 1. Chronic lung disease with stable low lung volumes. No superimposed acute cardiopulmonary abnormality. Electronically signed by: Helayne Hurst MD 02/09/2024 07:59 AM EST RP Workstation: [id number]    EKG: Pending  Noticed sinus rhythm on the monitor   I reviewed all nursing notes, pharmacy notes, vitals, pertinent old records.  Assessment/Plan Principal Problem:   UTI (urinary tract infection)  # Metabolic encephalopathy Patient presented with altered mental status and visual hallucination Secondary to UTI and acute otitis media Continue supportive care Treat primary causes below Continue IV fluid for hydration  # UTI (urinary tract infection) UA positive Continue ceftriaxone  2 g IV daily Follow urine culture   # Acute otitis media Continue ceftriaxone  as above   # Hypokalemia: Potassium repleted. # Hypophosphatemia: Phos repleted. Monitor electrolytes daily and replete as needed.  # Constipation with fecal impaction Mineral oil enema x 1 dose given Continue oral laxatives  # Hypertension Continued Lopressor  25 mg p.o. twice daily Monitor BP and titrate medications according  # Depression, continued BuSpar    Nutrition: Regular diet DVT Prophylaxis: Subcutaneous Lovenox   Advance goals of care discussion: DNR-limited  Consults: None  Family Communication: family was not present at bedside, at the time of interview.  Opportunity was given to ask question and all questions were answered satisfactorily.  Disposition: Admitted as inpatient, telemetry unit. Likely to be discharged TBD after PT and OT eval, in few  days when stable  I have discussed plan of care as described above with RN and patient/family.  Severity of Illness: The appropriate patient status for this patient is INPATIENT. Inpatient status is judged to be reasonable and necessary in order to provide the required intensity of service to ensure the patient's safety. The patient's presenting symptoms, physical exam findings, and initial radiographic and laboratory data in the context of their chronic comorbidities is felt to place them at high risk for further clinical deterioration. Furthermore, it is not anticipated that the patient will be medically stable for discharge from the hospital within 2 midnights of admission.   * I certify that at the point of admission it is my clinical judgment that the patient will require inpatient hospital care spanning beyond 2 midnights from the point of admission due to high intensity of service, high risk for further deterioration and high frequency of surveillance required.*   Author: ELVAN SOR, MD [facility] 02/09/2024 10:25 AM   To reach On-call, see care teams to locate the attending and reach out to them via [email or web address]. If 7PM-7AM, please contact night-coverage If you still have difficulty reaching the attending provider, please page the [facility] (Director on Call) for [facility]  on amion for assistance.

## 2024-02-09 NOTE — ED Notes (Signed)
 Attempted to call respiratory for [facility]- number not reachable.

## 2024-02-09 NOTE — ED Notes (Signed)
 Bell alarm placed on.

## 2024-02-09 NOTE — ED Notes (Signed)
 NURSE HECTOR INFORMED OF ASSIGNED BED

## 2024-02-09 NOTE — ED Triage Notes (Signed)
 Pt from home via EMS- family endorsed pt has been acting off, talking out or head, and seeing things x3 days, and they suspect a UTI. Pt AOX4 w/ ems, c/o nasal congestion.  Pt seen talking to the wall at this time, states I like your hair it's nice, air blowing through it. 139- cbg 150/74 90 hr 99.2 oral 96% RA

## 2024-02-09 NOTE — ED Provider Notes (Signed)
 [facility] Provider Note    Event Date/Time   First MD Initiated Contact with Patient 02/09/24 [phone]     (approximate)   History   Altered Mental Status  Pt from home via EMS- family endorsed pt has been acting off, talking out or head, and seeing things x3 days, and they suspect a UTI. Pt AOX4 w/ ems, c/o nasal congestion.  Pt seen talking to the wall at this time, states I like your hair it's nice, air blowing through it. 139- cbg 150/74 90 hr 99.2 oral 96% RA    HPI JOHNANNA Clements is a 89 y.o. female PMH COPD, CHF, prior UTIs, hyponatremia, A-fib not on anticoagulation presents for evaluation of altered mental status - Per EMS, patient was reportedly acting strangely and having hallucinations over the past few days, they expressed concern for underlying UTI.  With EMS, alert and oriented, complaining of nasal congestion.  Vital signs stable. - Patient does endorse some recent cough for me.  Denies any abdominal pain.  Says she did vomit once this morning.  Unclear if she is having any urinary symptoms.  Somewhat limited historian but is awake and alert and having a full conversation with me.  Per chart review, last admitted in July of this year for encephalopathy.  Found to have UTI and norovirus as well as hypercarbia during her hospitalization.     Physical Exam   Triage Vital Signs: ED Triage Vitals  Encounter Vitals Group     BP 02/09/24 0711 (!) 145/76     Girls Systolic BP Percentile --      Girls Diastolic BP Percentile --      Boys Systolic BP Percentile --      Boys Diastolic BP Percentile --      Pulse Rate 02/09/24 0711 86     Resp 02/09/24 0711 18     Temp 02/09/24 0711 98.1 F (36.7 C)     Temp Source 02/09/24 0711 Oral     SpO2 02/09/24 0711 94 %     Weight --      Height --      Head Circumference --      Peak Flow --      Pain Score 02/09/24 0712 0     Pain Loc --      Pain Education --      Exclude from  Growth Chart --     Most recent vital signs: Vitals:   02/09/24 1415 02/09/24 1501  BP: (!) 150/68 (!) 151/74  Pulse: (!) 49 (!) 58  Resp: 19 19  Temp:  97.9 F (36.6 C)  SpO2: 100% 100%     General: Awake, no distress.  CV:  Good peripheral perfusion. RRR, RP 2+ Resp:  Normal effort. CTAB Abd:  No distention. Nontender to deep palpation throughout Neuro:  AO x 3 (thought the year was 1935), face symmetric, mild dysmetria with bilateral upper extremities though otherwise nonfocal neurologic exam to the extent that patient is able to follow instructions.  Cranial nerves II-XII intact, normal strength bilateral upper and lower extremities.   ED Results / Procedures / Treatments   Labs (all labs ordered are listed, but only abnormal results are displayed) Labs Reviewed  COMPREHENSIVE METABOLIC PANEL WITH GFR - Abnormal; Notable for the following components:      Result Value   Potassium 2.9 (*)    Chloride 97 (*)    Glucose, Bld 132 (*)    Calcium   10.5 (*)    Total Bilirubin 1.4 (*)    All other components within normal limits  CBC - Abnormal; Notable for the following components:   WBC 13.4 (*)    All other components within normal limits  URINALYSIS, ROUTINE W REFLEX MICROSCOPIC - Abnormal; Notable for the following components:   Color, Urine YELLOW (*)    APPearance CLOUDY (*)    Ketones, ur 5 (*)    Protein, ur 30 (*)    Leukocytes,Ua LARGE (*)    Bacteria, UA MANY (*)    All other components within normal limits  BLOOD GAS, VENOUS - Abnormal; Notable for the following components:   pH, Ven 7.47 (*)    pCO2, Ven 28 (*)    pO2, Ven 56 (*)    All other components within normal limits  PHOSPHORUS - Abnormal; Notable for the following components:   Phosphorus 1.3 (*)    All other components within normal limits  RESP PANEL BY RT-PCR (RSV, FLU A&B, COVID)  RVPGX2  AMMONIA  MAGNESIUM      EKG  pending   RADIOLOGY Radiology interpreted by myself and  radiology reports reviewed.  No acute pathology identified.    PROCEDURES:  Critical Care performed: No  Procedures   MEDICATIONS ORDERED IN ED: Medications  enoxaparin  (LOVENOX ) injection 40 mg (has no administration in time range)  sodium chloride  flush (NS) 0.9 % injection 3 mL (3 mLs Intravenous Given 02/09/24 1059)  sodium chloride  flush (NS) 0.9 % injection 3 mL (3 mLs Intravenous Given 02/09/24 1058)  sodium chloride  flush (NS) 0.9 % injection 3 mL (3 mLs Intravenous Given 02/09/24 1058)  0.9 %  sodium chloride  infusion (has no administration in time range)  0.9 %  sodium chloride  infusion ( Intravenous Restarted 02/09/24 1519)  acetaminophen  (TYLENOL ) tablet 650 mg (has no administration in time range)    Or  acetaminophen  (TYLENOL ) suppository 650 mg (has no administration in time range)  ondansetron  (ZOFRAN ) tablet 4 mg (has no administration in time range)    Or  ondansetron  (ZOFRAN ) injection 4 mg (has no administration in time range)  bisacodyl (DULCOLAX) suppository 10 mg (has no administration in time range)  bisacodyl (DULCOLAX) EC tablet 10 mg (has no administration in time range)  polyethylene glycol (MIRALAX  / GLYCOLAX ) packet 17 g (17 g Oral Given 02/09/24 1134)  potassium chloride  (KLOR-CON ) packet 40 mEq (40 mEq Oral Given 02/09/24 1123)  cefTRIAXone  (ROCEPHIN ) 2 g in sodium chloride  0.9 % 100 mL IVPB (has no administration in time range)  metoprolol  tartrate (LOPRESSOR ) tablet 25 mg (25 mg Oral Given 02/09/24 1052)  busPIRone  (BUSPAR ) tablet 10 mg (10 mg Oral Given 02/09/24 1051)  feeding supplement (ENSURE PLUS HIGH PROTEIN) liquid 237 mL (237 mLs Oral Given 02/09/24 1058)  albuterol  (PROVENTIL ) (2.5 MG/3ML) 0.083% nebulizer solution 3 mL (has no administration in time range)  potassium PHOSPHATE  30 mmol in dextrose  5 % 500 mL infusion (30 mmol Intravenous New Bag/Given 02/09/24 1207)  potassium chloride  SA (KLOR-CON  M) CR tablet 40 mEq (40 mEq Oral Given 02/09/24  0844)  cefTRIAXone  (ROCEPHIN ) 2 g in sodium chloride  0.9 % 100 mL IVPB (0 g Intravenous Stopped 02/09/24 0956)  iohexol  (OMNIPAQUE ) 300 MG/ML solution 100 mL (100 mLs Intravenous Contrast Given 02/09/24 0905)  mineral oil enema 1 enema (1 enema Rectal Given 02/09/24 1114)     IMPRESSION / MDM / ASSESSMENT AND PLAN / ED COURSE  I reviewed the triage vital signs and the nursing notes.  DDX/MDM/AP: Differential diagnosis includes, but is not limited to, likely encephalopathy of underlying infectious or metabolic etiology, considered but doubt acute intracranial pathology including CVA, hemorrhage. Consider underlying viral syndrome such as influenza or COVID-19 contributing to presentation.  No apparent COPD exacerbation at this time though consider possibility of hypercarbia contributing.  Plan: - Labs - Chest x-ray - CT head   Patient's presentation is most consistent with acute presentation with potential threat to life or bodily function.  The patient is on the cardiac monitor to evaluate for evidence of arrhythmia and/or significant heart rate changes.  ED course below.  Workup with evidence of UTI as well as otitis media, suspect these are etiology of altered mental status.  Workup otherwise reassuring beyond mild leukocytosis and moderate hypokalemia, repleted.  Treating with ceftriaxone .  Admitted to hospitalist service.  Clinical Course as of 02/09/24 1542  Sat Feb 09, 2024  0754 VBG with very mild alkalosis, no significant hypercarbia--overall reassuring [MM]  0754 CMP with moderate hypokalemia, will replete p.o.  Otherwise unremarkable beyond very mild elevation in bilirubin.  No tenderness to palpation throughout abdomen. [MM]  0802 CXR: IMPRESSION: 1. Chronic lung disease with stable low lung volumes. No superimposed acute cardiopulmonary abnormality.   [MM]  0804 Ammonia wnl [MM]  0819 Collateral gathered from patient's sister by phone,  lives with her - did not eat yesterday, +vomiting yesterday and today - concern for urinary tract infection -- cloudy urine - confused, hallucinating, confused on dates - Confirms DNR, DNI  [MM]  [phone] CTH: IMPRESSION: 1. New complete left middle ear and mastoid opacification. Consider acute otitis media with mastoid effusion. No complicating features are evident. Contralateral middle ear and paranasal sinuses are well aerated. 2. No acute intracranial abnormality. Chronic small vessel disease and brain volume loss.   [MM]  [phone] Patient reevaluated, bilateral TMs not visible due to significant cerumen  CT findings consistent with left ear infection, will treat [MM]  0910 Urinalysis contaminated though given degree of pyuria concerning for infection  Already treating with ceftriaxone  for otitis media [MM]  0951 CTAP: IMPRESSION: 1. No acute findings in the abdomen or pelvis. 2. Large volume of desiccated stool in the rectum. Correlate for any clinical signs or symptoms of rectal impaction.   [MM]  [phone] Hospitalist consult order placed [MM]    Clinical Course User Index [MM] Clarine Ozell LABOR, MD     FINAL CLINICAL IMPRESSION(S) / ED DIAGNOSES   Final diagnoses:  Acute cystitis with hematuria  Infectious encephalopathy  Left otitis media, unspecified otitis media type  Hypokalemia     Rx / DC Orders   ED Discharge Orders     None        Note:  This document was prepared using Dragon voice recognition software and may include unintentional dictation errors.   Clarine Ozell LABOR, MD 02/09/24 [phone]

## 2024-02-09 NOTE — Progress Notes (Signed)
 Patient alert and oriented to self only on arrival to unit.  Patient slept remaining of this shift;responding to verbal cues, not following directions.  Dr Von notified of change in mental status.

## 2024-02-10 DIAGNOSIS — N3001 Acute cystitis with hematuria: Secondary | ICD-10-CM | POA: Diagnosis not present

## 2024-02-10 LAB — BASIC METABOLIC PANEL WITH GFR
Anion gap: 9 (ref 5–15)
BUN: 8 mg/dL (ref 8–23)
CO2: 29 mmol/L (ref 22–32)
Calcium: 9.7 mg/dL (ref 8.9–10.3)
Chloride: 104 mmol/L (ref 98–111)
Creatinine, Ser: 0.71 mg/dL (ref 0.44–1.00)
GFR, Estimated: >60 mL/min (ref >60)
Glucose, Bld: 92 mg/dL (ref 70–99)
Potassium: 3.2 mmol/L — ABNORMAL LOW (ref 3.5–5.1)
Sodium: 142 mmol/L (ref 135–145)

## 2024-02-10 LAB — PHOSPHORUS: Phosphorus: 3.2 mg/dL (ref 2.5–4.6)

## 2024-02-10 LAB — MAGNESIUM: Magnesium: 1.8 mg/dL (ref 1.7–2.4)

## 2024-02-10 LAB — CBC
HCT: 36.2 % (ref 36.0–46.0)
Hemoglobin: 11.8 g/dL — ABNORMAL LOW (ref 12.0–15.0)
MCH: 31.3 pg (ref 26.0–34.0)
MCHC: 32.6 g/dL (ref 30.0–36.0)
MCV: 96 fL (ref 80.0–100.0)
Platelets: 252 K/uL (ref 150–400)
RBC: 3.77 MIL/uL — ABNORMAL LOW (ref 3.87–5.11)
RDW: 13.2 % (ref 11.5–15.5)
WBC: 10.2 K/uL (ref 4.0–10.5)
nRBC: 0 % (ref 0.0–0.2)

## 2024-02-10 MED ORDER — AMLODIPINE BESYLATE 5 MG PO TABS
5.0000 mg | ORAL_TABLET | Freq: Every day | ORAL | Status: DC
  Administered 2024-02-10 – 2024-02-12 (×3): 5 mg via ORAL
  Filled 2024-02-10 (×3): qty 1

## 2024-02-10 MED ORDER — POTASSIUM CHLORIDE CRYS ER 20 MEQ PO TBCR
40.0000 meq | EXTENDED_RELEASE_TABLET | ORAL | Status: AC
  Administered 2024-02-10 (×2): 40 meq via ORAL
  Filled 2024-02-10 (×2): qty 2

## 2024-02-10 MED ORDER — POTASSIUM CHLORIDE 20 MEQ PO PACK
40.0000 meq | PACK | ORAL | Status: DC
  Administered 2024-02-10: 40 meq via ORAL
  Filled 2024-02-10: qty 2

## 2024-02-10 MED ORDER — METOPROLOL TARTRATE 25 MG PO TABS
25.0000 mg | ORAL_TABLET | Freq: Two times a day (BID) | ORAL | Status: DC
  Administered 2024-02-11 – 2024-02-12 (×3): 25 mg via ORAL
  Filled 2024-02-10 (×3): qty 1

## 2024-02-10 NOTE — Plan of Care (Signed)

## 2024-02-10 NOTE — Progress Notes (Signed)
 OT Cancellation Note  Patient Details Name: Virginia Clements MRN: [id number] DOB: 08/11/1929   Cancelled Treatment:    Reason Eval/Treat Not Completed: OT screened, no needs identified, will sign off. Consult received, chart reviewed. TOC confirmed pt has been bed bound and TOTAL CARE for all ADL/IADL x4 yrs. No acute skilled OT services indicated. Will sign off.  Tawonna Esquer R., MPH, MS, OTR/L ascom [phone] 02/10/24, 10:12 AM

## 2024-02-10 NOTE — TOC Initial Note (Signed)
 Transition of Care [facility]) - Initial/Assessment Note    Patient Details  Name: Virginia Clements MRN: [id number] Date of Birth: [birth year]  [facility]) CM/SW Contact:    Marinda Cooks, RN Phone Number: 02/10/2024, 10:44 AM  Clinical Narrative:                  This CM spoke with pt's sister / (HCPOA) [name] introduced role  and completed Initial assessement.Per bedside RN  Pt A&O x2-3 with intermittent confusion.  Pt lives  in a ranch style home with ramp entry Pt has family support provided by family  care giver (Dianna).  [facility] & PCP is Dr. Norleen Rower and has DME at home that includes hoyer lyt, w/c, oxygen with adapt   Pt has not been at SNF prior to  this admission or have [facility] services coordinated . TOC  will cont to follow pt dc planning / care coordination  during hospital stay and update as applicable.    Expected Discharge Plan and Services  To return home with family     Prior Living Arrangements/Services  Home with sister and brother in law                      Activities of Daily Living      Permission Sought/Granted                  Emotional Assessment              Admission diagnosis:  UTI (urinary tract infection) [N39.0] Acute cystitis with hematuria [N30.01] Infectious encephalopathy [G93.49, B99.9] Left otitis media, unspecified otitis media type [H66.92] Patient Active Problem List   Diagnosis Date Noted   UTI (urinary tract infection) 02/09/2024   Hypophosphatemia 11/04/2023   Hypotension 11/01/2023   Acute on chronic respiratory failure with hypoxia and hypercapnia (HCC) 11/01/2023   COPD with acute exacerbation (HCC) 10/31/2023   Infection due to Norovirus species 10/31/2023   Impaired fasting glucose 10/31/2023   Diarrhea 10/30/2023   PAF (paroxysmal atrial fibrillation) (HCC) 10/30/2023   Obesity (BMI 30-39.9) 10/30/2023   Acute on chronic diastolic CHF (congestive heart failure) (HCC)  10/30/2023   Acute CHF (congestive heart failure) (HCC) 01/11/2023   Acute on chronic diastolic congestive heart failure (HCC) 01/11/2023   Encephalopathy 01/11/2023   Shortness of breath 01/11/2023   Paroxysmal atrial fibrillation (HCC) 03/20/2022   Acute hypoxemic respiratory failure due to COVID-19 [facility]) 03/17/2022   Depression 03/17/2022   Acute metabolic encephalopathy 03/17/2022   Hyponatremia 08/27/2020   Hyperkalemia 08/27/2020   Fecal impaction (HCC) 08/27/2020   AKI (acute kidney injury) 06/18/2020   Vomiting 06/18/2020   Wheelchair dependent 06/18/2020   Benign essential HTN 06/18/2020   Wheelchair dependence 06/16/2020   Generalized weakness 06/16/2020   Sepsis secondary to UTI (HCC) 06/16/2020   Urinary tract infection due to ESBL Klebsiella 06/15/2020   Hypokalemia 05/31/2018   PCP:  Rower Norleen BIRCH, MD Pharmacy:   [phone] [address], [place] - [address] [place] [address] Phone: [phone] Fax: [phone]     Social Drivers of Health (SDOH) Social History: SDOH Screenings   Food Insecurity: Patient Unable To Answer (02/09/2024)  Housing: Unknown (02/09/2024)  Transportation Needs: Patient Unable To Answer (02/09/2024)  Utilities: Patient Unable To Answer (02/09/2024)  Financial Resource Strain: Low Risk  (05/31/2018)  Physical Activity: Unknown (05/31/2018)  Social Connections: Patient Unable To Answer (02/09/2024)  Stress: No Stress Concern Present (05/31/2018)  Tobacco Use: Medium Risk (02/09/2024)   SDOH Interventions:     Readmission Risk Interventions     No data to display

## 2024-02-10 NOTE — Progress Notes (Signed)
 [facility] Progress Note  Patient: Virginia Clements    FMW:[id number]  DOA: 02/09/2024     Date of Service: the patient was seen and examined on 02/10/2024  Chief Complaint  Patient presents with   Altered Mental Status   Brief hospital course: Virginia Clements is a 89 y.o. female with PMH of COPD, CHF, paroxysmal A-fib not on [facility], currently sinus rhythm, as reviewed from EMR, presented at [facility] ED from home with complaining of altered mental status and hallucination for past few days.  Patient's sister noticed that patient is talking out of her mind, seeing people which are not there, also had an episode of vomiting yesterday and today.  Patient herself denied any complaints, on my exam patient was sleepy.  Most of the HPI got from the ED doc and EMR chart review.     ED Course: VS temp 98.1, HR 86, RR 18, BP 145/76, 94% on RA BMP: Potassium 2.9 hypokalemia, chloride 97, glucose 132, calcium  10.5 Hypophosphatemia, Phos 1.3, T. bili 1.4 CBC: Mild leukocytosis WBC 13.4 elevated UA positive, LE large, nitrate negative, bacteria many, WBC >50   Assessment and Plan:  # Metabolic encephalopathy Patient presented with altered mental status and visual hallucination Secondary to UTI and acute otitis media Continue supportive care Treat primary causes below Continue IV fluid for hydration   # UTI (urinary tract infection) UA positive Continue ceftriaxone  2 g IV daily Follow urine culture     # Acute otitis media Continue ceftriaxone  as above     # Hypokalemia: Potassium repleted. # Hypophosphatemia: Phos repleted. Monitor electrolytes daily and replete as needed.   # Constipation with fecal impaction Mineral oil enema x 1 dose given Continue oral laxatives   # Hypertension and p.Afib, now SR Continued Lopressor  25 mg p.o. twice daily Monitor BP and titrate medications according 11/9 low HR, Hold BB and started Amlodipine     # Depression, continued  BuSpar    Body mass index is 27 kg/m.  Interventions:  Diet: Reg diet DVT Prophylaxis: Subcutaneous Lovenox    Advance goals of care discussion: DNR-limited  Family Communication: family was not present at bedside, at the time of interview.  The pt provided permission to discuss medical plan with the family. Opportunity was given to ask question and all questions were answered satisfactorily.  11/9 d/w patient's sister and caretaker over the phone  Disposition:  Pt is from home, admitted with AMS, UTI, electrolyte imbalance, fecal impaction and left otitis media, still has AMS, electrolyte imbalance and cultures pending, which precludes a safe discharge. Discharge to home, when stable, may need few days to improve.  Subjective: No significant events overnight, patient is AO x 3, hard of hearing.  Denies any complaints, resting comfortably.  Physical Exam: General: NAD, lying comfortably Appear in no distress, affect appropriate Eyes: PERRLA ENT: Oral Mucosa Clear, moist  Neck: no JVD,  Cardiovascular: S1 and S2 Present, no Murmur,  Respiratory: good respiratory effort, Bilateral Air entry equal and Decreased, no Crackles, no wheezes Abdomen: Bowel Sound present, Soft and no tenderness,  Skin: no rashes Extremities: no Pedal edema, no calf tenderness Neurologic: without any new focal findings Gait not checked due to patient safety concerns  Vitals:   02/09/24 1641 02/09/24 2028 02/10/24 0016 02/10/24 0405  BP: (!) 157/69 110/88 (!) 140/77 (!) 142/103  Pulse: (!) 55 (!) 49 (!) 51 (!) 59  Resp: 19 16 16 20   Temp: 98 F (36.7 C)  (!) 97.5 F (36.4 C)  97.6 F (36.4 C)  TempSrc: Oral  Oral Oral  SpO2: 100% 99% 100% 100%  Weight:      Height:        Intake/Output Summary (Last 24 hours) at 02/10/2024 0809 Last data filed at 02/10/2024 0600 Gross per 24 hour  Intake 1332.73 ml  Output 290 ml  Net 1042.73 ml   Filed Weights   02/09/24 0748  Weight: 73.6 kg    Data  Reviewed: I have personally reviewed and interpreted daily labs, tele strips, imagings as discussed above. I reviewed all nursing notes, pharmacy notes, vitals, pertinent old records I have discussed plan of care as described above with RN and patient/family.  CBC: Recent Labs  Lab 02/09/24 0722 02/10/24 0553  WBC 13.4* 10.2  HGB 13.2 11.8*  HCT 38.6 36.2  MCV 92.8 96.0  PLT 285 252   Basic Metabolic Panel: Recent Labs  Lab 02/09/24 0722 02/10/24 0553  NA 138 142  K 2.9* 3.2*  CL 97* 104  CO2 26 29  GLUCOSE 132* 92  BUN 9 8  CREATININE 0.67 0.71  CALCIUM  10.5* 9.7  MG 1.8 1.8  PHOS 1.3* 3.2    Studies: CT ABDOMEN PELVIS W CONTRAST Result Date: 02/09/2024 EXAM: CT ABDOMEN AND PELVIS WITH CONTRAST 02/09/2024 09:04:40 AM TECHNIQUE: CT of the abdomen and pelvis was performed with the administration of 100 mL of iohexol  (OMNIPAQUE ) 300 MG/ML solution. Multiplanar reformatted images are provided for review. Automated exposure control, iterative reconstruction, and/or weight-based adjustment of the mA/kV was utilized to reduce the radiation dose to as low as reasonably achievable. COMPARISON: 08/27/2020 CLINICAL HISTORY: Recurrent vomiting, altered mental status, leukocytosis, mild bilirubin elevation--eval for underlying infection. FINDINGS: LOWER CHEST: Subpleural honeycombing and interstitial reticulation identified within the lung bases suggestive of chronic interstitial lung disease. LIVER: The liver is unremarkable. GALLBLADDER AND BILE DUCTS: Mild distention of the gallbladder. No wall thickening, pericholecystic inflammation or stones noted. Common bile duct measures up to 1 cm, image 44/5. No signs of choledocholithiasis. SPLEEN: The spleen is within normal limits in size and appearance. PANCREAS: Pancreas is normal in size and contour without focal lesion or ductal dilatation. ADRENAL GLANDS: Normal left adrenal gland. Previously noted right adrenal gland adenoma is unchanged  measuring 2.1 cm, image 18/2. No follow-up imaging recommended. KIDNEYS, URETERS AND BLADDER: No stones in the kidneys or ureters. No hydronephrosis. No perinephric or periureteral stranding. Urinary bladder is unremarkable. GI AND BOWEL: Stomach demonstrates no acute abnormality. The appendix is not visualized. No secondary signs of acute appendicitis noted. Mild colonic stool burden. Sigmoid diverticulosis without signs of acute diverticulitis. Signs of previous sigmoid resection with anastomotic suture chain in the left lower quadrant of the abdomen. Large volume of desiccated stool is identified within the rectum. Atherosclerotic calcifications. PERITONEUM AND RETROPERITONEUM: No ascites. No free air. VASCULATURE: Aorta is normal in caliber. LYMPH NODES: No lymphadenopathy. REPRODUCTIVE ORGANS: Status post hysterectomy. No adnexal mass. BONES AND SOFT TISSUES: Ventral abdominal wall hernia repair. Previous open reduction and internal fixation of the right femur. Remote healed left inferior pubic rami fracture. Bones are diffusely osteopenic. Chronic compression fractures are again seen involving the T12, L1, and L5 vertebrae. No focal soft tissue abnormality. IMPRESSION: 1. No acute findings in the abdomen or pelvis. 2. Large volume of desiccated stool in the rectum. Correlate for any clinical signs or symptoms of rectal impaction. Electronically signed by: Waddell Calk MD 02/09/2024 09:42 AM EST RP Workstation: [id number]    Scheduled Meds:  amLODipine   5 mg Oral Daily   bisacodyl  10 mg Oral QHS   busPIRone   10 mg Oral BID   enoxaparin  (LOVENOX ) injection  40 mg Subcutaneous Q24H   feeding supplement  237 mL Oral BID BM   [START ON 02/11/2024] metoprolol  tartrate  25 mg Oral BID   polyethylene glycol  17 g Oral BID   potassium chloride   40 mEq Oral Q4H   sodium chloride  flush  3 mL Intravenous Q12H   sodium chloride  flush  3 mL Intravenous Q12H   Continuous Infusions:  sodium chloride       sodium chloride  75 mL/hr at 02/10/24 0500   cefTRIAXone  (ROCEPHIN )  IV     PRN Meds: sodium chloride , acetaminophen  **OR** acetaminophen , albuterol , bisacodyl, ondansetron  **OR** ondansetron  (ZOFRAN ) IV, sodium chloride  flush  Time spent: 55 minutes  Author: ELVAN SOR. MD [facility] 02/10/2024 8:09 AM  To reach On-call, see care teams to locate the attending and reach out to them via [email or web address]. If 7PM-7AM, please contact night-coverage If you still have difficulty reaching the attending provider, please page the [facility] (Director on Call) for [facility] on amion for assistance.

## 2024-02-10 NOTE — TOC CM/SW Note (Signed)
 The pt has a medical condition which requires positioning of body in ways not feasible with an ordinary bed   The pt has a medical condition which requires positioning of body in ways not feasible with an ordinary bed in order to alleviate pain .

## 2024-02-10 NOTE — Progress Notes (Signed)
 PT Cancellation Note  Patient Details Name: Virginia Clements MRN: [id number] DOB: 05/05/1929   Cancelled Treatment:    Reason Eval/Treat Not Completed: PT screened, no needs identified, will sign off. Patient has been bed bound x 4 years per family. No acute skilled needs. Signing off.    Virginia Clements 02/10/2024, 10:16 AM

## 2024-02-11 ENCOUNTER — Inpatient Hospital Stay

## 2024-02-11 DIAGNOSIS — N3001 Acute cystitis with hematuria: Secondary | ICD-10-CM | POA: Diagnosis not present

## 2024-02-11 LAB — CBC
HCT: 36.8 % (ref 36.0–46.0)
Hemoglobin: 12.2 g/dL (ref 12.0–15.0)
MCH: 31.9 pg (ref 26.0–34.0)
MCHC: 33.2 g/dL (ref 30.0–36.0)
MCV: 96.3 fL (ref 80.0–100.0)
Platelets: 258 K/uL (ref 150–400)
RBC: 3.82 MIL/uL — ABNORMAL LOW (ref 3.87–5.11)
RDW: 13.3 % (ref 11.5–15.5)
WBC: 9.8 K/uL (ref 4.0–10.5)
nRBC: 0 % (ref 0.0–0.2)

## 2024-02-11 LAB — MAGNESIUM: Magnesium: 2 mg/dL (ref 1.7–2.4)

## 2024-02-11 LAB — BASIC METABOLIC PANEL WITH GFR
Anion gap: 9 (ref 5–15)
BUN: 12 mg/dL (ref 8–23)
CO2: 27 mmol/L (ref 22–32)
Calcium: 9.9 mg/dL (ref 8.9–10.3)
Chloride: 103 mmol/L (ref 98–111)
Creatinine, Ser: 0.57 mg/dL (ref 0.44–1.00)
GFR, Estimated: >60 mL/min (ref >60)
Glucose, Bld: 92 mg/dL (ref 70–99)
Potassium: 4.4 mmol/L (ref 3.5–5.1)
Sodium: 139 mmol/L (ref 135–145)

## 2024-02-11 LAB — PHOSPHORUS: Phosphorus: 2.3 mg/dL — ABNORMAL LOW (ref 2.5–4.6)

## 2024-02-11 MED ORDER — POLYETHYLENE GLYCOL 3350 17 GM/SCOOP PO POWD
119.0000 g | Freq: Once | ORAL | Status: AC
  Administered 2024-02-11: 119 g via ORAL
  Filled 2024-02-11: qty 119

## 2024-02-11 MED ORDER — POTASSIUM & SODIUM PHOSPHATES 280-160-250 MG PO PACK
1.0000 | PACK | Freq: Three times a day (TID) | ORAL | Status: AC
  Administered 2024-02-11 (×2): 1 via ORAL
  Filled 2024-02-11 (×2): qty 1

## 2024-02-11 MED ORDER — POLYETHYLENE GLYCOL 3350 17 G PO PACK: 17.0000 g | PACK | Freq: Two times a day (BID) | ORAL | Status: DC

## 2024-02-11 MED ORDER — CARBAMIDE PEROXIDE 6.5 % OT SOLN
5.0000 [drp] | Freq: Two times a day (BID) | OTIC | Status: DC
  Administered 2024-02-11 – 2024-02-12 (×3): 5 [drp] via OTIC
  Filled 2024-02-11: qty 15

## 2024-02-11 MED ORDER — SMOG ENEMA
960.0000 mL | Freq: Once | RECTAL | Status: DC
  Filled 2024-02-11: qty 960

## 2024-02-11 NOTE — Plan of Care (Signed)

## 2024-02-11 NOTE — TOC Progression Note (Signed)
 Transition of Care [facility]) - Progression Note    Patient Details  Name: Virginia Clements MRN: [id number] Date of Birth: 10/13/1929  [facility] CM/SW Contact  Dalia GORMAN Fuse, RN Phone Number: 02/11/2024, 10:45 AM  Clinical Narrative:    Continues on IVF and IV abx for hydration and UTI. Plan to discharge to home with family and caregiver support when medically appropriate. TOC will continue to follow.                     Expected Discharge Plan and Services                                               Social Drivers of Health (SDOH) Interventions SDOH Screenings   Food Insecurity: Patient Unable To Answer (02/09/2024)  Housing: Unknown (02/09/2024)  Transportation Needs: Patient Unable To Answer (02/09/2024)  Utilities: Patient Unable To Answer (02/09/2024)  Financial Resource Strain: Low Risk  (05/31/2018)  Physical Activity: Unknown (05/31/2018)  Social Connections: Patient Unable To Answer (02/09/2024)  Stress: No Stress Concern Present (05/31/2018)  Tobacco Use: Medium Risk (02/09/2024)    Readmission Risk Interventions     No data to display

## 2024-02-11 NOTE — Plan of Care (Signed)
 Patient has remained free from any noted signs of acute distress.  Noted to be more alert, aware of surroundings, but continued to be redirected for periods of confusion or forgetfulness.  Purewick remains intact.  No additional medical interventions required at this time.

## 2024-02-11 NOTE — Final Consult Note (Signed)
 Virginia Clements, Virginia Clements [id number] [birth year] Von Bell[place], MD  Reason for Consult: cerumen impaction, ? Acute otitis media on imaging  HPI: Patient is a 89 year old female admitted for UTI and altered mental status changes.  Found to have fluid in left ear on imaging which was new since previous imaging.  No worrisome findings but could be infectious etiology.  Asked for evaluate patient for cerumen removal to aid in ear examination.  Patient denies any issues other than hard of hearing.  Denies ear drainage.  No ear pain. She denies having to have her ear cleaned out before.  Allergies:  Allergies  Allergen Reactions   Alendronate Other (See Comments)    Nervousness and shakiness per pt   Codeine     dizziness    ROS: Review of systems normal other than 12 systems except per HPI.  PMH:  Past Medical History:  Diagnosis Date   Breast cancer (HCC)    Hypertension    Hypoglycemia    PAF (paroxysmal atrial fibrillation) (HCC)    Radiation 2009    FH:  Family History  Problem Relation Age of Onset   Hypertension Mother     SH:  Social History   Socioeconomic History   Marital status: Widowed    Spouse name: Not on file   Number of children: 0   Years of education: Not on file   Highest education level: 8th grade  Occupational History   Occupation: retired   Tobacco Use   Smoking status: Former   Smokeless tobacco: Never  Advertising Account Planner   Vaping status: Never Used  Substance and Sexual Activity   Alcohol use: Not Currently   Drug use: Never   Sexual activity: Not Currently  Other Topics Concern   Not on file  Social History Narrative   Not on file   Social Drivers of Health   Financial Resource Strain: Low Risk  (05/31/2018)   Overall Financial Resource Strain (CARDIA)    Difficulty of Paying Living Expenses: Not hard at all  Food Insecurity: Patient Unable To Answer (02/09/2024)   Hunger Vital Sign    Worried About Running Out of Food in the Last Year:  Patient unable to answer    Ran Out of Food in the Last Year: Patient unable to answer  Transportation Needs: Patient Unable To Answer (02/09/2024)   PRAPARE - Transportation    Lack of Transportation (Medical): Patient unable to answer    Lack of Transportation (Non-Medical): Patient unable to answer  Physical Activity: Unknown (05/31/2018)   Exercise Vital Sign    Days of Exercise per Week: Patient declined    Minutes of Exercise per Session: Patient declined  Stress: No Stress Concern Present (05/31/2018)   [employer] of Occupational Health - Occupational Stress Questionnaire    Feeling of Stress : Not at all  Social Connections: Patient Unable To Answer (02/09/2024)   Social Connection and Isolation Panel    Frequency of Communication with Friends and Family: Patient unable to answer    Frequency of Social Gatherings with Friends and Family: Patient unable to answer    Attends Religious Services: Patient unable to answer    Active Member of Clubs or Organizations: Patient unable to answer    Attends Banker Meetings: Patient unable to answer    Marital Status: Patient unable to answer  Intimate Partner Violence: Patient Unable To Answer (02/09/2024)   Humiliation, Afraid, Rape, and Kick questionnaire    Fear of Current or Ex-Partner:  Patient unable to answer    Emotionally Abused: Patient unable to answer    Physically Abused: Patient unable to answer    Sexually Abused: Patient unable to answer    PSH:  Past Surgical History:  Procedure Laterality Date   BREAST BIOPSY Right 2009    Physical  Exam:  GEN-  supine in bed in NAD NEURO- CN 2-12 grossly intact and symmetric. EARS-  left eac occluded with cerumen, right eac occluded with cerumen NOSE- clear anteriorly OC/OP-  no worrisome masses or lesions NECK- supple with no LD RESP- unlabored  Procedure:  Cerumen removal with magnification:  Pre-procedure diagnosis:  cerumen impaction.  Post-procedure  diagnosis:  same.  Description of procedure:  After verbal consent was obtained the patient's left ear was examined at bedside under 2.5x loupe magnification.  Using a cerumen curette, the patient's left EAC was cleaned of cerumen and dry skin.  This demonstrated an intact tympanic membrane with clear fluid in middle ear space.  No erythema or edema of tympanic membrane.  No infection noted.  This was attempted to be repeated on the patient's right side.  Some was was able to be removed but patient had a hard time tolerating and the patient's right cerumen was no fully removed.   A/P: Cerumen impaction bilateral, Left eustachian tube dysfunction with serous otitis  Plan:  Non-infectious serous fluid on left side of unknown duration.  Can be treated conservatively with time and Flonase.  Recommend debrox to soften remaining wax on right side and follow up as outpatient for right ear cleaning.     Virginia Clements 02/11/2024 12:04 PM

## 2024-02-11 NOTE — Progress Notes (Signed)
 [facility] Progress Note  Patient: Virginia Clements    FMW:[id number]  DOA: 02/09/2024     Date of Service: the patient was seen and examined on 02/11/2024  Chief Complaint  Patient presents with   Altered Mental Status   Brief hospital course: Virginia Clements is a 89 y.o. female with PMH of COPD, CHF, paroxysmal A-fib not on [facility], currently sinus rhythm, as reviewed from EMR, presented at [facility] ED from home with complaining of altered mental status and hallucination for past few days.  Patient's sister noticed that patient is talking out of her mind, seeing people which are not there, also had an episode of vomiting yesterday and today.  Patient herself denied any complaints, on my exam patient was sleepy.  Most of the HPI got from the ED doc and EMR chart review.     ED Course: VS temp 98.1, HR 86, RR 18, BP 145/76, 94% on RA BMP: Potassium 2.9 hypokalemia, chloride 97, glucose 132, calcium  10.5 Hypophosphatemia, Phos 1.3, T. bili 1.4 CBC: Mild leukocytosis WBC 13.4 elevated UA positive, LE large, nitrate negative, bacteria many, WBC >50   Assessment and Plan:  # Metabolic encephalopathy: resolved Patient presented with altered mental status and visual hallucination Secondary to UTI and acute otitis media Continue supportive care Treat primary causes below Continue IV fluid for hydration   # UTI (urinary tract infection) UA positive Continue ceftriaxone  2 g IV daily Follow urine culture growing GNR, ID and sensitivity report is pending     # Acute left otitis media # Bilateral cerumen impaction Continue ceftriaxone  as above ENT consult appreciated, left ear was cleaned but patient could not tolerate the procedure in the right ear.  ENT recommended to use Debrox in the right ear.  And follow-up as an outpatient Continue Debrox twice daily for 3 days in the right ear.     # Hypokalemia: Potassium repleted. # Hypophosphatemia: Phos repleted. Monitor  electrolytes daily and replete as needed.   # Constipation with fecal impaction Mineral oil enema x 1 dose given Continue oral laxatives 11/10 x-ray KUB shows significant constipation, no bowel obstruction Smog enema x 1 dose order placed MiraLAX  bowel prep 119 g one-time dose order RN was advised to continue bowel prep.   # Hypertension and p.Afib, now SR Continued Lopressor  25 mg p.o. twice daily Monitor BP and titrate medications according 11/9 low HR, Hold BB and started Amlodipine     # Depression, continued BuSpar    Body mass index is 27 kg/m.  Interventions:  Diet: Reg diet DVT Prophylaxis: Subcutaneous Lovenox    Advance goals of care discussion: DNR-limited  Family Communication: family was not present at bedside, at the time of interview.  The pt provided permission to discuss medical plan with the family. Opportunity was given to ask question and all questions were answered satisfactorily.  11/9 d/w patient's sister and caretaker over the phone 11/10 d/w her nephew at bedside  Disposition:  Pt is from home, admitted with AMS, UTI, electrolyte imbalance, fecal impaction and left otitis media, still on IV antibiotics, awaiting for final urine culture report and needs bowel prep for severe constipation, which precludes a safe discharge. Discharge to home, when stable, most likely tomorrow a.m.   Subjective: No significant events overnight, patient is AO x 3, hard of hearing.  Denies any complaints, resting comfortably.  Physical Exam: General: NAD, lying comfortably Appear in no distress, affect appropriate Eyes: PERRLA ENT: Oral Mucosa Clear, moist  Neck: no JVD,  Cardiovascular: S1 and S2 Present, no Murmur,  Respiratory: good respiratory effort, Bilateral Air entry equal and Decreased, no Crackles, no wheezes Abdomen: Bowel Sound present, Soft and no tenderness,  Skin: no rashes Extremities: no Pedal edema, no calf tenderness Neurologic: without any new  focal findings Gait not checked due to patient safety concerns  Vitals:   02/10/24 2336 02/11/24 0441 02/11/24 0915 02/11/24 1208  BP: 133/71 (!) 145/66 135/63 (!) 145/61  Pulse: 64 78 71 (!) 56  Resp: 18 16 16 17   Temp: 97.9 F (36.6 C) 97.6 F (36.4 C) 98.2 F (36.8 C) 98.7 F (37.1 C)  TempSrc:   Oral Oral  SpO2: 99% 99% 98% 98%  Weight:      Height:        Intake/Output Summary (Last 24 hours) at 02/11/2024 1504 Last data filed at 02/11/2024 1400 Gross per 24 hour  Intake 340 ml  Output 1200 ml  Net -860 ml   Filed Weights   02/09/24 0748  Weight: 73.6 kg    Data Reviewed: I have personally reviewed and interpreted daily labs, tele strips, imagings as discussed above. I reviewed all nursing notes, pharmacy notes, vitals, pertinent old records I have discussed plan of care as described above with RN and patient/family.  CBC: Recent Labs  Lab 02/09/24 0722 02/10/24 0553 02/11/24 0535  WBC 13.4* 10.2 9.8  HGB 13.2 11.8* 12.2  HCT 38.6 36.2 36.8  MCV 92.8 96.0 96.3  PLT 285 252 258   Basic Metabolic Panel: Recent Labs  Lab 02/09/24 0722 02/10/24 0553 02/11/24 0535  NA 138 142 139  K 2.9* 3.2* 4.4  CL 97* 104 103  CO2 26 29 27   GLUCOSE 132* 92 92  BUN 9 8 12   CREATININE 0.67 0.71 0.57  CALCIUM  10.5* 9.7 9.9  MG 1.8 1.8 2.0  PHOS 1.3* 3.2 2.3*    Studies: DG Abd 1 View Result Date: 02/11/2024 CLINICAL DATA:  Fecal impaction. EXAM: ABDOMEN - 1 VIEW COMPARISON:  CT abdomen pelvis dated 02/09/2024. FINDINGS: Large amount of stool throughout the colon. No bowel dilatation or evidence of obstruction. No free air or radiopaque calculi. Osteopenia with degenerative changes of the spine. Partially visualized right femoral neck fixation hardware. No acute osseous pathology. IMPRESSION: Constipation. No bowel obstruction. Electronically Signed   By: Vanetta Chou M.D.   On: 02/11/2024 11:18    Scheduled Meds:  amLODipine   5 mg Oral Daily   bisacodyl  10  mg Oral QHS   busPIRone   10 mg Oral BID   enoxaparin  (LOVENOX ) injection  40 mg Subcutaneous Q24H   feeding supplement  237 mL Oral BID BM   metoprolol  tartrate  25 mg Oral BID   polyethylene glycol  17 g Oral BID   potassium & sodium phosphates  1 packet Oral TID AC & HS   sodium chloride  flush  3 mL Intravenous Q12H   sodium chloride  flush  3 mL Intravenous Q12H   Continuous Infusions:  cefTRIAXone  (ROCEPHIN )  IV 2 g (02/11/24 1020)   PRN Meds: acetaminophen  **OR** acetaminophen , albuterol , bisacodyl, ondansetron  **OR** ondansetron  (ZOFRAN ) IV, sodium chloride  flush  Time spent: 55 minutes  Author: ELVAN SOR. MD [facility] 02/11/2024 3:04 PM  To reach On-call, see care teams to locate the attending and reach out to them via [email or web address]. If 7PM-7AM, please contact night-coverage If you still have difficulty reaching the attending provider, please page the [facility] (Director on Call) for [facility] on amion for assistance.

## 2024-02-12 ENCOUNTER — Other Ambulatory Visit: Payer: Self-pay

## 2024-02-12 ENCOUNTER — Inpatient Hospital Stay

## 2024-02-12 DIAGNOSIS — N3001 Acute cystitis with hematuria: Secondary | ICD-10-CM | POA: Diagnosis not present

## 2024-02-12 LAB — PHOSPHORUS: Phosphorus: 2.9 mg/dL (ref 2.5–4.6)

## 2024-02-12 LAB — CBC
HCT: 38.3 % (ref 36.0–46.0)
Hemoglobin: 12.8 g/dL (ref 12.0–15.0)
MCH: 31.4 pg (ref 26.0–34.0)
MCHC: 33.4 g/dL (ref 30.0–36.0)
MCV: 93.9 fL (ref 80.0–100.0)
Platelets: 320 K/uL (ref 150–400)
RBC: 4.08 MIL/uL (ref 3.87–5.11)
RDW: 12.8 % (ref 11.5–15.5)
WBC: 11.6 K/uL — ABNORMAL HIGH (ref 4.0–10.5)
nRBC: 0 % (ref 0.0–0.2)

## 2024-02-12 LAB — URINE CULTURE: Culture: >=100000 — AB

## 2024-02-12 LAB — BASIC METABOLIC PANEL WITH GFR
Anion gap: 14 (ref 5–15)
BUN: 12 mg/dL (ref 8–23)
CO2: 24 mmol/L (ref 22–32)
Calcium: 10.6 mg/dL — ABNORMAL HIGH (ref 8.9–10.3)
Chloride: 98 mmol/L (ref 98–111)
Creatinine, Ser: 0.58 mg/dL (ref 0.44–1.00)
GFR, Estimated: >60 mL/min (ref >60)
Glucose, Bld: 128 mg/dL — ABNORMAL HIGH (ref 70–99)
Potassium: 4.3 mmol/L (ref 3.5–5.1)
Sodium: 136 mmol/L (ref 135–145)

## 2024-02-12 LAB — MAGNESIUM: Magnesium: 2.1 mg/dL (ref 1.7–2.4)

## 2024-02-12 MED ORDER — SULFAMETHOXAZOLE-TRIMETHOPRIM 800-160 MG PO TABS
1.0000 | ORAL_TABLET | Freq: Two times a day (BID) | ORAL | Status: DC
  Administered 2024-02-12: 1 via ORAL
  Filled 2024-02-12: qty 1

## 2024-02-12 MED ORDER — POLYETHYLENE GLYCOL 3350 17 GM/SCOOP PO POWD
17.0000 g | Freq: Two times a day (BID) | ORAL | 11 refills | Status: AC
  Filled 2024-02-12: qty 510, 15d supply, fill #0

## 2024-02-12 MED ORDER — SULFAMETHOXAZOLE-TRIMETHOPRIM 800-160 MG PO TABS
1.0000 | ORAL_TABLET | Freq: Two times a day (BID) | ORAL | 0 refills | Status: AC
  Filled 2024-02-12: qty 10, 5d supply, fill #0

## 2024-02-12 MED ORDER — BISACODYL 5 MG PO TBEC
10.0000 mg | DELAYED_RELEASE_TABLET | Freq: Every day | ORAL | 11 refills | Status: AC
  Filled 2024-02-12: qty 60, 30d supply, fill #0

## 2024-02-12 MED ORDER — BISACODYL 10 MG RE SUPP
10.0000 mg | Freq: Every day | RECTAL | 0 refills | Status: AC | PRN
  Filled 2024-02-12: qty 12, 12d supply, fill #0

## 2024-02-12 MED ORDER — CARBAMIDE PEROXIDE 6.5 % OT SOLN
5.0000 [drp] | Freq: Two times a day (BID) | OTIC | 0 refills | Status: AC
  Filled 2024-02-12: qty 15, 30d supply, fill #0

## 2024-02-12 NOTE — Progress Notes (Signed)
 Mobility Specialist - Progress Note   02/12/24 1346  Mobility  Activity Turned to right side;Turned to left side;Turned to back - supine  Level of Assistance Maximum assist, patient does 25-49%  Assistive Device None  Activity Response Tolerated well  Mobility visit 1 Mobility  Mobility Specialist Start Time (ACUTE ONLY) 1325  Mobility Specialist Stop Time (ACUTE ONLY) 1343  Mobility Specialist Time Calculation (min) (ACUTE ONLY) 18 min   Pt found laying [place] in bed upon entry, soiled and unclothed. Pt rolled L and R MaxA +2 while MS completed linen and gown change. Pt left supine with alarm set and needs within reach.  America Silvan Mobility Specialist 02/12/24 1:51 PM

## 2024-02-12 NOTE — Plan of Care (Signed)

## 2024-02-12 NOTE — Care Management Important Message (Signed)
 Important Message  Patient Details  Name: Virginia Clements MRN: [id number] Date of Birth: Dec 15, 1929   Important Message Given:  Yes - Medicare IM     Fatiha Guzy W, CMA 02/12/2024, 11:39 AM

## 2024-02-12 NOTE — Discharge Summary (Signed)
 [facility] Discharge Summary   Patient: Virginia Clements FMW:[id number]  PCP: Rudolpho Norleen BIRCH, MD  Date of admission: 02/09/2024   Date of discharge:  02/12/2024     Discharge Diagnoses:  Principal Problem:   UTI (urinary tract infection)   Admitted From: Home Disposition:  Home with [facility]  Recommendations for Outpatient Follow-up:  F/u with PCP in 1 wk Monitor BP and Titrate medications accordingly Continue to use laxatives to prevent fecal impaction and avoid imodium  use. Use debrox ear drop for 3-5 days every month to prevent cerumen plug  Follow up LABS/TEST:  Repeat CBC and BMP in 1-2 week   Follow-up Information     Rudolpho Norleen BIRCH, MD Follow up in 1 week(s).   Specialty: Internal Medicine Contact information: [address] [facility] [place] [address] [phone]                Diet recommendation: Cardiac diet  Activity: The patient is advised to gradually reintroduce usual activities, as tolerated  Discharge Condition: stable  Code Status: DNR-limited  History of present illness: As per the H and P dictated on admission.  Hospital Course:  Virginia Clements is a 89 y.o. female with PMH of COPD, CHF, paroxysmal A-fib not on [facility], currently sinus rhythm, as reviewed from EMR, presented at [facility] ED from home with complaining of altered mental status and hallucination for past few days.  Patient's sister noticed that patient is talking out of her mind, seeing people which are not there, also had an episode of vomiting yesterday and today.  Patient herself denied any complaints, on my exam patient was sleepy.  Most of the HPI got from the ED doc and EMR chart review.     ED Course: VS temp 98.1, HR 86, RR 18, BP 145/76, 94% on RA BMP: Potassium 2.9 hypokalemia, chloride 97, glucose 132, calcium  10.5 Hypophosphatemia, Phos 1.3, T. bili 1.4 CBC: Mild leukocytosis WBC 13.4 elevated UA positive, LE large, nitrate negative,  bacteria many, WBC >50   Assessment and Plan:  # Metabolic encephalopathy: resolved Patient presented with altered mental status and visual hallucination Secondary to UTI and acute otitis media. Continue supportive care, Treated primary causes below. S/p IV fluid for hydration.  Currently patient is back to her baseline   # UTI (urinary tract infection) UA positive. S/p ceftriaxone  2 g IV daily.  Urine culture growing Klebsiella pneumonia, ESBL.  Discontinue ceftriaxone  and transition to Bactrim  twice daily for 5 days  # Acute left otitis media # Bilateral cerumen impaction S/p ceftriaxone  during hospital stay, transition to Bactrim  as above.  ENT consult appreciated, left ear was cleaned but patient could not tolerate the procedure in the right ear.  ENT recommended to use Debrox in the right ear.  And follow-up as an outpatient Continue Debrox twice daily for 3 days in the right ear.   # Hypokalemia: Potassium repleted.  Resolved # Hypophosphatemia: Phos repleted.  Resolved  # Constipation with fecal impaction Mineral oil enema x 1 dose given Continue oral laxatives 11/10 x-ray KUB shows significant constipation, no bowel obstruction. Smog enema x 1 dose order placed but it was not given, unknown reason. S/p MiraLAX  bowel prep 119 g x one given and patient had several bowel movements 11/11 x-ray KUB: Diffuse gaseous distention of the colon is similar to minimally progressive in the interval. Prominent rectal stool again noted.  Since patient had several bowel movements, she does not want to try enema.  So  patient was discharged on MiraLAX  twice daily and Dulcolax nightly.  Discontinued Imodium .  Discussed with patient's caretaker over the phone.  # Hypertension and p.Afib, now SR Continued Lopressor  25 mg p.o. twice daily and amlodipine  10 mg p.o. daily, enalapril  10 mg p.o. twice daily.  monitor BP and heart rate and titrate medications accordingly.  # Depression, continued  BuSpar   Body mass index is 27 kg/m.  Nutrition Interventions:  Patient was NOT seen by PT/OT because she is bedbound at baseline.  Patient does have a caregiver at home.  On the day of the discharge the patient's vitals were stable, and no other acute medical condition were reported by patient. the patient was felt safe to be discharge at Home with Home health.  Consultants: None Procedures: None  Discharge Exam: General: Appear in no distress, Oral Mucosa Clear, moist. Cardiovascular: S1 and S2 Present, no Murmur, Respiratory: normal respiratory effort, Bilateral Air entry present and no Crackles, no wheezes Abdomen: Bowel Sound present, Soft and no tenderness. Extremities: no Pedal edema, no calf tenderness Neurology: alert and oriented to time, place, and person affect appropriate.  Filed Weights   02/09/24 0748  Weight: 73.6 kg   Vitals:   02/12/24 0454 02/12/24 0817  BP: (!) 151/71 136/71  Pulse: 77 99  Resp: (!) 22 20  Temp: 98.2 F (36.8 C) 98.4 F (36.9 C)  SpO2: 99% 96%    DISCHARGE MEDICATION: Allergies as of 02/12/2024       Reactions   Alendronate Other (See Comments)   Nervousness and shakiness per pt   Codeine    dizziness        Medication List     STOP taking these medications    loperamide  2 MG capsule Commonly known as: IMODIUM        TAKE these medications    acetaminophen  325 MG tablet Commonly known as: TYLENOL  Take 2 tablets (650 mg total) by mouth every 6 (six) hours as needed for mild pain (or Fever >/= 101).   albuterol  108 (90 Base) MCG/ACT inhaler Commonly known as: VENTOLIN  HFA Inhale 2 puffs into the lungs every 4 (four) hours as needed for wheezing or shortness of breath.   amLODipine  10 MG tablet Commonly known as: NORVASC  Take 10 mg by mouth daily.   bisacodyl 10 MG suppository Commonly known as: DULCOLAX Place 1 suppository (10 mg total) rectally daily as needed for severe constipation.   bisacodyl 5 MG EC  tablet Commonly known as: DULCOLAX Take 2 tablets (10 mg total) by mouth at bedtime.   busPIRone  10 MG tablet Commonly known as: BUSPAR  Take 10 mg by mouth 2 (two) times daily.   carbamide peroxide 6.5 % OTIC solution Commonly known as: DEBROX Place 5 drops into the right ear 2 (two) times daily for 3 days.   enalapril  10 MG tablet Commonly known as: VASOTEC  Take 10 mg by mouth 2 (two) times daily.   feeding supplement Liqd Take 237 mLs by mouth 2 (two) times daily between meals.   fexofenadine 180 MG tablet Commonly known as: ALLEGRA Take 180 mg by mouth daily.   furosemide  20 MG tablet Commonly known as: LASIX  Take 20 mg by mouth daily as needed.   metoprolol  tartrate 25 MG tablet Commonly known as: LOPRESSOR  Take 1 tablet (25 mg total) by mouth 2 (two) times daily.   naproxen  500 MG tablet Commonly known as: NAPROSYN  Take 500 mg by mouth 2 (two) times daily.   polyethylene glycol 17 g packet  Commonly known as: MIRALAX  / GLYCOLAX  Take 17 g by mouth 2 (two) times daily. Start taking on: February 13, 2024   sulfamethoxazole -trimethoprim  800-160 MG tablet Commonly known as: BACTRIM  DS Take 1 tablet by mouth 2 (two) times daily for 5 days.   timolol  0.5 % ophthalmic gel-forming Commonly known as: TIMOPTIC -XR Place 1 drop into both eyes at bedtime.               Durable Medical Equipment  (From admission, onward)           Start     Ordered   02/10/24 1056  For home use only DME Hospital bed  Once       Question Answer Comment  Length of Need Lifetime   Bed type Semi-electric      02/10/24 1055           Allergies  Allergen Reactions   Alendronate Other (See Comments)    Nervousness and shakiness per pt   Codeine     dizziness   Discharge Instructions     Call MD for:  difficulty breathing, headache or visual disturbances   Complete by: As directed    Call MD for:  extreme fatigue   Complete by: As directed    Call MD for:   persistant dizziness or light-headedness   Complete by: As directed    Call MD for:  persistant nausea and vomiting   Complete by: As directed    Call MD for:  severe uncontrolled pain   Complete by: As directed    Call MD for:  temperature >100.4   Complete by: As directed    Diet - low sodium heart healthy   Complete by: As directed    Discharge instructions   Complete by: As directed    F/u with PCP in 1 wk Monitor BP and Titrate medications accordingly Continue to use laxatives to prevent fecal impaction and avoid imodium  use. Use debrox ear drop for 3-5 days every month to prevent cerumen plug  Repeat CBC and BMP in 1-2 weeks   Increase activity slowly   Complete by: As directed        The results of significant diagnostics from this hospitalization (including imaging, microbiology, ancillary and laboratory) are listed below for reference.    Significant Diagnostic Studies: DG Abd 1 View Result Date: 02/12/2024 CLINICAL DATA:  Fecal impaction EXAM: ABDOMEN - 1 VIEW COMPARISON:  02/11/2024 FINDINGS: Diffuse gaseous distention of the colon is similar to minimally progressive in the interval. Prominent rectal stool again noted. No gaseous small bowel dilatation. Bones are diffusely demineralized. IMPRESSION: Diffuse gaseous distention of the colon is similar to minimally progressive in the interval. Prominent rectal stool again noted. Electronically Signed   By: Camellia Candle M.D.   On: 02/12/2024 11:20   DG Abd 1 View Result Date: 02/11/2024 CLINICAL DATA:  Fecal impaction. EXAM: ABDOMEN - 1 VIEW COMPARISON:  CT abdomen pelvis dated 02/09/2024. FINDINGS: Large amount of stool throughout the colon. No bowel dilatation or evidence of obstruction. No free air or radiopaque calculi. Osteopenia with degenerative changes of the spine. Partially visualized right femoral neck fixation hardware. No acute osseous pathology. IMPRESSION: Constipation. No bowel obstruction. Electronically  Signed   By: Vanetta Chou M.D.   On: 02/11/2024 11:18   CT ABDOMEN PELVIS W CONTRAST Result Date: 02/09/2024 EXAM: CT ABDOMEN AND PELVIS WITH CONTRAST 02/09/2024 09:04:40 AM TECHNIQUE: CT of the abdomen and pelvis was performed with the administration of 100  mL of iohexol  (OMNIPAQUE ) 300 MG/ML solution. Multiplanar reformatted images are provided for review. Automated exposure control, iterative reconstruction, and/or weight-based adjustment of the mA/kV was utilized to reduce the radiation dose to as low as reasonably achievable. COMPARISON: 08/27/2020 CLINICAL HISTORY: Recurrent vomiting, altered mental status, leukocytosis, mild bilirubin elevation--eval for underlying infection. FINDINGS: LOWER CHEST: Subpleural honeycombing and interstitial reticulation identified within the lung bases suggestive of chronic interstitial lung disease. LIVER: The liver is unremarkable. GALLBLADDER AND BILE DUCTS: Mild distention of the gallbladder. No wall thickening, pericholecystic inflammation or stones noted. Common bile duct measures up to 1 cm, image 44/5. No signs of choledocholithiasis. SPLEEN: The spleen is within normal limits in size and appearance. PANCREAS: Pancreas is normal in size and contour without focal lesion or ductal dilatation. ADRENAL GLANDS: Normal left adrenal gland. Previously noted right adrenal gland adenoma is unchanged measuring 2.1 cm, image 18/2. No follow-up imaging recommended. KIDNEYS, URETERS AND BLADDER: No stones in the kidneys or ureters. No hydronephrosis. No perinephric or periureteral stranding. Urinary bladder is unremarkable. GI AND BOWEL: Stomach demonstrates no acute abnormality. The appendix is not visualized. No secondary signs of acute appendicitis noted. Mild colonic stool burden. Sigmoid diverticulosis without signs of acute diverticulitis. Signs of previous sigmoid resection with anastomotic suture chain in the left lower quadrant of the abdomen. Large volume of  desiccated stool is identified within the rectum. Atherosclerotic calcifications. PERITONEUM AND RETROPERITONEUM: No ascites. No free air. VASCULATURE: Aorta is normal in caliber. LYMPH NODES: No lymphadenopathy. REPRODUCTIVE ORGANS: Status post hysterectomy. No adnexal mass. BONES AND SOFT TISSUES: Ventral abdominal wall hernia repair. Previous open reduction and internal fixation of the right femur. Remote healed left inferior pubic rami fracture. Bones are diffusely osteopenic. Chronic compression fractures are again seen involving the T12, L1, and L5 vertebrae. No focal soft tissue abnormality. IMPRESSION: 1. No acute findings in the abdomen or pelvis. 2. Large volume of desiccated stool in the rectum. Correlate for any clinical signs or symptoms of rectal impaction. Electronically signed by: Waddell Calk MD 02/09/2024 09:42 AM EST RP Workstation: [id number]   CT HEAD WO CONTRAST ( ) Result Date: 02/09/2024 EXAM: CT HEAD WITHOUT CONTRAST 02/09/2024 08:06:21 AM TECHNIQUE: CT of the head was performed without the administration of intravenous contrast. Automated exposure control, iterative reconstruction, and/or weight based adjustment of the mA/kV was utilized to reduce the radiation dose to as low as reasonably achievable. COMPARISON: Brain MRI 10/31/2023. Head CT 10/30/2023. CLINICAL HISTORY: 89 year old female with altered mental status. FINDINGS: BRAIN AND VENTRICLES: No acute hemorrhage. No evidence of acute infarct. Stable brain volume. Nonspecific ventriculomegaly might be ex vacuo in nature. Advanced chronic cerebral white matter disease with hypodensity. Chronic lacunar infarct left corona radiata. No cortical encephalomalacia by CT. Calcified atherosclerosis. No suspicious intracranial vascular hyperdensity. No extra-axial collection. No mass effect or midline shift. ORBITS: No acute abnormality. SINUSES: Left middle ear and mastoid opacification is new and extensive. Negative visible pharynx.  Contralateral right tympanic cavity and mastoids are clear. Paranasal sinuses are better aerated compared to July. SOFT TISSUES AND SKULL: No acute soft tissue abnormality. No skull fracture. No bone erosion or acute osseous abnormality is identified. Chronic skull osteopenia. IMPRESSION: 1. New complete left middle ear and mastoid opacification. Consider acute otitis media with mastoid effusion. No complicating features are evident. Contralateral middle ear and paranasal sinuses are well aerated. 2. No acute intracranial abnormality. Chronic small vessel disease and brain volume loss. Electronically signed by: Helayne Hurst MD 02/09/2024 08:17 AM EST  RP Workstation: [id number]   DG Chest Portable 1 View Result Date: 02/09/2024 EXAM: 1 VIEW(S) XRAY OF THE CHEST 02/09/2024 07:43:36 AM COMPARISON: 10/30/2023 CLINICAL HISTORY: elderly AMS, eval for underlying pna, chronic pulmonary fibrosis suspected. FINDINGS: LUNGS AND PLEURA: pulmonary fibrosis suspected. Chronic lung disease/interstitial coarsening and low lung volumes. Stable coarse interstitial opacity in right upper lobe. No pulmonary edema. No pleural effusion. No pneumothorax. HEART AND MEDIASTINUM: Stable cardiomegaly and mediastinal contours. Calcified aortic atherosclerosis. BONES AND SOFT TISSUES: Osteopenia. IMPRESSION: 1. Chronic lung disease with stable low lung volumes. No superimposed acute cardiopulmonary abnormality. Electronically signed by: Helayne Hurst MD 02/09/2024 07:59 AM EST RP Workstation: [id number]    Microbiology: Recent Results (from the past 240 hours)  Resp panel by RT-PCR (RSV, Flu A&B, Covid) Anterior Nasal Swab     Status: None   Collection Time: 02/09/24  7:29 AM   Specimen: Anterior Nasal Swab  Result Value Ref Range Status   SARS Coronavirus 2 by RT PCR NEGATIVE NEGATIVE Final    Comment: (NOTE) SARS-CoV-2 target nucleic acids are NOT DETECTED.  The SARS-CoV-2 RNA is generally detectable in upper  respiratory specimens during the acute phase of infection. The lowest concentration of SARS-CoV-2 viral copies this assay can detect is 138 copies/mL. A negative result does not preclude SARS-Cov-2 infection and should not be used as the sole basis for treatment or other patient management decisions. A negative result may occur with  improper specimen collection/handling, submission of specimen other than nasopharyngeal swab, presence of viral mutation(s) within the areas targeted by this assay, and inadequate number of viral copies(<138 copies/mL). A negative result must be combined with clinical observations, patient history, and epidemiological information. The expected result is Negative.  Fact Sheet for Patients:  bloggercourse.com  Fact Sheet for Healthcare Providers:  seriousbroker.it  This test is no t yet approved or cleared by the [place]  FDA and  has been authorized for detection and/or diagnosis of SARS-CoV-2 by FDA under an Emergency Use Authorization (EUA). This EUA will remain  in effect (meaning this test can be used) for the duration of the COVID-19 declaration under Section 564(b)(1) of the Act, 21 U.S.C.section 360bbb-3(b)(1), unless the authorization is terminated  or revoked sooner.       Influenza A by PCR NEGATIVE NEGATIVE Final   Influenza B by PCR NEGATIVE NEGATIVE Final    Comment: (NOTE) The Xpert Xpress SARS-CoV-2/FLU/RSV plus assay is intended as an aid in the diagnosis of influenza from Nasopharyngeal swab specimens and should not be used as a sole basis for treatment. Nasal washings and aspirates are unacceptable for Xpert Xpress SARS-CoV-2/FLU/RSV testing.  Fact Sheet for Patients: bloggercourse.com  Fact Sheet for Healthcare Providers: seriousbroker.it  This test is not yet approved or cleared by the [place]  FDA and has been  authorized for detection and/or diagnosis of SARS-CoV-2 by FDA under an Emergency Use Authorization (EUA). This EUA will remain in effect (meaning this test can be used) for the duration of the COVID-19 declaration under Section 564(b)(1) of the Act, 21 U.S.C. section 360bbb-3(b)(1), unless the authorization is terminated or revoked.     Resp Syncytial Virus by PCR NEGATIVE NEGATIVE Final    Comment: (NOTE) Fact Sheet for Patients: bloggercourse.com  Fact Sheet for Healthcare Providers: seriousbroker.it  This test is not yet approved or cleared by the [place]  FDA and has been authorized for detection and/or diagnosis of SARS-CoV-2 by FDA under an Emergency Use Authorization (EUA). This EUA will remain in effect (meaning  this test can be used) for the duration of the COVID-19 declaration under Section 564(b)(1) of the Act, 21 U.S.C. section 360bbb-3(b)(1), unless the authorization is terminated or revoked.  Performed at [facility], [address] [place], [place] [address]   Urine Culture (for pregnant, neutropenic or urologic patients or patients with an indwelling urinary catheter)     Status: Abnormal   Collection Time: 02/09/24  8:36 AM   Specimen: Urine, Clean Catch  Result Value Ref Range Status   Specimen Description   Final    URINE, CLEAN CATCH Performed at [facility], [address], [place], [place] [address]    Special Requests   Final    NONE Performed at [facility], [address] [place], [place] [address]    Culture (A)  Final    >=100,000 COLONIES/mL KLEBSIELLA PNEUMONIAE Confirmed Extended Spectrum Beta-Lactamase Producer (ESBL).  In bloodstream infections from ESBL organisms, carbapenems are preferred over piperacillin /tazobactam. They are shown to have a lower risk of mortality.    Report Status 02/12/2024 FINAL  Final   Organism ID, Bacteria KLEBSIELLA  PNEUMONIAE (A)  Final      Susceptibility   Klebsiella pneumoniae - MIC*    AMPICILLIN >=32 RESISTANT Resistant     CEFAZOLIN  (URINE) Value in next row Resistant      >=32 RESISTANTThis is a modified FDA-approved test that has been validated and its performance characteristics determined by the reporting laboratory.  This laboratory is certified under the Clinical Laboratory Improvement Amendments CLIA as qualified to perform high complexity clinical laboratory testing.    CEFEPIME Value in next row Resistant      >=32 RESISTANTThis is a modified FDA-approved test that has been validated and its performance characteristics determined by the reporting laboratory.  This laboratory is certified under the Clinical Laboratory Improvement Amendments CLIA as qualified to perform high complexity clinical laboratory testing.    ERTAPENEM Value in next row Sensitive      >=32 RESISTANTThis is a modified FDA-approved test that has been validated and its performance characteristics determined by the reporting laboratory.  This laboratory is certified under the Clinical Laboratory Improvement Amendments CLIA as qualified to perform high complexity clinical laboratory testing.    CEFTRIAXONE  Value in next row Resistant      >=32 RESISTANTThis is a modified FDA-approved test that has been validated and its performance characteristics determined by the reporting laboratory.  This laboratory is certified under the Clinical Laboratory Improvement Amendments CLIA as qualified to perform high complexity clinical laboratory testing.    CIPROFLOXACIN Value in next row Resistant      >=32 RESISTANTThis is a modified FDA-approved test that has been validated and its performance characteristics determined by the reporting laboratory.  This laboratory is certified under the Clinical Laboratory Improvement Amendments CLIA as qualified to perform high complexity clinical laboratory testing.    GENTAMICIN Value in next row  Resistant      >=32 RESISTANTThis is a modified FDA-approved test that has been validated and its performance characteristics determined by the reporting laboratory.  This laboratory is certified under the Clinical Laboratory Improvement Amendments CLIA as qualified to perform high complexity clinical laboratory testing.    NITROFURANTOIN Value in next row Intermediate      >=32 RESISTANTThis is a modified FDA-approved test that has been validated and its performance characteristics determined by the reporting laboratory.  This laboratory is certified under the Clinical Laboratory Improvement Amendments CLIA as qualified to perform high complexity  clinical laboratory testing.    TRIMETH /SULFA  Value in next row Sensitive      >=32 RESISTANTThis is a modified FDA-approved test that has been validated and its performance characteristics determined by the reporting laboratory.  This laboratory is certified under the Clinical Laboratory Improvement Amendments CLIA as qualified to perform high complexity clinical laboratory testing.    AMPICILLIN/SULBACTAM Value in next row Resistant      >=32 RESISTANTThis is a modified FDA-approved test that has been validated and its performance characteristics determined by the reporting laboratory.  This laboratory is certified under the Clinical Laboratory Improvement Amendments CLIA as qualified to perform high complexity clinical laboratory testing.    PIP/TAZO Value in next row Sensitive      16 SENSITIVEThis is a modified FDA-approved test that has been validated and its performance characteristics determined by the reporting laboratory.  This laboratory is certified under the Clinical Laboratory Improvement Amendments CLIA as qualified to perform high complexity clinical laboratory testing.    MEROPENEM Value in next row Sensitive      16 SENSITIVEThis is a modified FDA-approved test that has been validated and its performance characteristics determined by the  reporting laboratory.  This laboratory is certified under the Clinical Laboratory Improvement Amendments CLIA as qualified to perform high complexity clinical laboratory testing.    * >=100,000 COLONIES/mL KLEBSIELLA PNEUMONIAE     Labs: CBC: Recent Labs  Lab 02/09/24 0722 02/10/24 0553 02/11/24 0535 02/12/24 0609  WBC 13.4* 10.2 9.8 11.6*  HGB 13.2 11.8* 12.2 12.8  HCT 38.6 36.2 36.8 38.3  MCV 92.8 96.0 96.3 93.9  PLT 285 252 258 320   Basic Metabolic Panel: Recent Labs  Lab 02/09/24 0722 02/10/24 0553 02/11/24 0535 02/12/24 0609  NA 138 142 139 136  K 2.9* 3.2* 4.4 4.3  CL 97* 104 103 98  CO2 26 29 27 24   GLUCOSE 132* 92 92 128*  BUN 9 8 12 12   CREATININE 0.67 0.71 0.57 0.58  CALCIUM  10.5* 9.7 9.9 10.6*  MG 1.8 1.8 2.0 2.1  PHOS 1.3* 3.2 2.3* 2.9   Liver Function Tests: Recent Labs  Lab 02/09/24 0722  AST 17  ALT 10  ALKPHOS 75  BILITOT 1.4*  PROT 7.0  ALBUMIN 3.6   No results for input(s): LIPASE, AMYLASE in the last 168 hours. Recent Labs  Lab 02/09/24 0729  AMMONIA 20   Cardiac Enzymes: No results for input(s): CKTOTAL, CKMB, CKMBINDEX, TROPONINI in the last 168 hours. BNP (last 3 results) Recent Labs    09/16/23 1634 10/30/23 1616  BNP 37.8 156.4*   CBG: No results for input(s): GLUCAP in the last 168 hours.  Time spent: 35 minutes  Signed:  Elvan Sor  [facility] 02/12/2024 12:03 PM

## 2024-02-12 NOTE — TOC Progression Note (Signed)
 Transition of Care [facility]) - Progression Note    Patient Details  Name: Virginia Clements MRN: [id number] Date of Birth: [birth year]  Transition of Care [facility] CM/SW Contact  Lorraine LILLETTE [place], [place] Phone Number: 02/12/2024, 12:46 PM  Clinical Narrative:    Advance from RN that DO signed and pt will need transport home,  pt is bedfast.  CSW advised that she will reach out to family verifying they are home to receive pt once I verify DO signed.  Call to sister- no answer, Call to caretaker Breck Breck states that family is always home and EMS transports frequently and can access the home.  Caretaker will be there by 2:30PM but requested that pt be placed on the list now for transport home- this happens all the time.   Added pt to [facility] list for transport to home address and advised RN via [place]. No further ICM needs.      Barriers to Discharge: No Barriers Identified               Expected Discharge Plan and Services         Expected Discharge Date: 02/12/24                                     Social Drivers of Health (SDOH) Interventions SDOH Screenings   Food Insecurity: Patient Unable To Answer (02/09/2024)  Housing: Unknown (02/09/2024)  Transportation Needs: Patient Unable To Answer (02/09/2024)  Utilities: Patient Unable To Answer (02/09/2024)  Financial Resource Strain: Low Risk  (05/31/2018)  Physical Activity: Unknown (05/31/2018)  Social Connections: Patient Unable To Answer (02/09/2024)  Stress: No Stress Concern Present (05/31/2018)  Tobacco Use: Medium Risk (02/09/2024)    Readmission Risk Interventions     No data to display
